# Patient Record
Sex: Female | Born: 1970 | Race: Black or African American | Hispanic: No | Marital: Single | Smoking: Never smoker
Health system: Southern US, Community
[De-identification: ages and names within clinical notes are randomized; demographics above are authoritative.]

## PROBLEM LIST (undated history)

## (undated) DIAGNOSIS — E119 Type 2 diabetes mellitus without complications: Secondary | ICD-10-CM

## (undated) DIAGNOSIS — G4733 Obstructive sleep apnea (adult) (pediatric): Secondary | ICD-10-CM

## (undated) DIAGNOSIS — K219 Gastro-esophageal reflux disease without esophagitis: Secondary | ICD-10-CM

## (undated) DIAGNOSIS — G43909 Migraine, unspecified, not intractable, without status migrainosus: Secondary | ICD-10-CM

## (undated) DIAGNOSIS — A749 Chlamydial infection, unspecified: Secondary | ICD-10-CM

## (undated) DIAGNOSIS — Z973 Presence of spectacles and contact lenses: Secondary | ICD-10-CM

## (undated) DIAGNOSIS — J302 Other seasonal allergic rhinitis: Secondary | ICD-10-CM

## (undated) DIAGNOSIS — I10 Essential (primary) hypertension: Secondary | ICD-10-CM

## (undated) DIAGNOSIS — Z9989 Dependence on other enabling machines and devices: Secondary | ICD-10-CM

## (undated) DIAGNOSIS — E785 Hyperlipidemia, unspecified: Secondary | ICD-10-CM

## (undated) HISTORY — DX: Essential (primary) hypertension: I10

## (undated) HISTORY — PX: PLANTAR FASCIA SURGERY: SHX746

## (undated) HISTORY — PX: TUBAL LIGATION: SHX77

## (undated) HISTORY — PX: FOOT SURGERY: SHX648

## (undated) HISTORY — DX: Type 2 diabetes mellitus without complications: E11.9

## (undated) HISTORY — DX: Chlamydial infection, unspecified: A74.9

## (undated) HISTORY — DX: Migraine, unspecified, not intractable, without status migrainosus: G43.909

## (undated) HISTORY — PX: DILATION AND CURETTAGE OF UTERUS: SHX78

## (undated) HISTORY — DX: Hyperlipidemia, unspecified: E78.5

## (undated) HISTORY — DX: Obstructive sleep apnea (adult) (pediatric): G47.33

## (undated) HISTORY — DX: Dependence on other enabling machines and devices: Z99.89

## (undated) HISTORY — PX: COLONOSCOPY: SHX174

---

## 1999-05-27 ENCOUNTER — Other Ambulatory Visit: Admission: RE | Admit: 1999-05-27 | Discharge: 1999-05-27 | Payer: Self-pay | Admitting: Gynecology

## 2006-06-05 ENCOUNTER — Encounter: Admission: RE | Admit: 2006-06-05 | Discharge: 2006-06-05 | Payer: Self-pay | Admitting: Occupational Medicine

## 2006-09-05 ENCOUNTER — Other Ambulatory Visit: Admission: RE | Admit: 2006-09-05 | Discharge: 2006-09-05 | Payer: Self-pay | Admitting: Family Medicine

## 2006-11-16 ENCOUNTER — Encounter: Admission: RE | Admit: 2006-11-16 | Discharge: 2006-11-16 | Payer: Self-pay | Admitting: Family Medicine

## 2007-01-16 ENCOUNTER — Encounter (HOSPITAL_COMMUNITY): Admission: RE | Admit: 2007-01-16 | Discharge: 2007-02-15 | Payer: Self-pay | Admitting: Podiatry

## 2008-04-21 ENCOUNTER — Encounter
Admission: RE | Admit: 2008-04-21 | Discharge: 2008-04-21 | Payer: Self-pay | Admitting: Physical Medicine and Rehabilitation

## 2008-09-02 ENCOUNTER — Observation Stay (HOSPITAL_COMMUNITY): Admission: EM | Admit: 2008-09-02 | Discharge: 2008-09-03 | Payer: Self-pay | Admitting: Emergency Medicine

## 2009-05-04 ENCOUNTER — Ambulatory Visit: Payer: Self-pay | Admitting: Cardiology

## 2009-05-04 ENCOUNTER — Observation Stay (HOSPITAL_COMMUNITY): Admission: EM | Admit: 2009-05-04 | Discharge: 2009-05-05 | Payer: Self-pay | Admitting: Emergency Medicine

## 2009-05-05 ENCOUNTER — Encounter: Payer: Self-pay | Admitting: Cardiology

## 2009-08-13 ENCOUNTER — Encounter: Admission: RE | Admit: 2009-08-13 | Discharge: 2009-08-13 | Payer: Self-pay | Admitting: Internal Medicine

## 2010-04-14 DIAGNOSIS — R079 Chest pain, unspecified: Secondary | ICD-10-CM

## 2010-05-17 LAB — CBC
Hemoglobin: 12 g/dL (ref 12.0–15.0)
MCHC: 32.3 g/dL (ref 30.0–36.0)
MCV: 80.5 fL (ref 78.0–100.0)
Platelets: 319 10*3/uL (ref 150–400)
RBC: 4.29 MIL/uL (ref 3.87–5.11)
RBC: 4.69 MIL/uL (ref 3.87–5.11)
RDW: 14.9 % (ref 11.5–15.5)
WBC: 10.3 10*3/uL (ref 4.0–10.5)

## 2010-05-17 LAB — BASIC METABOLIC PANEL
BUN: 11 mg/dL (ref 6–23)
CO2: 27 mEq/L (ref 19–32)
Chloride: 105 mEq/L (ref 96–112)
Creatinine, Ser: 0.88 mg/dL (ref 0.4–1.2)
GFR calc non Af Amer: >60 mL/min (ref >60)
Glucose, Bld: 117 mg/dL — ABNORMAL HIGH (ref 70–99)
Potassium: 3.7 mEq/L (ref 3.5–5.1)

## 2010-05-17 LAB — D-DIMER, QUANTITATIVE: D-Dimer, Quant: 0.25 ug/mL-FEU (ref 0.00–0.48)

## 2010-05-17 LAB — HEPARIN LEVEL (UNFRACTIONATED)
Heparin Unfractionated: 0.12 IU/mL — ABNORMAL LOW (ref 0.30–0.70)
Heparin Unfractionated: 0.25 IU/mL — ABNORMAL LOW (ref 0.30–0.70)
Heparin Unfractionated: <0.1 IU/mL — ABNORMAL LOW (ref 0.30–0.70)

## 2010-05-17 LAB — TROPONIN I: Troponin I: 0.01 ng/mL (ref 0.00–0.06)

## 2010-05-17 LAB — POCT CARDIAC MARKERS

## 2010-05-31 LAB — DIFFERENTIAL
Basophils Absolute: 0 10*3/uL (ref 0.0–0.1)
Basophils Relative: 0 % (ref 0–1)
Eosinophils Relative: 1 % (ref 0–5)
Lymphocytes Relative: 20 % (ref 12–46)
Monocytes Absolute: 0.4 10*3/uL (ref 0.1–1.0)
Neutro Abs: 8.2 10*3/uL — ABNORMAL HIGH (ref 1.7–7.7)

## 2010-05-31 LAB — POCT CARDIAC MARKERS
Myoglobin, poc: 71.8 ng/mL (ref 12–200)
Troponin i, poc: <0.05 ng/mL (ref 0.00–0.09)
Troponin i, poc: <0.05 ng/mL (ref 0.00–0.09)

## 2010-05-31 LAB — D-DIMER, QUANTITATIVE: D-Dimer, Quant: 0.33 ug/mL-FEU (ref 0.00–0.48)

## 2010-05-31 LAB — URINALYSIS, ROUTINE W REFLEX MICROSCOPIC
Bilirubin Urine: NEGATIVE
Glucose, UA: NEGATIVE mg/dL
Hgb urine dipstick: NEGATIVE
Ketones, ur: NEGATIVE mg/dL
Nitrite: NEGATIVE
Specific Gravity, Urine: 1.018 (ref 1.005–1.030)
pH: 6.5 (ref 5.0–8.0)

## 2010-05-31 LAB — PREGNANCY, URINE: Preg Test, Ur: NEGATIVE

## 2010-05-31 LAB — POCT I-STAT, CHEM 8
Calcium, Ion: 1.15 mmol/L (ref 1.12–1.32)
Chloride: 103 mEq/L (ref 96–112)
Glucose, Bld: 129 mg/dL — ABNORMAL HIGH (ref 70–99)
HCT: 43 % (ref 36.0–46.0)
Hemoglobin: 14.6 g/dL (ref 12.0–15.0)

## 2010-05-31 LAB — CBC
MCHC: 32.6 g/dL (ref 30.0–36.0)
Platelets: 314 10*3/uL (ref 150–400)
RBC: 4.97 MIL/uL (ref 3.87–5.11)

## 2010-05-31 LAB — URINE MICROSCOPIC-ADD ON

## 2011-03-08 DIAGNOSIS — R072 Precordial pain: Secondary | ICD-10-CM

## 2011-06-30 ENCOUNTER — Encounter: Payer: Self-pay | Admitting: Gastroenterology

## 2011-07-20 ENCOUNTER — Ambulatory Visit: Payer: 59 | Admitting: Gastroenterology

## 2011-07-27 ENCOUNTER — Telehealth: Payer: Self-pay | Admitting: Gastroenterology

## 2011-07-27 NOTE — Telephone Encounter (Signed)
Message copied by Arna Snipe on Wed Jul 27, 2011  8:19 AM ------      Message from: Donata Duff      Created: Wed Jul 20, 2011  3:18 PM       Do not bill

## 2011-10-21 ENCOUNTER — Encounter (HOSPITAL_COMMUNITY): Payer: Self-pay | Admitting: Pharmacist

## 2011-10-21 ENCOUNTER — Other Ambulatory Visit: Payer: Self-pay | Admitting: Obstetrics and Gynecology

## 2011-10-26 ENCOUNTER — Other Ambulatory Visit: Payer: Self-pay | Admitting: Obstetrics and Gynecology

## 2011-10-28 ENCOUNTER — Encounter (HOSPITAL_COMMUNITY): Payer: Self-pay

## 2011-10-28 ENCOUNTER — Encounter (HOSPITAL_COMMUNITY)
Admission: RE | Admit: 2011-10-28 | Discharge: 2011-10-28 | Disposition: A | Discharge status: Home / Self Care | Payer: 59 | Source: Ambulatory Visit | Attending: Obstetrics and Gynecology | Admitting: Obstetrics and Gynecology

## 2011-10-28 LAB — CBC
HCT: 37.4 % (ref 36.0–46.0)
MCV: 78.6 fL (ref 78.0–100.0)
RBC: 4.76 MIL/uL (ref 3.87–5.11)
RDW: 14.9 % (ref 11.5–15.5)
WBC: 8.3 10*3/uL (ref 4.0–10.5)

## 2011-10-28 NOTE — Patient Instructions (Addendum)
20 Bridget Anderson  10/28/2011   Your procedure is scheduled on:  10/31/11  Enter through the Main Entrance of [facility] at 1030 AM.  Pick up the phone at the desk and dial [phone].   Call this number if you have problems the morning of surgery: [phone]   Remember:   Do not eat food:After Midnight.  Do not drink clear liquids: After Midnight.  Take these medicines the morning of surgery with A SIP OF WATER: Blood pressure medication   Do not wear jewelry, make-up or nail polish.  Do not wear lotions, powders, or perfumes. You may wear deodorant.  Do not shave 48 hours prior to surgery.  Do not bring valuables to the hospital.  Contacts, dentures or bridgework may not be worn into surgery.  Leave suitcase in the car. After surgery it may be brought to your room.  For patients admitted to the hospital, checkout time is 11:00 AM the day of discharge.   Patients discharged the day of surgery will not be allowed to drive home.  Name and phone number of your driver: friend Ocie Cornfield   [phone]  Special Instructions: CHG Shower Use Special Wash: 1/2 bottle night before surgery and 1/2 bottle morning of surgery.   Please read over the following fact sheets that you were given: Surgical Site Infection Prevention

## 2011-10-31 ENCOUNTER — Ambulatory Visit (HOSPITAL_COMMUNITY)
Admission: RE | Admit: 2011-10-31 | Discharge: 2011-10-31 | Disposition: A | Discharge status: Home / Self Care | Payer: 59 | Source: Ambulatory Visit | Attending: Obstetrics and Gynecology | Admitting: Obstetrics and Gynecology

## 2011-10-31 ENCOUNTER — Encounter (HOSPITAL_COMMUNITY): Payer: Self-pay | Admitting: Anesthesiology

## 2011-10-31 ENCOUNTER — Encounter (HOSPITAL_COMMUNITY): Payer: Self-pay | Admitting: *Deleted

## 2011-10-31 ENCOUNTER — Ambulatory Visit (HOSPITAL_COMMUNITY): Payer: 59 | Admitting: Anesthesiology

## 2011-10-31 ENCOUNTER — Encounter (HOSPITAL_COMMUNITY)
Admission: RE | Disposition: A | Discharge status: Home / Self Care | Payer: Self-pay | Source: Ambulatory Visit | Attending: Obstetrics and Gynecology

## 2011-10-31 DIAGNOSIS — Z01818 Encounter for other preprocedural examination: Secondary | ICD-10-CM | POA: Insufficient documentation

## 2011-10-31 DIAGNOSIS — Z01812 Encounter for preprocedural laboratory examination: Secondary | ICD-10-CM | POA: Insufficient documentation

## 2011-10-31 DIAGNOSIS — N920 Excessive and frequent menstruation with regular cycle: Secondary | ICD-10-CM | POA: Insufficient documentation

## 2011-10-31 HISTORY — PX: HYSTEROSCOPY WITH D & C: SHX1775

## 2011-10-31 LAB — BASIC METABOLIC PANEL
CO2: 26 mEq/L (ref 19–32)
Creatinine, Ser: 0.89 mg/dL (ref 0.50–1.10)
GFR calc Af Amer: >90 mL/min (ref >90)
GFR calc non Af Amer: 79 mL/min — ABNORMAL LOW (ref >90)
Glucose, Bld: 140 mg/dL — ABNORMAL HIGH (ref 70–99)

## 2011-10-31 SURGERY — DILATATION AND CURETTAGE /HYSTEROSCOPY
Anesthesia: General

## 2011-10-31 MED ORDER — MIDAZOLAM HCL 5 MG/5ML IJ SOLN
INTRAMUSCULAR | Status: DC | PRN
  Administered 2011-10-31: 2 mg via INTRAVENOUS

## 2011-10-31 MED ORDER — FENTANYL CITRATE 0.05 MG/ML IJ SOLN
INTRAMUSCULAR | Status: DC | PRN
  Administered 2011-10-31: 100 ug via INTRAVENOUS

## 2011-10-31 MED ORDER — DEXAMETHASONE SODIUM PHOSPHATE 10 MG/ML IJ SOLN
INTRAMUSCULAR | Status: DC | PRN
  Administered 2011-10-31: 10 mg via INTRAVENOUS

## 2011-10-31 MED ORDER — LIDOCAINE HCL 1 % IJ SOLN
INTRAMUSCULAR | Status: DC | PRN
  Administered 2011-10-31: 10 mL

## 2011-10-31 MED ORDER — ONDANSETRON HCL 4 MG/2ML IJ SOLN
INTRAMUSCULAR | Status: DC | PRN
  Administered 2011-10-31: 4 mg via INTRAVENOUS

## 2011-10-31 MED ORDER — FENTANYL CITRATE 0.05 MG/ML IJ SOLN
INTRAMUSCULAR | Status: AC
  Filled 2011-10-31: qty 2

## 2011-10-31 MED ORDER — OXYCODONE-ACETAMINOPHEN 5-325 MG PO TABS
1.0000 | ORAL_TABLET | ORAL | Status: DC | PRN
  Administered 2011-10-31: 1 via ORAL

## 2011-10-31 MED ORDER — LACTATED RINGERS IV SOLN
INTRAVENOUS | Status: DC
  Administered 2011-10-31 (×2): via INTRAVENOUS

## 2011-10-31 MED ORDER — PROPOFOL 10 MG/ML IV EMUL
INTRAVENOUS | Status: AC
  Filled 2011-10-31: qty 20

## 2011-10-31 MED ORDER — KETOROLAC TROMETHAMINE 30 MG/ML IJ SOLN
INTRAMUSCULAR | Status: AC
  Filled 2011-10-31: qty 1

## 2011-10-31 MED ORDER — IPRATROPIUM BROMIDE 0.02 % IN SOLN
0.5000 mg | Freq: Once | RESPIRATORY_TRACT | Status: DC
  Filled 2011-10-31: qty 2.5

## 2011-10-31 MED ORDER — LIDOCAINE HCL (CARDIAC) 20 MG/ML IV SOLN
INTRAVENOUS | Status: DC | PRN
  Administered 2011-10-31: 80 mg via INTRAVENOUS

## 2011-10-31 MED ORDER — OXYCODONE-ACETAMINOPHEN 5-325 MG PO TABS
ORAL_TABLET | ORAL | Status: AC
  Administered 2011-10-31: 1 via ORAL
  Filled 2011-10-31: qty 1

## 2011-10-31 MED ORDER — LIDOCAINE HCL (CARDIAC) 20 MG/ML IV SOLN
INTRAVENOUS | Status: AC
  Filled 2011-10-31: qty 5

## 2011-10-31 MED ORDER — ALBUTEROL SULFATE (5 MG/ML) 0.5% IN NEBU
INHALATION_SOLUTION | RESPIRATORY_TRACT | Status: AC
  Administered 2011-10-31: 2.5 mg via RESPIRATORY_TRACT
  Filled 2011-10-31: qty 0.5

## 2011-10-31 MED ORDER — PROPOFOL 10 MG/ML IV BOLUS
INTRAVENOUS | Status: DC | PRN
  Administered 2011-10-31: 150 mg via INTRAVENOUS

## 2011-10-31 MED ORDER — ALBUTEROL SULFATE (5 MG/ML) 0.5% IN NEBU
2.5000 mg | INHALATION_SOLUTION | RESPIRATORY_TRACT | Status: DC
  Administered 2011-10-31: 2.5 mg via RESPIRATORY_TRACT

## 2011-10-31 MED ORDER — DEXAMETHASONE SODIUM PHOSPHATE 10 MG/ML IJ SOLN
INTRAMUSCULAR | Status: AC
  Filled 2011-10-31: qty 1

## 2011-10-31 MED ORDER — ONDANSETRON HCL 4 MG/2ML IJ SOLN
INTRAMUSCULAR | Status: AC
  Filled 2011-10-31: qty 2

## 2011-10-31 MED ORDER — KETOROLAC TROMETHAMINE 30 MG/ML IJ SOLN
INTRAMUSCULAR | Status: DC | PRN
  Administered 2011-10-31: 30 mg via INTRAVENOUS

## 2011-10-31 MED ORDER — MIDAZOLAM HCL 2 MG/2ML IJ SOLN
INTRAMUSCULAR | Status: AC
  Filled 2011-10-31: qty 2

## 2011-10-31 MED ORDER — IBUPROFEN 800 MG PO TABS
ORAL_TABLET | ORAL | Status: AC
  Filled 2011-10-31: qty 1

## 2011-10-31 SURGICAL SUPPLY — 17 items
CANISTER SUCTION 2500CC (MISCELLANEOUS) ×2 IMPLANT
CATH ROBINSON RED A/P 16FR (CATHETERS) ×2 IMPLANT
CLOTH BEACON ORANGE TIMEOUT ST (SAFETY) ×2 IMPLANT
CONTAINER PREFILL 10% NBF 60ML (FORM) ×4 IMPLANT
DRESSING TELFA 8X3 (GAUZE/BANDAGES/DRESSINGS) ×2 IMPLANT
ELECT REM PT RETURN 9FT ADLT (ELECTROSURGICAL) ×2
ELECTRODE REM PT RTRN 9FT ADLT (ELECTROSURGICAL) ×1 IMPLANT
GLOVE BIO SURGEON STRL SZ 6.5 (GLOVE) ×2 IMPLANT
GLOVE BIOGEL PI IND STRL 6.5 (GLOVE) ×2 IMPLANT
GLOVE BIOGEL PI INDICATOR 6.5 (GLOVE) ×2
GOWN PREVENTION PLUS LG XLONG (DISPOSABLE) ×2 IMPLANT
GOWN STRL REIN XL XLG (GOWN DISPOSABLE) ×2 IMPLANT
LOOP ANGLED CUTTING 22FR (CUTTING LOOP) IMPLANT
PACK HYSTEROSCOPY LF (CUSTOM PROCEDURE TRAY) ×2 IMPLANT
PAD OB MATERNITY 4.3X12.25 (PERSONAL CARE ITEMS) ×2 IMPLANT
TOWEL OR 17X24 6PK STRL BLUE (TOWEL DISPOSABLE) ×4 IMPLANT
WATER STERILE IRR 1000ML POUR (IV SOLUTION) ×2 IMPLANT

## 2011-10-31 NOTE — Anesthesia Preprocedure Evaluation (Signed)
Anesthesia Evaluation  Patient identified by MRN, date of birth, ID band Patient awake    Reviewed: Allergy & Precautions, H&P , NPO status , Patient's Chart, lab work & pertinent test results, reviewed documented beta blocker date and time   Airway Mallampati: II TM Distance: >3 FB Neck ROM: Full    Dental No notable dental hx. (+) Teeth Intact   Pulmonary neg pulmonary ROS,  breath sounds clear to auscultation  Pulmonary exam normal       Cardiovascular hypertension, Pt. on medications and Pt. on home beta blockers + dysrhythmias Rhythm:Regular Rate:Normal     Neuro/Psych negative neurological ROS  negative psych ROS   GI/Hepatic negative GI ROS, Neg liver ROS,   Endo/Other  Hyperlipidemia  Renal/GU   negative genitourinary   Musculoskeletal negative musculoskeletal ROS (+)   Abdominal Normal abdominal exam  (+) + obese,   Peds  Hematology negative hematology ROS (+)   Anesthesia Other Findings   Reproductive/Obstetrics negative OB ROS                           Anesthesia Physical Anesthesia Plan  ASA: II  Anesthesia Plan: General   Post-op Pain Management:    Induction: Intravenous  Airway Management Planned: LMA  Additional Equipment:   Intra-op Plan:   Post-operative Plan: Extubation in OR  Informed Consent: I have reviewed the patients History and Physical, chart, labs and discussed the procedure including the risks, benefits and alternatives for the proposed anesthesia with the patient or authorized representative who has indicated his/her understanding and acceptance.   Dental advisory given  Plan Discussed with: CRNA, Anesthesiologist and Surgeon  Anesthesia Plan Comments:         Anesthesia Quick Evaluation

## 2011-10-31 NOTE — H&P (Signed)
41 y.o. yo complains of irregular and sometimes heavy menses that has worsened over the past year.  Pt first seen 10/12 for f/u of LLQ pain evaluated in the ER.  At that time no CMT was noted and pelvic US showed multiple small fibroids and normal ovaries.  She stated that she usually has regular menses but the prior month she had 2 cycles and they were heavier with cramping.  TSH was normal. PAP done showed +CT that was treated and retest was negative.  Pt declined any further treatment for bleeding at that time, but was referred on for eval by GI for rectal bleeding for which she never followed up. She presented again 8/13 with c/o continued irregular and prolonged vaginal bleeding for several months.  Given change in menses over past year recommended D&C hysteroscopy for further eval prior to treatment.  Past Medical History  Diagnosis Date  . Chlamydia   . Hypertension   . Dysrhythmia      palpatations, seen at [facility] for BP regulation   Past Surgical History  Procedure Date  . Foot surgery   . Tubal ligation     History   Social History  . Marital Status: Single    Spouse Name: N/A    Number of Children: N/A  . Years of Education: N/A   Occupational History  . Not on file.   Social History Main Topics  . Smoking status: Never Smoker   . Smokeless tobacco: Not on file  . Alcohol Use: Yes     wine   rarely  . Drug Use: No  . Sexually Active:    Other Topics Concern  . Not on file   Social History Narrative  . No narrative on file    No current facility-administered medications on file prior to encounter.   Current Outpatient Prescriptions on File Prior to Encounter  Medication Sig Dispense Refill  . carvedilol (COREG) 12.5 MG tablet Take 12.5 mg by mouth 2 (two) times daily with a meal.      . simvastatin (ZOCOR) 20 MG tablet Take 20 mg by mouth daily.        Allergies  Allergen Reactions  . Shellfish Allergy Anaphylaxis    Allergic to some shellfish but all  regular fish.    @VITALS2 @  Lungs: clear to ascultation Cor:  RRR Abdomen:  soft, nontender, nondistended. Ex:  no cords, erythema Pelvic:  Def to OR  A: menometrorrhagia   P  All risks, benefits and alternatives d/w patient and she desires to proceed with above .  Philip Aspen

## 2011-10-31 NOTE — Progress Notes (Signed)
Patient ready to go home.  C/o tightness in chest like she needs her inhaler.  Inhaler is at home and she lives 1 hour away.  Dr Arby Barrette notified and order received.  Sats 98% on RA.  Patient given albuterol nebulizer treatment before d/c.

## 2011-10-31 NOTE — Anesthesia Postprocedure Evaluation (Signed)
  Anesthesia Post-op Note  Patient: Bridget Anderson  Procedure(s) Performed: Procedure(s) (LRB) with comments: DILATATION AND CURETTAGE /HYSTEROSCOPY (N/A)  Patient Location: PACU  Anesthesia Type: General  Level of Consciousness: awake, alert  and oriented  Airway and Oxygen Therapy: Patient Spontanous Breathing  Post-op Pain: none  Post-op Assessment: Post-op Vital signs reviewed, Patient's Cardiovascular Status Stable, Respiratory Function Stable, Patent Airway, No signs of Nausea or vomiting and Pain level controlled  Post-op Vital Signs: Reviewed and stable  Complications: No apparent anesthesia complications

## 2011-10-31 NOTE — Transfer of Care (Signed)
Immediate Anesthesia Transfer of Care Note  Patient: Bridget Anderson  Procedure(s) Performed: Procedure(s) (LRB) with comments: DILATATION AND CURETTAGE /HYSTEROSCOPY (N/A)  Patient Location: PACU  Anesthesia Type: General  Level of Consciousness: sedated  Airway & Oxygen Therapy: Patient Spontanous Breathing and Patient connected to nasal cannula oxygen  Post-op Assessment: Report given to PACU RN and Post -op Vital signs reviewed and stable  Post vital signs: stable  Complications: No apparent anesthesia complications

## 2011-11-01 ENCOUNTER — Encounter (HOSPITAL_COMMUNITY): Payer: Self-pay | Admitting: Obstetrics and Gynecology

## 2011-11-21 NOTE — Op Note (Signed)
NAME:  Bridget Anderson, Bridget Anderson                       ACCOUNT NO.:  MEDICAL RECORD NO.:  [id number]  LOCATION:                                 FACILITY:  PHYSICIAN:  Philip Aspen, DO    DATE OF BIRTH:  11-06-1970  DATE OF PROCEDURE:  11/18/2011 DATE OF DISCHARGE:                              OPERATIVE REPORT   PREOPERATIVE DIAGNOSIS:  Menorrhagia.  POSTOPERATIVE DIAGNOSIS:  Menorrhagia.  PROCEDURE:  D and C, hysteroscopy.  SURGEON:  Philip Aspen, DO  ANESTHESIA:  IV sedation with 1% lidocaine for paracervical block.  FLUIDS:  See anesthesia report for details.  ESTIMATED BLOOD LOSS:  Minimal.  FINDINGS:  Normal sized uterus with minimal-to-moderate tissue removed by curettage.  No other uterine abnormalities noted.  COMPLICATIONS:  None.  CONDITION:  Stable to PACU.  DESCRIPTION OF PROCEDURE:  The patient was taken to the operating room after procedure was discussed.  Risks, benefits, expectations, possible side effects and complications were reviewed.  The patient verified consent.  She was taken to the operating room and IV sedation was administered and found to be adequate.  She was then prepped and draped in a normal sterile fashion in the dorsal lithotomy position.  A speculum was placed and the patient's uterus in the anterior lip of cervix was grasped with single-tooth tenaculum.  The speculum was removed, and weighted speculum was placed in the posterior aspect of the vagina.  The cervix was then serially dilated to 25 Pratt and the hysteroscope was then introduced, and ostia and cavity of the uterus were observed.  Hysteroscope was removed and gentle curettage of all 4 quadrants was performed to removal of endometrial tissue that was sent to Pathology for review.  All instruments were then removed from the cervix and vagina.  Hemostasis was noted at tenaculum site.  The patient tolerated the procedure well.  Sponge, lap, and needle counts were correct x2.  The  patient was taken to recovery in stable condition.          ______________________________ Philip Aspen, DO     [place]/[facility]  D:  11/18/2011  T:  11/19/2011  Job:  [id number]

## 2011-11-29 ENCOUNTER — Other Ambulatory Visit: Payer: Self-pay

## 2012-01-04 ENCOUNTER — Other Ambulatory Visit: Payer: Self-pay | Admitting: Orthopedic Surgery

## 2012-01-12 ENCOUNTER — Encounter (HOSPITAL_BASED_OUTPATIENT_CLINIC_OR_DEPARTMENT_OTHER): Payer: Self-pay | Admitting: *Deleted

## 2012-01-12 NOTE — Progress Notes (Signed)
Pt compliant cpap-to bring dos-will use post op

## 2012-01-12 NOTE — Progress Notes (Signed)
Pt had D?C 9/13-hx palpitations-saw Dr Shirlee Latch 3/11-stress echo done-normal-meds for palpitations and htn-saw cardiology [facility] x1-no tests Will need istat

## 2012-01-16 ENCOUNTER — Encounter (HOSPITAL_BASED_OUTPATIENT_CLINIC_OR_DEPARTMENT_OTHER)
Admission: RE | Disposition: A | Discharge status: Home / Self Care | Payer: Self-pay | Source: Ambulatory Visit | Attending: Orthopedic Surgery

## 2012-01-16 ENCOUNTER — Encounter (HOSPITAL_BASED_OUTPATIENT_CLINIC_OR_DEPARTMENT_OTHER): Payer: Self-pay | Admitting: Anesthesiology

## 2012-01-16 ENCOUNTER — Ambulatory Visit (HOSPITAL_BASED_OUTPATIENT_CLINIC_OR_DEPARTMENT_OTHER)
Admission: RE | Admit: 2012-01-16 | Discharge: 2012-01-16 | Disposition: A | Discharge status: Home / Self Care | Payer: 59 | Source: Ambulatory Visit | Attending: Orthopedic Surgery | Admitting: Orthopedic Surgery

## 2012-01-16 ENCOUNTER — Encounter (HOSPITAL_BASED_OUTPATIENT_CLINIC_OR_DEPARTMENT_OTHER): Payer: Self-pay

## 2012-01-16 ENCOUNTER — Ambulatory Visit (HOSPITAL_BASED_OUTPATIENT_CLINIC_OR_DEPARTMENT_OTHER): Payer: 59 | Admitting: Anesthesiology

## 2012-01-16 DIAGNOSIS — K219 Gastro-esophageal reflux disease without esophagitis: Secondary | ICD-10-CM | POA: Insufficient documentation

## 2012-01-16 DIAGNOSIS — G5600 Carpal tunnel syndrome, unspecified upper limb: Secondary | ICD-10-CM | POA: Insufficient documentation

## 2012-01-16 DIAGNOSIS — G4730 Sleep apnea, unspecified: Secondary | ICD-10-CM | POA: Insufficient documentation

## 2012-01-16 DIAGNOSIS — I10 Essential (primary) hypertension: Secondary | ICD-10-CM | POA: Insufficient documentation

## 2012-01-16 HISTORY — DX: Other seasonal allergic rhinitis: J30.2

## 2012-01-16 HISTORY — DX: Presence of spectacles and contact lenses: Z97.3

## 2012-01-16 HISTORY — PX: CARPAL TUNNEL RELEASE: SHX101

## 2012-01-16 HISTORY — DX: Gastro-esophageal reflux disease without esophagitis: K21.9

## 2012-01-16 LAB — POCT I-STAT, CHEM 8
Chloride: 107 mEq/L (ref 96–112)
Creatinine, Ser: 0.9 mg/dL (ref 0.50–1.10)
Glucose, Bld: 89 mg/dL (ref 70–99)
Potassium: 3.9 mEq/L (ref 3.5–5.1)

## 2012-01-16 SURGERY — CARPAL TUNNEL RELEASE
Anesthesia: General | Site: Hand | Laterality: Left | Wound class: Clean

## 2012-01-16 MED ORDER — ONDANSETRON HCL 4 MG/2ML IJ SOLN
INTRAMUSCULAR | Status: DC | PRN
  Administered 2012-01-16: 4 mg via INTRAVENOUS

## 2012-01-16 MED ORDER — LACTATED RINGERS IV SOLN
INTRAVENOUS | Status: DC
  Administered 2012-01-16 (×2): via INTRAVENOUS

## 2012-01-16 MED ORDER — CEFAZOLIN SODIUM-DEXTROSE 2-3 GM-% IV SOLR
2.0000 g | INTRAVENOUS | Status: AC
  Administered 2012-01-16: 2 g via INTRAVENOUS

## 2012-01-16 MED ORDER — HYDROMORPHONE HCL PF 1 MG/ML IJ SOLN
0.2500 mg | INTRAMUSCULAR | Status: DC | PRN
  Administered 2012-01-16 (×2): 0.5 mg via INTRAVENOUS

## 2012-01-16 MED ORDER — BUPIVACAINE HCL (PF) 0.5 % IJ SOLN
INTRAMUSCULAR | Status: DC | PRN
  Administered 2012-01-16: 10 mL

## 2012-01-16 MED ORDER — DEXAMETHASONE SODIUM PHOSPHATE 10 MG/ML IJ SOLN
INTRAMUSCULAR | Status: DC | PRN
  Administered 2012-01-16: 10 mg via INTRAVENOUS

## 2012-01-16 MED ORDER — EPHEDRINE SULFATE 50 MG/ML IJ SOLN
INTRAMUSCULAR | Status: DC | PRN
  Administered 2012-01-16: 10 mg via INTRAVENOUS

## 2012-01-16 MED ORDER — ONDANSETRON HCL 4 MG/2ML IJ SOLN
4.0000 mg | Freq: Once | INTRAMUSCULAR | Status: AC
  Administered 2012-01-16: 4 mg via INTRAVENOUS

## 2012-01-16 MED ORDER — LIDOCAINE HCL (CARDIAC) 20 MG/ML IV SOLN
INTRAVENOUS | Status: DC | PRN
  Administered 2012-01-16: 60 mg via INTRAVENOUS

## 2012-01-16 MED ORDER — POVIDONE-IODINE 7.5 % EX SOLN: Freq: Once | CUTANEOUS | Status: DC

## 2012-01-16 MED ORDER — HYDROCODONE-ACETAMINOPHEN 5-325 MG PO TABS: ORAL_TABLET | ORAL | Status: DC

## 2012-01-16 MED ORDER — FENTANYL CITRATE 0.05 MG/ML IJ SOLN
INTRAMUSCULAR | Status: DC | PRN
  Administered 2012-01-16: 100 ug via INTRAVENOUS

## 2012-01-16 MED ORDER — PROPOFOL 10 MG/ML IV BOLUS
INTRAVENOUS | Status: DC | PRN
  Administered 2012-01-16: 150 mg via INTRAVENOUS

## 2012-01-16 MED ORDER — MIDAZOLAM HCL 5 MG/5ML IJ SOLN
INTRAMUSCULAR | Status: DC | PRN
  Administered 2012-01-16: 2 mg via INTRAVENOUS

## 2012-01-16 SURGICAL SUPPLY — 39 items
BANDAGE COBAN STERILE 2 (GAUZE/BANDAGES/DRESSINGS) ×2 IMPLANT
BANDAGE CONFORM 2  STR LF (GAUZE/BANDAGES/DRESSINGS) ×2 IMPLANT
BLADE SURG 15 STRL LF DISP TIS (BLADE) ×1 IMPLANT
BLADE SURG 15 STRL SS (BLADE) ×2
BNDG CMPR 9X4 STRL LF SNTH (GAUZE/BANDAGES/DRESSINGS) ×1
BNDG ESMARK 4X9 LF (GAUZE/BANDAGES/DRESSINGS) ×1 IMPLANT
CHLORAPREP W/TINT 26ML (MISCELLANEOUS) ×2 IMPLANT
CLOTH BEACON ORANGE TIMEOUT ST (SAFETY) ×2 IMPLANT
CORDS BIPOLAR (ELECTRODE) IMPLANT
COVER TABLE BACK 60X90 (DRAPES) ×2 IMPLANT
DECANTER SPIKE VIAL GLASS SM (MISCELLANEOUS) IMPLANT
DRAPE EXTREMITY T 121X128X90 (DRAPE) ×2 IMPLANT
DRAPE SURG 17X23 STRL (DRAPES) ×2 IMPLANT
DRAPE U 20/CS (DRAPES) ×2 IMPLANT
DRSG EMULSION OIL 3X3 NADH (GAUZE/BANDAGES/DRESSINGS) ×2 IMPLANT
GLOVE BIO SURGEON STRL SZ 6.5 (GLOVE) ×1 IMPLANT
GLOVE BIO SURGEON STRL SZ7 (GLOVE) ×2 IMPLANT
GLOVE BIO SURGEON STRL SZ7.5 (GLOVE) ×2 IMPLANT
GLOVE BIOGEL PI IND STRL 7.0 (GLOVE) ×1 IMPLANT
GLOVE BIOGEL PI IND STRL 8 (GLOVE) ×1 IMPLANT
GLOVE BIOGEL PI INDICATOR 7.0 (GLOVE) ×2
GLOVE BIOGEL PI INDICATOR 8 (GLOVE) ×1
GOWN PREVENTION PLUS XLARGE (GOWN DISPOSABLE) ×3 IMPLANT
GOWN PREVENTION PLUS XXLARGE (GOWN DISPOSABLE) ×2 IMPLANT
NDL HYPO 25X1 1.5 SAFETY (NEEDLE) IMPLANT
NEEDLE HYPO 25X1 1.5 SAFETY (NEEDLE) ×2 IMPLANT
NS IRRIG 1000ML POUR BTL (IV SOLUTION) ×2 IMPLANT
PACK BASIN DAY SURGERY FS (CUSTOM PROCEDURE TRAY) ×2 IMPLANT
PADDING CAST ABS 4INX4YD NS (CAST SUPPLIES) ×1
PADDING CAST ABS COTTON 4X4 ST (CAST SUPPLIES) ×1 IMPLANT
SPONGE GAUZE 4X4 12PLY (GAUZE/BANDAGES/DRESSINGS) ×2 IMPLANT
STOCKINETTE 4X48 STRL (DRAPES) ×2 IMPLANT
SUT ETHILON 4 0 PS 2 18 (SUTURE) ×2 IMPLANT
SYR BULB 3OZ (MISCELLANEOUS) ×2 IMPLANT
SYR CONTROL 10ML LL (SYRINGE) ×1 IMPLANT
TOWEL OR 17X24 6PK STRL BLUE (TOWEL DISPOSABLE) ×2 IMPLANT
TOWEL OR NON WOVEN STRL DISP B (DISPOSABLE) ×2 IMPLANT
UNDERPAD 30X30 INCONTINENT (UNDERPADS AND DIAPERS) ×2 IMPLANT
WATER STERILE IRR 1000ML POUR (IV SOLUTION) IMPLANT

## 2012-01-16 NOTE — Anesthesia Preprocedure Evaluation (Signed)
Anesthesia Evaluation  Patient identified by MRN, date of birth, ID band Patient awake    Reviewed: Allergy & Precautions, H&P , NPO status , Patient's Chart, lab work & pertinent test results, reviewed documented beta blocker date and time   Airway Mallampati: II TM Distance: >3 FB Neck ROM: Full    Dental No notable dental hx. (+) Teeth Intact and Dental Advisory Given   Pulmonary sleep apnea ,  breath sounds clear to auscultation  Pulmonary exam normal       Cardiovascular hypertension, On Home Beta Blockers + dysrhythmias Rhythm:Regular Rate:Normal     Neuro/Psych negative neurological ROS  negative psych ROS   GI/Hepatic Neg liver ROS, GERD-  Medicated and Controlled,  Endo/Other  negative endocrine ROS  Renal/GU negative Renal ROS  negative genitourinary   Musculoskeletal   Abdominal   Peds  Hematology negative hematology ROS (+)   Anesthesia Other Findings   Reproductive/Obstetrics negative OB ROS                           Anesthesia Physical Anesthesia Plan  ASA: III  Anesthesia Plan: General   Post-op Pain Management:    Induction: Intravenous  Airway Management Planned: LMA  Additional Equipment:   Intra-op Plan:   Post-operative Plan: Extubation in OR  Informed Consent: I have reviewed the patients History and Physical, chart, labs and discussed the procedure including the risks, benefits and alternatives for the proposed anesthesia with the patient or authorized representative who has indicated his/her understanding and acceptance.   Dental advisory given  Plan Discussed with: CRNA  Anesthesia Plan Comments:         Anesthesia Quick Evaluation

## 2012-01-16 NOTE — Transfer of Care (Signed)
Immediate Anesthesia Transfer of Care Note  Patient: Bridget Anderson  Procedure(s) Performed: Procedure(s) (LRB) with comments: CARPAL TUNNEL RELEASE (Left)  Patient Location: PACU  Anesthesia Type:General  Level of Consciousness: sedated  Airway & Oxygen Therapy: Patient Spontanous Breathing and Patient connected to face mask oxygen  Post-op Assessment: Report given to PACU RN and Post -op Vital signs reviewed and stable  Post vital signs: Reviewed and stable  Complications: No apparent anesthesia complications

## 2012-01-16 NOTE — Addendum Note (Signed)
Addendum  created 01/16/12 1607 by Raiford Simmonds, MD   Modules edited:Orders

## 2012-01-16 NOTE — H&P (Addendum)
Bridget Anderson is an 41 y.o. female.   Chief Complaint: L hand N/T/Pain HPI: L moderate carpal tunnel syndrome, failed conservative treatment.  Past Medical History  Diagnosis Date  . Chlamydia   . Hypertension   . Sleep apnea     uses a cpap  . Seasonal allergies   . Dysrhythmia      palpatations, seen at [facility] for BP regulation  . GERD (gastroesophageal reflux disease)   . Wears glasses     Past Surgical History  Procedure Date  . Foot surgery   . Tubal ligation   . Vaginal delivery 1995,  2003  . Hysteroscopy w/d&c 10/31/2011    Procedure: DILATATION AND CURETTAGE /HYSTEROSCOPY;  Surgeon: Philip Aspen, DO;  Location: [facility]S;  Service: Gynecology;  Laterality: N/A;  . Colonoscopy   . Dilation and curettage of uterus     History reviewed. No pertinent family history. Social History:  reports that she has never smoked. She does not have any smokeless tobacco history on file. She reports that she drinks alcohol. She reports that she does not use illicit drugs.  Allergies:  Allergies  Allergen Reactions  . Shellfish Allergy Anaphylaxis    Allergic to some shellfish but all regular fish.    Medications Prior to Admission  Medication Sig Dispense Refill  . albuterol (PROVENTIL HFA;VENTOLIN HFA) 108 (90 BASE) MCG/ACT inhaler Inhale 2 puffs into the lungs every 6 (six) hours as needed.      . budesonide-formoterol (SYMBICORT) 160-4.5 MCG/ACT inhaler Inhale 2 puffs into the lungs 2 (two) times daily.      . carvedilol (COREG) 12.5 MG tablet Take 12.5 mg by mouth 2 (two) times daily with a meal.      . montelukast (SINGULAIR) 10 MG tablet Take 10 mg by mouth at bedtime.      . simvastatin (ZOCOR) 20 MG tablet Take 20 mg by mouth daily.      . Vitamin D, Ergocalciferol, (DRISDOL) 50000 UNITS CAPS Take 50,000 Units by mouth every 7 (seven) days. Mondays        Results for orders placed during the hospital encounter of 01/16/12 (from the past 48 hour(s))  POCT I-STAT, CHEM 8      Status: Normal   Collection Time   01/16/12  1:18 PM      Component Value Range Comment   Sodium 141  135 - 145 mEq/L    Potassium 3.9  3.5 - 5.1 mEq/L    Chloride 107  96 - 112 mEq/L    BUN 13  6 - 23 mg/dL    Creatinine, Ser 0.98  0.50 - 1.10 mg/dL    Glucose, Bld 89  70 - 99 mg/dL    Calcium, Ion 1.19  1.47 - 1.23 mmol/L    TCO2 22  0 - 100 mmol/L    Hemoglobin 14.3  12.0 - 15.0 g/dL    HCT 82.9  56.2 - 13.0 %    No results found.  Review of Systems  All other systems reviewed and are negative.    Blood pressure 165/99, pulse 75, temperature 98.2 F (36.8 C), temperature source Oral, resp. rate 18, height 5\' 6"  (1.676 m), weight 97.07 kg (214 lb), last menstrual period 12/01/2011, SpO2 100.00%. Physical Exam  Constitutional: She is oriented to person, place, and time. She appears well-developed and well-nourished.  HENT:  Head: Atraumatic.  Eyes: EOM are normal.  Cardiovascular: Intact distal pulses.   Respiratory: Effort normal.  Musculoskeletal:  Pos durkins compression test L hand/wrist  Neurological: She is alert and oriented to person, place, and time.  Skin: Skin is warm and dry.  Psychiatric: She has a normal mood and affect.     Assessment/Plan L moderate carpal tunnel syndrome, failed conservative treatment. Plan L carpal tunnel release Risks / benefits of surgery discussed Consent on chart  NPO for OR Preop antibiotics   Othell Diluzio WILLIAM 01/16/2012, 1:27 PM

## 2012-01-16 NOTE — Anesthesia Procedure Notes (Signed)
Procedure Name: LMA Insertion Date/Time: 01/16/2012 2:01 PM Performed by: Burna Cash Pre-anesthesia Checklist: Patient identified, Emergency Drugs available, Suction available and Patient being monitored Patient Re-evaluated:Patient Re-evaluated prior to inductionOxygen Delivery Method: Circle System Utilized Preoxygenation: Pre-oxygenation with 100% oxygen Intubation Type: IV induction Ventilation: Mask ventilation without difficulty LMA: LMA inserted LMA Size: 4.0 Number of attempts: 1 Airway Equipment and Method: bite block Placement Confirmation: positive ETCO2 Tube secured with: Tape Dental Injury: Teeth and Oropharynx as per pre-operative assessment

## 2012-01-16 NOTE — Anesthesia Postprocedure Evaluation (Signed)
Anesthesia Post Note  Patient: Bridget Anderson  Procedure(s) Performed: Procedure(s) (LRB): CARPAL TUNNEL RELEASE (Left)  Anesthesia type: General  Patient location: PACU  Post pain: Pain level controlled and Adequate analgesia  Post assessment: Post-op Vital signs reviewed, Patient's Cardiovascular Status Stable, Respiratory Function Stable, Patent Airway and Pain level controlled  Last Vitals:  Filed Vitals:   01/16/12 1515  BP: 156/82  Pulse: 58  Temp:   Resp: 11    Post vital signs: Reviewed and stable  Level of consciousness: awake, alert  and oriented  Complications: No apparent anesthesia complications

## 2012-01-16 NOTE — Op Note (Signed)
Procedure(s): CARPAL TUNNEL RELEASE Procedure Note  Bridget Anderson female 41 y.o. 01/16/2012  Procedure(s) and Anesthesia Type:    * CARPAL TUNNEL RELEASE - General  Surgeon(s) and Role:    * Mable Paris, MD - Primary   Indications:  41 y.o. female with left carpal tunnel syndrome. The patient has findings of moderate carpal tunnel syndrome and has failed nonoperative treatment of bracing.  She did not want injections.     Surgeon: Mable Paris   Assistants: Leana Roe  Anesthesia: General endotracheal anesthesia    Procedure Detail    Findings: The transcarpal ligament was extremely tight and centrally thickened. It was completely released and at the conclusion of the procedure the edges were widely patent. The underlying nerve was healthy-appearing.  Estimated Blood Loss:  Minimal         Drains: none  Blood Given: none         Specimens: none        Complications:  * No complications entered in OR log *  Tourniquet time: Less than 15 min at 250 mmHg         Disposition: PACU - hemodynamically stable.         Condition: stable    Procedure: The patient was brought to the operating room and was placed supine on the operative table.  General anesthesia was used.  A nonsterile tourniquet was applied and the operative extremity was prepped and draped in the standard sterile fashion.  The limb was exsanguinated using an Esmarch dressing and the tourniquet was elevated to 250 mm mercury.  A 3 cm incision was made in line with the web space between the third and fourth ray extending distally from the flexion crease of the wrist. Dissection was carried down through subcutaneous fat to the palmar fascia which was opened longitudinally in line with the incision. Underlying muscle was swept off the transcarpal ligament and a small rent was made in the ligament with a 15 blade. A Freer elevator was then inserted proximally and distally to protect  the contents of the carpal canal while a 15 blade was used under direct visualization to open proximally and distally. The proximal and distal extents of the transcarpal ligament were then carefully exposed and under direct visualization completely divided using tenotomy scissors. Great care was taken to protect the underlying nerve and distally the palmar arch. The transcarpal ligament was noted to be very tight and thickened centrally.  At the conclusion of the procedure the free edges of the transcarpal ligament or widely separated.  The wound was copiously irrigated with normal saline and subsequently closed in one layer with 4-0 nylon in an interrupted fashion.  Sterile dressings were applied including Adaptic 4 x 4's Kling dressing and a lightly wrapped Coban dressing. The tourniquet was let down. The fingers were pink and warm.  The patient was then awakened transferred to a stretcher and taken to the recovery room in stable condition.  Postoperative plan: Patient will be discharged home today and will followup in 10 days for suture removal and wound check.

## 2012-01-17 ENCOUNTER — Encounter (HOSPITAL_BASED_OUTPATIENT_CLINIC_OR_DEPARTMENT_OTHER): Payer: Self-pay | Admitting: Orthopedic Surgery

## 2012-02-01 ENCOUNTER — Other Ambulatory Visit: Payer: Self-pay | Admitting: Orthopedic Surgery

## 2012-02-13 ENCOUNTER — Encounter (HOSPITAL_BASED_OUTPATIENT_CLINIC_OR_DEPARTMENT_OTHER): Payer: Self-pay | Admitting: *Deleted

## 2012-02-13 NOTE — Progress Notes (Signed)
Pt here 11/13 for lt ctr-did well-will bring cpap-knows to use it post op

## 2012-02-20 ENCOUNTER — Ambulatory Visit (HOSPITAL_BASED_OUTPATIENT_CLINIC_OR_DEPARTMENT_OTHER): Payer: 59 | Admitting: Anesthesiology

## 2012-02-20 ENCOUNTER — Encounter (HOSPITAL_BASED_OUTPATIENT_CLINIC_OR_DEPARTMENT_OTHER): Payer: Self-pay | Admitting: *Deleted

## 2012-02-20 ENCOUNTER — Encounter (HOSPITAL_BASED_OUTPATIENT_CLINIC_OR_DEPARTMENT_OTHER): Payer: Self-pay | Admitting: Anesthesiology

## 2012-02-20 ENCOUNTER — Ambulatory Visit (HOSPITAL_BASED_OUTPATIENT_CLINIC_OR_DEPARTMENT_OTHER)
Admission: RE | Admit: 2012-02-20 | Discharge: 2012-02-20 | Disposition: A | Discharge status: Home / Self Care | Payer: 59 | Source: Ambulatory Visit | Attending: Orthopedic Surgery | Admitting: Orthopedic Surgery

## 2012-02-20 ENCOUNTER — Encounter (HOSPITAL_BASED_OUTPATIENT_CLINIC_OR_DEPARTMENT_OTHER)
Admission: RE | Disposition: A | Discharge status: Home / Self Care | Payer: Self-pay | Source: Ambulatory Visit | Attending: Orthopedic Surgery

## 2012-02-20 DIAGNOSIS — I499 Cardiac arrhythmia, unspecified: Secondary | ICD-10-CM | POA: Insufficient documentation

## 2012-02-20 DIAGNOSIS — G4730 Sleep apnea, unspecified: Secondary | ICD-10-CM | POA: Insufficient documentation

## 2012-02-20 DIAGNOSIS — Z91013 Allergy to seafood: Secondary | ICD-10-CM | POA: Insufficient documentation

## 2012-02-20 DIAGNOSIS — G5600 Carpal tunnel syndrome, unspecified upper limb: Secondary | ICD-10-CM | POA: Insufficient documentation

## 2012-02-20 DIAGNOSIS — I10 Essential (primary) hypertension: Secondary | ICD-10-CM | POA: Insufficient documentation

## 2012-02-20 DIAGNOSIS — K219 Gastro-esophageal reflux disease without esophagitis: Secondary | ICD-10-CM | POA: Insufficient documentation

## 2012-02-20 HISTORY — PX: CARPAL TUNNEL RELEASE: SHX101

## 2012-02-20 LAB — POCT I-STAT, CHEM 8
Chloride: 107 mEq/L (ref 96–112)
Glucose, Bld: 113 mg/dL — ABNORMAL HIGH (ref 70–99)
HCT: 42 % (ref 36.0–46.0)
Hemoglobin: 14.3 g/dL (ref 12.0–15.0)
Potassium: 4 mEq/L (ref 3.5–5.1)
Sodium: 141 mEq/L (ref 135–145)

## 2012-02-20 SURGERY — CARPAL TUNNEL RELEASE
Anesthesia: General | Site: Wrist | Laterality: Right | Wound class: Clean

## 2012-02-20 MED ORDER — MEPERIDINE HCL 25 MG/ML IJ SOLN: 6.2500 mg | INTRAMUSCULAR | Status: DC | PRN

## 2012-02-20 MED ORDER — OXYCODONE HCL 5 MG PO TABS
5.0000 mg | ORAL_TABLET | Freq: Once | ORAL | Status: AC | PRN
  Administered 2012-02-20: 5 mg via ORAL

## 2012-02-20 MED ORDER — HYDROCODONE-ACETAMINOPHEN 5-325 MG PO TABS: ORAL_TABLET | ORAL | Status: DC

## 2012-02-20 MED ORDER — MIDAZOLAM HCL 5 MG/5ML IJ SOLN
INTRAMUSCULAR | Status: DC | PRN
  Administered 2012-02-20: 2 mg via INTRAVENOUS

## 2012-02-20 MED ORDER — ONDANSETRON HCL 4 MG/2ML IJ SOLN
INTRAMUSCULAR | Status: DC | PRN
  Administered 2012-02-20: 4 mg via INTRAVENOUS

## 2012-02-20 MED ORDER — DEXAMETHASONE SODIUM PHOSPHATE 4 MG/ML IJ SOLN
INTRAMUSCULAR | Status: DC | PRN
  Administered 2012-02-20: 10 mg via INTRAVENOUS

## 2012-02-20 MED ORDER — BUPIVACAINE HCL (PF) 0.25 % IJ SOLN
INTRAMUSCULAR | Status: DC | PRN
  Administered 2012-02-20: 10 mL

## 2012-02-20 MED ORDER — HYDROMORPHONE HCL PF 1 MG/ML IJ SOLN
0.2500 mg | INTRAMUSCULAR | Status: DC | PRN
  Administered 2012-02-20 (×2): 0.5 mg via INTRAVENOUS

## 2012-02-20 MED ORDER — OXYCODONE HCL 5 MG/5ML PO SOLN: 5.0000 mg | Freq: Once | ORAL | Status: AC | PRN

## 2012-02-20 MED ORDER — LIDOCAINE HCL (CARDIAC) 20 MG/ML IV SOLN
INTRAVENOUS | Status: DC | PRN
  Administered 2012-02-20: 50 mg via INTRAVENOUS

## 2012-02-20 MED ORDER — LACTATED RINGERS IV SOLN
INTRAVENOUS | Status: DC
  Administered 2012-02-20 (×2): via INTRAVENOUS

## 2012-02-20 MED ORDER — PROPOFOL 10 MG/ML IV BOLUS
INTRAVENOUS | Status: DC | PRN
  Administered 2012-02-20: 100 mg via INTRAVENOUS
  Administered 2012-02-20: 300 mg via INTRAVENOUS
  Administered 2012-02-20: 50 mg via INTRAVENOUS

## 2012-02-20 MED ORDER — CEFAZOLIN SODIUM-DEXTROSE 2-3 GM-% IV SOLR: 2.0000 g | INTRAVENOUS | Status: DC

## 2012-02-20 MED ORDER — POVIDONE-IODINE 7.5 % EX SOLN: Freq: Once | CUTANEOUS | Status: DC

## 2012-02-20 MED ORDER — PROMETHAZINE HCL 25 MG/ML IJ SOLN: 6.2500 mg | INTRAMUSCULAR | Status: DC | PRN

## 2012-02-20 MED ORDER — FENTANYL CITRATE 0.05 MG/ML IJ SOLN
INTRAMUSCULAR | Status: DC | PRN
  Administered 2012-02-20: 100 ug via INTRAVENOUS

## 2012-02-20 SURGICAL SUPPLY — 40 items
BANDAGE COBAN STERILE 2 (GAUZE/BANDAGES/DRESSINGS) ×2 IMPLANT
BANDAGE CONFORM 2  STR LF (GAUZE/BANDAGES/DRESSINGS) ×2 IMPLANT
BLADE SURG 15 STRL LF DISP TIS (BLADE) ×1 IMPLANT
BLADE SURG 15 STRL SS (BLADE) ×2
BNDG CMPR 9X4 STRL LF SNTH (GAUZE/BANDAGES/DRESSINGS) ×1
BNDG ESMARK 4X9 LF (GAUZE/BANDAGES/DRESSINGS) ×1 IMPLANT
CHLORAPREP W/TINT 26ML (MISCELLANEOUS) ×2 IMPLANT
CLOTH BEACON ORANGE TIMEOUT ST (SAFETY) ×2 IMPLANT
CORDS BIPOLAR (ELECTRODE) IMPLANT
COVER TABLE BACK 60X90 (DRAPES) ×2 IMPLANT
CUFF TOURNIQUET SINGLE 18IN (TOURNIQUET CUFF) ×1 IMPLANT
DECANTER SPIKE VIAL GLASS SM (MISCELLANEOUS) IMPLANT
DRAPE EXTREMITY T 121X128X90 (DRAPE) ×2 IMPLANT
DRAPE SURG 17X23 STRL (DRAPES) ×2 IMPLANT
DRAPE U 20/CS (DRAPES) ×2 IMPLANT
DRSG EMULSION OIL 3X3 NADH (GAUZE/BANDAGES/DRESSINGS) ×2 IMPLANT
GLOVE BIO SURGEON STRL SZ7 (GLOVE) ×2 IMPLANT
GLOVE BIO SURGEON STRL SZ7.5 (GLOVE) ×2 IMPLANT
GLOVE BIOGEL M STRL SZ7.5 (GLOVE) ×1 IMPLANT
GLOVE BIOGEL PI IND STRL 7.0 (GLOVE) ×1 IMPLANT
GLOVE BIOGEL PI IND STRL 8 (GLOVE) ×1 IMPLANT
GLOVE BIOGEL PI INDICATOR 7.0 (GLOVE) ×1
GLOVE BIOGEL PI INDICATOR 8 (GLOVE) ×2
GOWN PREVENTION PLUS XLARGE (GOWN DISPOSABLE) ×2 IMPLANT
GOWN PREVENTION PLUS XXLARGE (GOWN DISPOSABLE) ×3 IMPLANT
NDL HYPO 25X1 1.5 SAFETY (NEEDLE) IMPLANT
NEEDLE HYPO 25X1 1.5 SAFETY (NEEDLE) ×2 IMPLANT
NS IRRIG 1000ML POUR BTL (IV SOLUTION) ×2 IMPLANT
PACK BASIN DAY SURGERY FS (CUSTOM PROCEDURE TRAY) ×2 IMPLANT
PADDING CAST ABS 4INX4YD NS (CAST SUPPLIES) ×1
PADDING CAST ABS COTTON 4X4 ST (CAST SUPPLIES) ×1 IMPLANT
SPONGE GAUZE 4X4 12PLY (GAUZE/BANDAGES/DRESSINGS) ×2 IMPLANT
STOCKINETTE 4X48 STRL (DRAPES) ×2 IMPLANT
SUT ETHILON 4 0 PS 2 18 (SUTURE) ×2 IMPLANT
SYR BULB 3OZ (MISCELLANEOUS) ×2 IMPLANT
SYR CONTROL 10ML LL (SYRINGE) ×1 IMPLANT
TOWEL OR 17X24 6PK STRL BLUE (TOWEL DISPOSABLE) ×2 IMPLANT
TOWEL OR NON WOVEN STRL DISP B (DISPOSABLE) ×2 IMPLANT
UNDERPAD 30X30 INCONTINENT (UNDERPADS AND DIAPERS) ×2 IMPLANT
WATER STERILE IRR 1000ML POUR (IV SOLUTION) IMPLANT

## 2012-02-20 NOTE — Transfer of Care (Signed)
Immediate Anesthesia Transfer of Care Note  Patient: Bridget Anderson  Procedure(s) Performed: Procedure(s) (LRB) with comments: CARPAL TUNNEL RELEASE (Right)  Patient Location: PACU  Anesthesia Type:General  Level of Consciousness: sedated  Airway & Oxygen Therapy: Patient Spontanous Breathing and Patient connected to face mask oxygen  Post-op Assessment: Report given to PACU RN and Post -op Vital signs reviewed and stable  Post vital signs: Reviewed and stable  Complications: No apparent anesthesia complications

## 2012-02-20 NOTE — Anesthesia Preprocedure Evaluation (Signed)
Anesthesia Evaluation  Patient identified by MRN, date of birth, ID band Patient awake    Reviewed: Allergy & Precautions, H&P , NPO status , Patient's Chart, lab work & pertinent test results, reviewed documented beta blocker date and time   Airway Mallampati: II TM Distance: >3 FB Neck ROM: Full    Dental No notable dental hx. (+) Teeth Intact and Dental Advisory Given   Pulmonary sleep apnea ,  breath sounds clear to auscultation  Pulmonary exam normal       Cardiovascular hypertension, On Home Beta Blockers + dysrhythmias Rhythm:Regular Rate:Normal     Neuro/Psych negative neurological ROS  negative psych ROS   GI/Hepatic Neg liver ROS, GERD-  Medicated and Controlled,  Endo/Other  negative endocrine ROS  Renal/GU      Musculoskeletal   Abdominal   Peds  Hematology negative hematology ROS (+)   Anesthesia Other Findings   Reproductive/Obstetrics negative OB ROS                           Anesthesia Physical Anesthesia Plan  ASA: II  Anesthesia Plan: General LMA   Post-op Pain Management:    Induction:   Airway Management Planned:   Additional Equipment:   Intra-op Plan:   Post-operative Plan:   Informed Consent:   Plan Discussed with:   Anesthesia Plan Comments:         Anesthesia Quick Evaluation

## 2012-02-20 NOTE — H&P (Signed)
Bridget Anderson is an 41 y.o. female.   Chief Complaint:  N/T and pain R hand and wrist HPI: R carpal tunnel syndrome, failed conservative treatment.  Past Medical History  Diagnosis Date  . Chlamydia   . Hypertension   . Sleep apnea     uses a cpap  . Seasonal allergies   . Dysrhythmia      palpatations, seen at [facility] for BP regulation  . GERD (gastroesophageal reflux disease)   . Wears glasses     Past Surgical History  Procedure Date  . Foot surgery   . Tubal ligation   . Vaginal delivery 1995,  2003  . Hysteroscopy w/d&c 10/31/2011    Procedure: DILATATION AND CURETTAGE /HYSTEROSCOPY;  Surgeon: Philip Aspen, DO;  Location: [facility];  Service: Gynecology;  Laterality: N/A;  . Colonoscopy   . Dilation and curettage of uterus   . Carpal tunnel release 01/16/2012    Procedure: CARPAL TUNNEL RELEASE;  Surgeon: Mable Paris, MD;  Location: [facility];  Service: Orthopedics;  Laterality: Left;    History reviewed. No pertinent family history. Social History:  reports that she has never smoked. She does not have any smokeless tobacco history on file. She reports that she drinks alcohol. She reports that she does not use illicit drugs.  Allergies:  Allergies  Allergen Reactions  . Shellfish Allergy Anaphylaxis    Allergic to some shellfish but all regular fish.    Medications Prior to Admission  Medication Sig Dispense Refill  . albuterol (PROVENTIL HFA;VENTOLIN HFA) 108 (90 BASE) MCG/ACT inhaler Inhale 2 puffs into the lungs every 6 (six) hours as needed.      . budesonide-formoterol (SYMBICORT) 160-4.5 MCG/ACT inhaler Inhale 2 puffs into the lungs 2 (two) times daily.      . carvedilol (COREG) 12.5 MG tablet Take 12.5 mg by mouth 2 (two) times daily with a meal.      . montelukast (SINGULAIR) 10 MG tablet Take 10 mg by mouth at bedtime.      . simvastatin (ZOCOR) 20 MG tablet Take 20 mg by mouth daily.      Marland Kitchen HYDROcodone-acetaminophen (NORCO) 5-325 MG  per tablet 1-2 tablets every 4-6 hours as needed for pain.  30 tablet  0  . Vitamin D, Ergocalciferol, (DRISDOL) 50000 UNITS CAPS Take 50,000 Units by mouth every 7 (seven) days. Mondays        Results for orders placed during the hospital encounter of 02/20/12 (from the past 48 hour(s))  POCT I-STAT, CHEM 8     Status: Abnormal   Collection Time   02/20/12 12:45 PM      Component Value Range Comment   Sodium 141  135 - 145 mEq/L    Potassium 4.0  3.5 - 5.1 mEq/L    Chloride 107  96 - 112 mEq/L    BUN 17  6 - 23 mg/dL    Creatinine, Ser 1.91  0.50 - 1.10 mg/dL    Glucose, Bld 478 (*) 70 - 99 mg/dL    Calcium, Ion 2.95  6.21 - 1.23 mmol/L    TCO2 23  0 - 100 mmol/L    Hemoglobin 14.3  12.0 - 15.0 g/dL    HCT 30.8  65.7 - 84.6 %    No results found.  Review of Systems  All other systems reviewed and are negative.    Blood pressure 171/88, pulse 77, temperature 98.2 F (36.8 C), temperature source Oral, resp. rate 20, height  5\' 6"  (1.676 m), weight 100.699 kg (222 lb), last menstrual period 01/11/2012, SpO2 100.00%. Physical Exam  Constitutional: She is oriented to person, place, and time. She appears well-developed and well-nourished.  HENT:  Head: Atraumatic.  Eyes: EOM are normal.  Cardiovascular: Normal rate.   Respiratory: Effort normal.  Musculoskeletal:       + durkin compression test R  Neurological: She is alert and oriented to person, place, and time.  Skin: Skin is warm and dry.  Psychiatric: She has a normal mood and affect.     Assessment/Plan R carpal tunnel syndrome Plan R carpal tunnel release. Risks / benefits of surgery discussed Consent on chart  NPO for OR Preop antibiotics   Alixandrea Milleson WILLIAM 02/20/2012, 2:05 PM

## 2012-02-20 NOTE — Anesthesia Procedure Notes (Signed)
Procedure Name: LMA Insertion Date/Time: 02/20/2012 2:29 PM Performed by: Caren Macadam Pre-anesthesia Checklist: Patient identified, Emergency Drugs available, Suction available and Patient being monitored Patient Re-evaluated:Patient Re-evaluated prior to inductionOxygen Delivery Method: Circle System Utilized Preoxygenation: Pre-oxygenation with 100% oxygen Intubation Type: IV induction Ventilation: Mask ventilation without difficulty LMA: LMA inserted LMA Size: 4.0 Number of attempts: 1 Airway Equipment and Method: bite block Placement Confirmation: positive ETCO2 Tube secured with: Tape Dental Injury: Teeth and Oropharynx as per pre-operative assessment

## 2012-02-20 NOTE — Anesthesia Postprocedure Evaluation (Signed)
  Anesthesia Post-op Note  Patient: Bridget Anderson  Procedure(s) Performed: Procedure(s) (LRB) with comments: CARPAL TUNNEL RELEASE (Right)  Patient Location: PACU  Anesthesia Type:General  Level of Consciousness: awake  Airway and Oxygen Therapy: Patient Spontanous Breathing  Post-op Pain: mild  Post-op Assessment: Post-op Vital signs reviewed  Post-op Vital Signs: stable  Complications: No apparent anesthesia complications

## 2012-02-20 NOTE — Op Note (Signed)
Procedure(s): CARPAL TUNNEL RELEASE Procedure Note  TAMSEN REIST female 41 y.o. 02/20/2012  Procedure(s) and Anesthesia Type:    * CARPAL TUNNEL RELEASE - General  Surgeon(s) and Role:    * Mable Paris, MD - Primary   Indications:  41 y.o. female with right carpal tunnel syndrome. The patient has findings of moderate carpal tunnel syndrome and has failed nonoperative treatment.    Surgeon: Mable Paris   Assistants: Leana Roe  Anesthesia: General endotracheal anesthesia    Procedure Detail  CARPAL TUNNEL RELEASE  Findings: The transcarpal ligament was noted to be significantly thickened centrally. At the conclusion of the procedure the edges of the ligament or widely patent. The underlying nerve was healthy-appearing.  Estimated Blood Loss:  Minimal         Drains: none  Blood Given: none         Specimens: none        Complications:  * No complications entered in OR log *  Tourniquet time: Approximately 15 min at 250 mmHg         Disposition: PACU - hemodynamically stable.         Condition: stable    Procedure: The patient was brought to the operating room and was placed supine on the operative table.  General anesthesia was used.  A nonsterile tourniquet was applied and the operative extremity was prepped and draped in the standard sterile fashion.  The limb was exsanguinated using an Esmarch dressing and the tourniquet was elevated to 250 mm mercury.  A 3 cm incision was made in line with the web space between the third and fourth ray extending distally from the flexion crease of the wrist. Dissection was carried down through subcutaneous fat to the palmar fascia which was opened longitudinally in line with the incision. Underlying muscle was swept off the transcarpal ligament and a small rent was made in the ligament with a 15 blade. A Freer elevator was then inserted proximally and distally to protect the contents of the carpal  canal while a 15 blade was used under direct visualization to open proximally and distally. The proximal and distal extents of the transcarpal ligament were then carefully exposed and under direct visualization completely divided using tenotomy scissors. Great care was taken to protect the underlying nerve and distally the palmar arch. The transcarpal ligament was noted to be thickened centrally.  At the conclusion of the procedure the free edges of the transcarpal ligament or widely separated.  The wound was copiously irrigated with normal saline and subsequently closed in one layer with 4-0 nylon in an interrupted fashion.  Sterile dressings were applied including Adaptic 4 x 4's Kling dressing and a lightly wrapped Coban dressing. The tourniquet was let down. The fingers were pink and warm.  The patient was then awakened transferred to a stretcher and taken to the recovery room in stable condition.  Postoperative plan: Patient will be discharged home today and will followup in 10 days for suture removal and wound check.

## 2012-02-21 ENCOUNTER — Encounter (HOSPITAL_BASED_OUTPATIENT_CLINIC_OR_DEPARTMENT_OTHER): Payer: Self-pay | Admitting: Orthopedic Surgery

## 2012-10-10 ENCOUNTER — Other Ambulatory Visit: Payer: Self-pay

## 2012-11-30 ENCOUNTER — Other Ambulatory Visit (HOSPITAL_COMMUNITY): Payer: Self-pay | Admitting: Podiatry

## 2012-11-30 DIAGNOSIS — G5751 Tarsal tunnel syndrome, right lower limb: Secondary | ICD-10-CM

## 2012-12-04 ENCOUNTER — Ambulatory Visit (HOSPITAL_COMMUNITY): Payer: 59

## 2012-12-06 ENCOUNTER — Other Ambulatory Visit (HOSPITAL_COMMUNITY): Payer: Self-pay | Admitting: Podiatry

## 2012-12-06 ENCOUNTER — Ambulatory Visit (HOSPITAL_COMMUNITY)
Admission: RE | Admit: 2012-12-06 | Discharge: 2012-12-06 | Disposition: A | Discharge status: Home / Self Care | Payer: 59 | Source: Ambulatory Visit | Attending: Podiatry | Admitting: Podiatry

## 2012-12-06 DIAGNOSIS — M7730 Calcaneal spur, unspecified foot: Secondary | ICD-10-CM | POA: Insufficient documentation

## 2012-12-06 DIAGNOSIS — IMO0002 Reserved for concepts with insufficient information to code with codable children: Secondary | ICD-10-CM

## 2012-12-06 DIAGNOSIS — M25579 Pain in unspecified ankle and joints of unspecified foot: Secondary | ICD-10-CM | POA: Insufficient documentation

## 2012-12-06 DIAGNOSIS — G5751 Tarsal tunnel syndrome, right lower limb: Secondary | ICD-10-CM

## 2012-12-06 DIAGNOSIS — M7750 Other enthesopathy of unspecified foot: Secondary | ICD-10-CM | POA: Insufficient documentation

## 2013-01-24 ENCOUNTER — Ambulatory Visit: Payer: 59

## 2013-01-29 ENCOUNTER — Ambulatory Visit: Payer: 59

## 2013-02-05 ENCOUNTER — Ambulatory Visit: Payer: 59

## 2013-02-12 ENCOUNTER — Ambulatory Visit: Payer: 59

## 2013-03-01 ENCOUNTER — Encounter (HOSPITAL_COMMUNITY): Payer: Self-pay

## 2013-03-11 ENCOUNTER — Inpatient Hospital Stay (HOSPITAL_COMMUNITY): Admission: RE | Admit: 2013-03-11 | Payer: 59 | Source: Ambulatory Visit

## 2013-03-15 ENCOUNTER — Encounter (HOSPITAL_COMMUNITY): Admission: RE | Payer: Self-pay | Source: Ambulatory Visit

## 2013-03-15 ENCOUNTER — Ambulatory Visit (HOSPITAL_COMMUNITY): Admission: RE | Admit: 2013-03-15 | Payer: 59 | Source: Ambulatory Visit | Admitting: Obstetrics and Gynecology

## 2013-03-15 SURGERY — ROBOTIC ASSISTED TOTAL HYSTERECTOMY
Anesthesia: Choice

## 2013-04-29 ENCOUNTER — Other Ambulatory Visit: Payer: Self-pay | Admitting: Urology

## 2013-05-03 ENCOUNTER — Other Ambulatory Visit: Payer: Self-pay | Admitting: Urology

## 2013-06-12 ENCOUNTER — Other Ambulatory Visit: Payer: Self-pay | Admitting: Urology

## 2013-07-02 ENCOUNTER — Encounter (HOSPITAL_COMMUNITY): Payer: Self-pay | Admitting: Pharmacist

## 2013-07-04 ENCOUNTER — Encounter (HOSPITAL_COMMUNITY): Payer: Self-pay | Admitting: *Deleted

## 2013-07-08 ENCOUNTER — Encounter (HOSPITAL_COMMUNITY): Payer: Self-pay

## 2013-07-08 ENCOUNTER — Encounter (HOSPITAL_COMMUNITY)
Admission: RE | Admit: 2013-07-08 | Discharge: 2013-07-08 | Disposition: A | Discharge status: Home / Self Care | Payer: 59 | Source: Ambulatory Visit | Attending: Obstetrics and Gynecology | Admitting: Obstetrics and Gynecology

## 2013-07-08 DIAGNOSIS — Z01810 Encounter for preprocedural cardiovascular examination: Secondary | ICD-10-CM | POA: Insufficient documentation

## 2013-07-08 DIAGNOSIS — Z01812 Encounter for preprocedural laboratory examination: Secondary | ICD-10-CM | POA: Insufficient documentation

## 2013-07-08 LAB — BASIC METABOLIC PANEL
BUN: 15 mg/dL (ref 6–23)
CO2: 25 meq/L (ref 19–32)
Calcium: 8.7 mg/dL (ref 8.4–10.5)
Chloride: 103 mEq/L (ref 96–112)
Creatinine, Ser: 0.78 mg/dL (ref 0.50–1.10)
GFR calc Af Amer: >90 mL/min (ref >90)
GFR calc non Af Amer: >90 mL/min (ref >90)
GLUCOSE: 96 mg/dL (ref 70–99)
POTASSIUM: 3.9 meq/L (ref 3.7–5.3)
SODIUM: 139 meq/L (ref 137–147)

## 2013-07-08 LAB — CBC
HCT: 34.4 % — ABNORMAL LOW (ref 36.0–46.0)
HEMOGLOBIN: 10.9 g/dL — AB (ref 12.0–15.0)
MCH: 24.4 pg — AB (ref 26.0–34.0)
MCHC: 31.7 g/dL (ref 30.0–36.0)
MCV: 77 fL — AB (ref 78.0–100.0)
Platelets: 383 10*3/uL (ref 150–400)
RBC: 4.47 MIL/uL (ref 3.87–5.11)
RDW: 15.3 % (ref 11.5–15.5)
WBC: 9.5 10*3/uL (ref 4.0–10.5)

## 2013-07-08 NOTE — Anesthesia Preprocedure Evaluation (Addendum)
Anesthesia Evaluation    Airway Mallampati: III TM Distance: >3 FB Neck ROM: full   Comment: Goiter - reports difficulty breathing in certain positions (chin on chest), waking up "choking" (when not using CPAP), difficulty breathing when having palpitations, sometimes difficulty swallowing/choking which she relates to her goiter.   Dental no notable dental hx. (+) Teeth Intact   Pulmonary asthma , sleep apnea (has CPAP - inconsistent use) and Continuous Positive Airway Pressure Ventilation ,  breath sounds clear to auscultation  Pulmonary exam normal       Cardiovascular hypertension, On Home Beta Blockers and On Medications + dysrhythmias (palpitations) Rhythm:regular Rate:Normal     Neuro/Psych negative neurological ROS  negative psych ROS   GI/Hepatic Neg liver ROS, GERD-  Medicated,  Endo/Other  diabetes, Type 2, Oral Hypoglycemic AgentsObese - BMI 35.3 Goiter - followed by endocrinology, no meds  Renal/GU negative Renal ROS  Female GU complaint     Musculoskeletal   Abdominal Normal abdominal exam  (+)   Peds  Hematology negative hematology ROS (+)   Anesthesia Other Findings   Reproductive/Obstetrics negative OB ROS                         Anesthesia Physical Anesthesia Plan  ASA: III  Anesthesia Plan: General   Post-op Pain Management:    Induction:   Airway Management Planned:   Additional Equipment:   Intra-op Plan:   Post-operative Plan:   Informed Consent:   Plan Discussed with:   Anesthesia Plan Comments: (Need to contact endocrinologist for U/S results, recommendations regarding goiter/airway involvement and general anesthesia/intubation.  Patient reports being symptomatic from goiter.  Charlton Haws, MD (07/08/13)  Update: Most recent U/S of thyroid showed enlargement from previous U/S, and recommended ENT f/u.  Pt saw ENT on 07/12/13 (Dr Redmond Baseman - pager [phone]) and was  cleared for surgery.  Charlton Haws, MD (07/12/13) )       Anesthesia Quick Evaluation

## 2013-07-08 NOTE — Patient Instructions (Signed)
[place]  07/08/2013   Your procedure is scheduled on:  07/16/13  Enter through the Main Entrance of [facility] at [place] up the phone at the desk and dial [phone].   Call this number if you have problems the morning of surgery: [phone]   Remember:   Do not eat food:After Midnight.  Do not drink clear liquids: After Midnight.  Take these medicines the morning of surgery with A SIP OF WATER: take blood pressure medication, take omeprazole, hold Metformin for 24hrs prior to surgery   Do not wear jewelry, make-up or nail polish.  Do not wear lotions, powders, or perfumes. You may wear deodorant.  Do not shave 48 hours prior to surgery.  Do not bring valuables to the hospital.  [facility] is not   responsible for any belongings or valuables brought to the hospital.  Contacts, dentures or bridgework may not be worn into surgery.  Leave suitcase in the car. After surgery it may be brought to your room.  For patients admitted to the hospital, checkout time is 11:00 AM the day of              discharge.   Patients discharged the day of surgery will not be allowed to drive             home.  Name and phone number of your driver: NA  Special Instructions:      Please read over the following fact sheets that you were given:   Surgical Site Infection Prevention

## 2013-07-09 NOTE — Pre-Procedure Instructions (Signed)
Request x2 to [facility] for thyroid information and testing results per request Dr Primitivo Gauze.

## 2013-07-09 NOTE — Pre-Procedure Instructions (Signed)
Request made [facility] for Thyroid ultrasound of May 6,2015.

## 2013-07-10 NOTE — Pre-Procedure Instructions (Signed)
Pt and MD office Erline Levine) made aware of need for appt with general surgeon.

## 2013-07-10 NOTE — Pre-Procedure Instructions (Signed)
Call into MD office. VM left to advise Bridget Anderson that pt needs to be cleared via a general surgeon prior to surgery due to her enlarging thyroid nodules.

## 2013-07-10 NOTE — Pre-Procedure Instructions (Signed)
Results of 2013 and 06/2013 thyroid ultrasound reviewed by Dr Primitivo Gauze. No orders given at this time.

## 2013-07-12 NOTE — H&P (Signed)
History of Present Illness   Bridget Anderson has mixed stress urge incontinence. She has hourly frequency. She is planning a hysterectomy. She has had some abdominal pressure and heaviness and is known to have some small fibroids. She had a negative cough test and no prolapse. She has moderate nocturia but I did not think she had interstitial cystitis. She did not avoid and was catheterized for 75 mL. Maximum capacity was 200 mL. The bladder was unstable at low volumes. Maximum pressure was 12 cmH2O. She had an after contraction of 53 cmH2O after her second void. She leaked with one that was triggered with a cough, but otherwise could hold them. Her Valsalva leak-point pressure at 200 mL was 9 cmH2O and mild. There was some Valsalva induced instability. During voluntary voiding she voided 132 mL with a maximum flow of 24 mL/sec. Maximum voiding pressure was 53 cmH2O. This was the after contraction. Otherwise, voiding pressure was 21 cmH2O. EMG activity increased during the voiding phase. Bladder neck descended 2 cm. She has multiple low-pressure contractions during the filling phase. The details of the urodynamics are signed and dictated on the urodynamic sheet.   Review of Systems: No change in bowel or neurologic systems.   There was no pain on bladder filling.    Past Medical History Problems  1. History of arthritis (V13.4) 2. History of asthma (V12.69) 3. History of cardiac murmur (V12.59) 4. History of diabetes mellitus (V12.29) 5. History of gastroesophageal reflux (GERD) (V12.79) 6. History of hypercholesterolemia (V12.29) 7. History of hypertension (V12.59) 8. History of Obstructive sleep apnea, adult (327.23)  Surgical History Problems  1. History of Foot Surgery 2. History of Hand Surgery  Current Meds 1. Benazepril HCl - 20 MG Oral Tablet;  Therapy: (Recorded:16Jan2015) to Recorded 2. Carvedilol 12.5 MG Oral Tablet;  Therapy: (Recorded:16Jan2015) to Recorded 3. Glumetza 500 MG  Oral Tablet Extended Release 24 Hour;  Therapy: (Recorded:16Jan2015) to Recorded  Allergies Medication  1. No Known Drug Allergies  Family History Problems  1. Family history of hypertension (V17.49) : Mother, Father, Brother  Social History Problems  1. Alcohol use   wine once a week 2. Caffeine use (V49.89)   3-4 drinks daily 3. Non-smoker (V49.89) 4. Number of children   2 daughters 5. Occupation   Advertising account planner 6. Single  Review of Systems Constitutional, skin, eye, otolaryngeal, hematologic/lymphatic, cardiovascular, pulmonary, endocrine, musculoskeletal, gastrointestinal, neurological and psychiatric system(s) were reviewed and pertinent findings if present are noted.  Genitourinary: no incontinence.    Assessment Assessed  1. Urge and stress incontinence (788.33) 2. Increased urinary frequency (788.41)  Discussion/Summary   I drew Ms. Faller a picture. She understands she has a mild outlet abnormality. She has a moderately severe overactive bladder with frequency, nocturia, and urge incontinence. If she was not having a hysterectomy, I would certainly would recommend medical and behavioral therapy and/or physical therapy. We discussed a sling because she is having a hysterectomy.   We talked about a sling in detail. Pros, cons, general surgical and anesthetic risks, and other options including behavioral therapy and watchful waiting were discussed. She understands that slings are generally successful in 90% of cases for stress incontinence, 50% for urge incontinence, and that in a small percentage of cases the incontinence can worsen. The risk of persistent, de novo, or worsening incontinence/dysfunction was discussed. Risks were described but not limited to the discussion of injury to neighboring structures including the bowel (with possible life-threatening sepsis and colostomy), bladder, urethra, vagina (all  resulting in further surgery), and ureter (resulting in  re-implantation). We also talked about the risk of retention requiring urethrolysis, extrusion requiring revision, and erosion resulting in further surgery. Bleeding risks and transfusion rates and the risk of infection were discussed. The risk of pelvic and abdominal pain syndromes, dyspareunia, and neuropathies were discussed. The need for CIC was described as well as the usual postoperative course. The patient understands that she might not reach her treatment goal and that she might be worse following surgery. Mesh TV issues were discussed.  Persistent or worsening overactive bladder is something to discuss.   Ms. Poplin would like to proceed with a sling.   She asked me more about her left lower-quadrant pain. She understands I do not think she has interstitial cystitis, but we are going to do a hydrodistension simultaneously.  We talked about cystoscopy/hydrodistension and instillation in detail. Pros, cons, general surgical and anesthetic risks, and other options including watchful waiting were discussed. Risks were described but not limited to pain, infection, and bleeding. The risk of bladder perforation and management were discussed. The patient understands that it is primarily a diagnostic procedure.   After a thorough review of the management options for the patient's condition the patient  elected to proceed with surgical therapy as noted above. We have discussed the potential benefits and risks of the procedure, side effects of the proposed treatment, the likelihood of the patient achieving the goals of the procedure, and any potential problems that might occur during the procedure or recuperation. Informed consent has been obtained.

## 2013-07-15 MED ORDER — PHENAZOPYRIDINE HCL 200 MG PO TABS
200.0000 mg | ORAL_TABLET | Freq: Once | ORAL | Status: AC
  Administered 2013-07-16: 200 mg via ORAL
  Filled 2013-07-15: qty 1

## 2013-07-16 ENCOUNTER — Encounter (HOSPITAL_COMMUNITY): Payer: 59 | Admitting: Anesthesiology

## 2013-07-16 ENCOUNTER — Inpatient Hospital Stay (HOSPITAL_COMMUNITY)
Admission: RE | Admit: 2013-07-16 | Discharge: 2013-07-19 | DRG: 743 | Disposition: A | Discharge status: Home / Self Care | Payer: 59 | Source: Ambulatory Visit | Attending: Obstetrics and Gynecology | Admitting: Obstetrics and Gynecology

## 2013-07-16 ENCOUNTER — Encounter (HOSPITAL_COMMUNITY): Payer: Self-pay | Admitting: *Deleted

## 2013-07-16 ENCOUNTER — Ambulatory Visit (HOSPITAL_COMMUNITY): Payer: 59 | Admitting: Anesthesiology

## 2013-07-16 ENCOUNTER — Encounter (HOSPITAL_COMMUNITY)
Admission: RE | Disposition: A | Discharge status: Home / Self Care | Payer: Self-pay | Source: Ambulatory Visit | Attending: Obstetrics and Gynecology

## 2013-07-16 DIAGNOSIS — D251 Intramural leiomyoma of uterus: Secondary | ICD-10-CM | POA: Diagnosis present

## 2013-07-16 DIAGNOSIS — N8000 Endometriosis of the uterus, unspecified: Secondary | ICD-10-CM | POA: Diagnosis present

## 2013-07-16 DIAGNOSIS — I10 Essential (primary) hypertension: Secondary | ICD-10-CM | POA: Diagnosis present

## 2013-07-16 DIAGNOSIS — J45909 Unspecified asthma, uncomplicated: Secondary | ICD-10-CM | POA: Diagnosis present

## 2013-07-16 DIAGNOSIS — N949 Unspecified condition associated with female genital organs and menstrual cycle: Secondary | ICD-10-CM | POA: Diagnosis present

## 2013-07-16 DIAGNOSIS — N920 Excessive and frequent menstruation with regular cycle: Principal | ICD-10-CM | POA: Diagnosis present

## 2013-07-16 DIAGNOSIS — K219 Gastro-esophageal reflux disease without esophagitis: Secondary | ICD-10-CM | POA: Diagnosis present

## 2013-07-16 DIAGNOSIS — D252 Subserosal leiomyoma of uterus: Secondary | ICD-10-CM | POA: Diagnosis present

## 2013-07-16 DIAGNOSIS — R002 Palpitations: Secondary | ICD-10-CM | POA: Diagnosis present

## 2013-07-16 DIAGNOSIS — E669 Obesity, unspecified: Secondary | ICD-10-CM | POA: Diagnosis present

## 2013-07-16 DIAGNOSIS — N3090 Cystitis, unspecified without hematuria: Secondary | ICD-10-CM | POA: Diagnosis present

## 2013-07-16 DIAGNOSIS — Z6835 Body mass index (BMI) 35.0-35.9, adult: Secondary | ICD-10-CM

## 2013-07-16 DIAGNOSIS — N393 Stress incontinence (female) (male): Secondary | ICD-10-CM | POA: Diagnosis present

## 2013-07-16 DIAGNOSIS — E119 Type 2 diabetes mellitus without complications: Secondary | ICD-10-CM | POA: Diagnosis present

## 2013-07-16 DIAGNOSIS — Z90710 Acquired absence of both cervix and uterus: Secondary | ICD-10-CM | POA: Diagnosis present

## 2013-07-16 DIAGNOSIS — G4733 Obstructive sleep apnea (adult) (pediatric): Secondary | ICD-10-CM | POA: Diagnosis present

## 2013-07-16 HISTORY — PX: CYSTO WITH HYDRODISTENSION: SHX5453

## 2013-07-16 HISTORY — PX: BILATERAL SALPINGECTOMY: SHX5743

## 2013-07-16 HISTORY — PX: ABDOMINAL HYSTERECTOMY: SHX81

## 2013-07-16 HISTORY — PX: LAPAROSCOPY: SHX197

## 2013-07-16 HISTORY — PX: PUBOVAGINAL SLING: SHX1035

## 2013-07-16 LAB — TYPE AND SCREEN
ABO/RH(D): A POS
Antibody Screen: NEGATIVE

## 2013-07-16 LAB — PROTIME-INR
INR: 1.05 (ref 0.00–1.49)
Prothrombin Time: 13.5 seconds (ref 11.6–15.2)

## 2013-07-16 LAB — GLUCOSE, CAPILLARY
Glucose-Capillary: 125 mg/dL — ABNORMAL HIGH (ref 70–99)
Glucose-Capillary: 172 mg/dL — ABNORMAL HIGH (ref 70–99)

## 2013-07-16 LAB — APTT: APTT: 31 s (ref 24–37)

## 2013-07-16 LAB — ABO/RH: ABO/RH(D): A POS

## 2013-07-16 SURGERY — HYSTERECTOMY, ABDOMINAL
Anesthesia: General | Site: Vagina

## 2013-07-16 MED ORDER — GLYCOPYRROLATE 0.2 MG/ML IJ SOLN
INTRAMUSCULAR | Status: DC | PRN
  Administered 2013-07-16: .4 mg via INTRAVENOUS
  Administered 2013-07-16: 0.1 mg via INTRAVENOUS
  Administered 2013-07-16: .1 mg via INTRAVENOUS

## 2013-07-16 MED ORDER — CARVEDILOL 12.5 MG PO TABS
12.5000 mg | ORAL_TABLET | Freq: Two times a day (BID) | ORAL | Status: DC
  Administered 2013-07-17 – 2013-07-19 (×4): 12.5 mg via ORAL
  Filled 2013-07-16 (×6): qty 1

## 2013-07-16 MED ORDER — BUPIVACAINE HCL (PF) 0.5 % IJ SOLN
INTRAMUSCULAR | Status: AC
  Filled 2013-07-16: qty 30

## 2013-07-16 MED ORDER — LIDOCAINE-EPINEPHRINE (PF) 1 %-1:200000 IJ SOLN
INTRAMUSCULAR | Status: DC | PRN
  Administered 2013-07-16: 4 mL

## 2013-07-16 MED ORDER — GLYCOPYRROLATE 0.2 MG/ML IJ SOLN
INTRAMUSCULAR | Status: AC
  Filled 2013-07-16: qty 3

## 2013-07-16 MED ORDER — METFORMIN HCL ER 500 MG PO TB24: 500.0000 mg | ORAL_TABLET | Freq: Every day | ORAL | Status: DC

## 2013-07-16 MED ORDER — PANTOPRAZOLE SODIUM 40 MG PO TBEC
40.0000 mg | DELAYED_RELEASE_TABLET | Freq: Every day | ORAL | Status: DC
  Administered 2013-07-16: 40 mg via ORAL

## 2013-07-16 MED ORDER — IBUPROFEN 600 MG PO TABS
600.0000 mg | ORAL_TABLET | Freq: Four times a day (QID) | ORAL | Status: DC | PRN
  Administered 2013-07-17 – 2013-07-19 (×5): 600 mg via ORAL
  Filled 2013-07-16 (×6): qty 1

## 2013-07-16 MED ORDER — DEXAMETHASONE SODIUM PHOSPHATE 10 MG/ML IJ SOLN
INTRAMUSCULAR | Status: DC | PRN
  Administered 2013-07-16: 5 mg via INTRAVENOUS

## 2013-07-16 MED ORDER — MEPERIDINE HCL 25 MG/ML IJ SOLN: 6.2500 mg | INTRAMUSCULAR | Status: DC | PRN

## 2013-07-16 MED ORDER — HYDROMORPHONE HCL PF 1 MG/ML IJ SOLN
INTRAMUSCULAR | Status: AC
  Filled 2013-07-16: qty 1

## 2013-07-16 MED ORDER — FENTANYL CITRATE 0.05 MG/ML IJ SOLN
INTRAMUSCULAR | Status: DC | PRN
  Administered 2013-07-16: 100 ug via INTRAVENOUS
  Administered 2013-07-16: 50 ug via INTRAVENOUS
  Administered 2013-07-16 (×2): 100 ug via INTRAVENOUS

## 2013-07-16 MED ORDER — ONDANSETRON HCL 4 MG PO TABS: 4.0000 mg | ORAL_TABLET | Freq: Four times a day (QID) | ORAL | Status: DC | PRN

## 2013-07-16 MED ORDER — HYDROMORPHONE HCL PF 1 MG/ML IJ SOLN
1.0000 mg | INTRAMUSCULAR | Status: DC | PRN
  Administered 2013-07-16 – 2013-07-17 (×3): 1 mg via INTRAVENOUS
  Filled 2013-07-16 (×4): qty 1

## 2013-07-16 MED ORDER — ROCURONIUM BROMIDE 100 MG/10ML IV SOLN
INTRAVENOUS | Status: DC | PRN
  Administered 2013-07-16: 10 mg via INTRAVENOUS
  Administered 2013-07-16: 20 mg via INTRAVENOUS
  Administered 2013-07-16: 50 mg via INTRAVENOUS

## 2013-07-16 MED ORDER — CEFAZOLIN SODIUM-DEXTROSE 2-3 GM-% IV SOLR
INTRAVENOUS | Status: AC
  Filled 2013-07-16: qty 50

## 2013-07-16 MED ORDER — LACTATED RINGERS IV SOLN
INTRAVENOUS | Status: DC
  Administered 2013-07-16: 10:00:00 via INTRAVENOUS
  Administered 2013-07-16: 1000 mL via INTRAVENOUS
  Administered 2013-07-16: 08:00:00 via INTRAVENOUS

## 2013-07-16 MED ORDER — METFORMIN HCL ER 500 MG PO TB24
500.0000 mg | ORAL_TABLET | Freq: Every day | ORAL | Status: DC
  Administered 2013-07-17 – 2013-07-19 (×2): 500 mg via ORAL
  Filled 2013-07-16 (×4): qty 1

## 2013-07-16 MED ORDER — ONDANSETRON HCL 4 MG/2ML IJ SOLN
INTRAMUSCULAR | Status: AC
  Filled 2013-07-16: qty 2

## 2013-07-16 MED ORDER — GENTAMICIN SULFATE 40 MG/ML IJ SOLN
350.0000 mg | INTRAMUSCULAR | Status: AC
  Administered 2013-07-16: 350 mg via INTRAVENOUS
  Filled 2013-07-16: qty 8.75

## 2013-07-16 MED ORDER — SODIUM CHLORIDE 0.9 % IR SOLN
Freq: Once | Status: DC
  Filled 2013-07-16: qty 1

## 2013-07-16 MED ORDER — HYDROMORPHONE HCL PF 1 MG/ML IJ SOLN
0.2500 mg | INTRAMUSCULAR | Status: DC | PRN
  Administered 2013-07-16: 0.25 mg via INTRAVENOUS

## 2013-07-16 MED ORDER — ONDANSETRON HCL 4 MG/2ML IJ SOLN
INTRAMUSCULAR | Status: DC | PRN
Start: 2013-07-16 — End: 2013-07-16
  Administered 2013-07-16 (×2): 2 mg via INTRAVENOUS

## 2013-07-16 MED ORDER — MIDAZOLAM HCL 2 MG/2ML IJ SOLN
INTRAMUSCULAR | Status: DC | PRN
  Administered 2013-07-16: 1.5 mg via INTRAVENOUS
  Administered 2013-07-16: 0.5 mg via INTRAVENOUS

## 2013-07-16 MED ORDER — PANTOPRAZOLE SODIUM 40 MG PO TBEC
DELAYED_RELEASE_TABLET | ORAL | Status: AC
  Filled 2013-07-16: qty 1

## 2013-07-16 MED ORDER — OXYCODONE-ACETAMINOPHEN 5-325 MG PO TABS
1.0000 | ORAL_TABLET | ORAL | Status: DC | PRN
  Administered 2013-07-17 – 2013-07-18 (×3): 2 via ORAL
  Administered 2013-07-18 – 2013-07-19 (×3): 1 via ORAL
  Administered 2013-07-19: 2 via ORAL
  Filled 2013-07-16: qty 2
  Filled 2013-07-16 (×2): qty 1
  Filled 2013-07-16 (×2): qty 2
  Filled 2013-07-16: qty 1
  Filled 2013-07-16: qty 2

## 2013-07-16 MED ORDER — ROCURONIUM BROMIDE 100 MG/10ML IV SOLN
INTRAVENOUS | Status: AC
  Filled 2013-07-16: qty 1

## 2013-07-16 MED ORDER — DOCUSATE SODIUM 100 MG PO CAPS
100.0000 mg | ORAL_CAPSULE | Freq: Two times a day (BID) | ORAL | Status: DC
Start: 2013-07-16 — End: 2013-07-19
  Administered 2013-07-17 – 2013-07-19 (×4): 100 mg via ORAL
  Filled 2013-07-16 (×6): qty 1

## 2013-07-16 MED ORDER — BUPIVACAINE HCL (PF) 0.5 % IJ SOLN
INTRAMUSCULAR | Status: DC | PRN
  Administered 2013-07-16: 6 mL

## 2013-07-16 MED ORDER — FENTANYL CITRATE 0.05 MG/ML IJ SOLN
INTRAMUSCULAR | Status: AC
  Filled 2013-07-16: qty 5

## 2013-07-16 MED ORDER — CEFAZOLIN SODIUM-DEXTROSE 2-3 GM-% IV SOLR
2.0000 g | Freq: Once | INTRAVENOUS | Status: AC
  Administered 2013-07-16: 2 g via INTRAVENOUS
  Filled 2013-07-16: qty 50

## 2013-07-16 MED ORDER — ESTRADIOL 0.1 MG/GM VA CREA
TOPICAL_CREAM | VAGINAL | Status: DC | PRN
  Administered 2013-07-16: 1 via VAGINAL

## 2013-07-16 MED ORDER — PROPOFOL 10 MG/ML IV BOLUS
INTRAVENOUS | Status: DC | PRN
  Administered 2013-07-16: 150 mg via INTRAVENOUS

## 2013-07-16 MED ORDER — NEOSTIGMINE METHYLSULFATE 10 MG/10ML IV SOLN
INTRAVENOUS | Status: DC | PRN
  Administered 2013-07-16: 3 mg via INTRAVENOUS

## 2013-07-16 MED ORDER — MIDAZOLAM HCL 2 MG/2ML IJ SOLN
INTRAMUSCULAR | Status: AC
  Filled 2013-07-16: qty 2

## 2013-07-16 MED ORDER — CLINDAMYCIN PHOSPHATE 900 MG/50ML IV SOLN: 900.0000 mg | Freq: Once | INTRAVENOUS | Status: DC

## 2013-07-16 MED ORDER — CEFAZOLIN SODIUM-DEXTROSE 2-3 GM-% IV SOLR
2.0000 g | Freq: Three times a day (TID) | INTRAVENOUS | Status: DC
  Filled 2013-07-16 (×6): qty 50

## 2013-07-16 MED ORDER — STERILE WATER FOR IRRIGATION IR SOLN
Status: DC | PRN
  Administered 2013-07-16: 2 via INTRAVESICAL

## 2013-07-16 MED ORDER — KETOROLAC TROMETHAMINE 30 MG/ML IJ SOLN
15.0000 mg | Freq: Once | INTRAMUSCULAR | Status: AC | PRN
  Administered 2013-07-16: 30 mg via INTRAVENOUS

## 2013-07-16 MED ORDER — LIDOCAINE-EPINEPHRINE 1 %-1:100000 IJ SOLN
INTRAMUSCULAR | Status: AC
  Filled 2013-07-16: qty 1

## 2013-07-16 MED ORDER — PANTOPRAZOLE SODIUM 40 MG PO TBEC
40.0000 mg | DELAYED_RELEASE_TABLET | Freq: Every day | ORAL | Status: DC
  Administered 2013-07-17 – 2013-07-19 (×3): 40 mg via ORAL
  Filled 2013-07-16 (×5): qty 1

## 2013-07-16 MED ORDER — PROPOFOL 10 MG/ML IV EMUL
INTRAVENOUS | Status: AC
  Filled 2013-07-16: qty 20

## 2013-07-16 MED ORDER — BENAZEPRIL HCL 20 MG PO TABS
20.0000 mg | ORAL_TABLET | Freq: Two times a day (BID) | ORAL | Status: DC
  Administered 2013-07-16 – 2013-07-19 (×5): 20 mg via ORAL
  Filled 2013-07-16 (×9): qty 1

## 2013-07-16 MED ORDER — KETOROLAC TROMETHAMINE 30 MG/ML IJ SOLN
INTRAMUSCULAR | Status: AC
  Filled 2013-07-16: qty 1

## 2013-07-16 MED ORDER — DEXAMETHASONE SODIUM PHOSPHATE 10 MG/ML IJ SOLN
INTRAMUSCULAR | Status: AC
  Filled 2013-07-16: qty 1

## 2013-07-16 MED ORDER — LACTATED RINGERS IV SOLN
INTRAVENOUS | Status: DC
  Administered 2013-07-16 – 2013-07-18 (×6): via INTRAVENOUS

## 2013-07-16 MED ORDER — ACETAMINOPHEN 10 MG/ML IV SOLN
1000.0000 mg | Freq: Once | INTRAVENOUS | Status: AC
  Administered 2013-07-16: 1000 mg via INTRAVENOUS
  Filled 2013-07-16: qty 100

## 2013-07-16 MED ORDER — ESTRADIOL 0.1 MG/GM VA CREA
TOPICAL_CREAM | VAGINAL | Status: AC
  Filled 2013-07-16: qty 42.5

## 2013-07-16 MED ORDER — LIDOCAINE HCL (CARDIAC) 20 MG/ML IV SOLN
INTRAVENOUS | Status: AC
  Filled 2013-07-16: qty 5

## 2013-07-16 MED ORDER — METOCLOPRAMIDE HCL 5 MG/ML IJ SOLN
INTRAMUSCULAR | Status: AC
  Filled 2013-07-16: qty 2

## 2013-07-16 MED ORDER — ONDANSETRON HCL 4 MG/2ML IJ SOLN
4.0000 mg | Freq: Four times a day (QID) | INTRAMUSCULAR | Status: DC | PRN
  Administered 2013-07-17: 4 mg via INTRAVENOUS
  Filled 2013-07-16: qty 2

## 2013-07-16 MED ORDER — NEOSTIGMINE METHYLSULFATE 10 MG/10ML IV SOLN
INTRAVENOUS | Status: AC
  Filled 2013-07-16: qty 1

## 2013-07-16 MED ORDER — LIDOCAINE HCL (CARDIAC) 20 MG/ML IV SOLN
INTRAVENOUS | Status: DC | PRN
  Administered 2013-07-16: 80 mg via INTRAVENOUS

## 2013-07-16 MED ORDER — MAGNESIUM HYDROXIDE 400 MG/5ML PO SUSP: 30.0000 mL | Freq: Every day | ORAL | Status: DC | PRN

## 2013-07-16 MED ORDER — SIMETHICONE 80 MG PO CHEW
80.0000 mg | CHEWABLE_TABLET | Freq: Four times a day (QID) | ORAL | Status: DC | PRN
  Administered 2013-07-17: 80 mg via ORAL
  Filled 2013-07-16: qty 1

## 2013-07-16 MED ORDER — METOCLOPRAMIDE HCL 5 MG/ML IJ SOLN
10.0000 mg | Freq: Once | INTRAMUSCULAR | Status: AC | PRN
  Administered 2013-07-16: 10 mg via INTRAVENOUS

## 2013-07-16 MED ORDER — HYDROMORPHONE HCL PF 1 MG/ML IJ SOLN
INTRAMUSCULAR | Status: DC | PRN
  Administered 2013-07-16 (×2): 1 mg via INTRAVENOUS

## 2013-07-16 SURGICAL SUPPLY — 69 items
ADH SKN CLS APL DERMABOND .7 (GAUZE/BANDAGES/DRESSINGS) ×5
BAG URINE DRAINAGE (UROLOGICAL SUPPLIES) ×2 IMPLANT
BLADE 15 SAFETY STRL DISP (BLADE) ×6 IMPLANT
BLADE SURG 10 STRL SS (BLADE) ×4 IMPLANT
CANISTER SUCT 3000ML (MISCELLANEOUS) ×6 IMPLANT
CATH FOLEY 2WAY SLVR  5CC 16FR (CATHETERS) ×1
CATH FOLEY 2WAY SLVR 5CC 16FR (CATHETERS) ×5 IMPLANT
CHLORAPREP W/TINT 26ML (MISCELLANEOUS) ×6 IMPLANT
CLOTH BEACON ORANGE TIMEOUT ST (SAFETY) ×6 IMPLANT
COUNTER NEEDLE 1200 MAGNETIC (NEEDLE) ×2 IMPLANT
COVER MAYO STAND STRL (DRAPES) ×6 IMPLANT
COVER TABLE BACK 60X90 (DRAPES) ×8 IMPLANT
DECANTER SPIKE VIAL GLASS SM (MISCELLANEOUS) ×2 IMPLANT
DERMABOND ADVANCED (GAUZE/BANDAGES/DRESSINGS) ×1
DERMABOND ADVANCED .7 DNX12 (GAUZE/BANDAGES/DRESSINGS) ×5 IMPLANT
DRAPE WARM FLUID 44X44 (DRAPE) IMPLANT
DRSG OPSITE POSTOP 4X10 (GAUZE/BANDAGES/DRESSINGS) ×2 IMPLANT
ELECT BLADE 6.5 EXT (BLADE) ×2 IMPLANT
ELECT REM PT RETURN 9FT ADLT (ELECTROSURGICAL) ×6
ELECTRODE REM PT RTRN 9FT ADLT (ELECTROSURGICAL) ×5 IMPLANT
EVACUATOR SMOKE 8.L (FILTER) ×6 IMPLANT
GAUZE PACKING 1 X5 YD ST (GAUZE/BANDAGES/DRESSINGS) IMPLANT
GAUZE PACKING 2X5 YD STRL (GAUZE/BANDAGES/DRESSINGS) ×2 IMPLANT
GLOVE BIO SURGEON STRL SZ 6.5 (GLOVE) ×12 IMPLANT
GLOVE BIO SURGEON STRL SZ7.5 (GLOVE) ×6 IMPLANT
GLOVE BIOGEL PI IND STRL 6.5 (GLOVE) ×5 IMPLANT
GLOVE BIOGEL PI IND STRL 8 (GLOVE) ×10 IMPLANT
GLOVE BIOGEL PI INDICATOR 6.5 (GLOVE) ×1
GLOVE BIOGEL PI INDICATOR 8 (GLOVE) ×2
GOWN STRL REUS W/TWL LRG LVL3 (GOWN DISPOSABLE) ×24 IMPLANT
NDL SAFETY ECLIPSE 18X1.5 (NEEDLE) ×5 IMPLANT
NEEDLE HYPO 18GX1.5 SHARP (NEEDLE) ×6
NEEDLE HYPO 22GX1.5 SAFETY (NEEDLE) ×6 IMPLANT
NEEDLE INSUFFLATION 120MM (ENDOMECHANICALS) ×6 IMPLANT
NS IRRIG 1000ML POUR BTL (IV SOLUTION) ×12 IMPLANT
PACK ABDOMINAL GYN (CUSTOM PROCEDURE TRAY) ×6 IMPLANT
PACK LAVH (CUSTOM PROCEDURE TRAY) ×8 IMPLANT
PLUG CATH AND CAP STER (CATHETERS) ×6 IMPLANT
PROTECTOR NERVE ULNAR (MISCELLANEOUS) ×12 IMPLANT
SET CYSTO W/LG BORE CLAMP LF (SET/KITS/TRAYS/PACK) ×6 IMPLANT
SLING SYSTEM SPARC (Sling) ×2 IMPLANT
SPONGE GAUZE 4X4 12PLY STER LF (GAUZE/BANDAGES/DRESSINGS) ×12 IMPLANT
SPONGE SURGIFOAM ABS GEL 12-7 (HEMOSTASIS) ×2 IMPLANT
STAPLER VISISTAT 35W (STAPLE) ×2 IMPLANT
SUT PDS AB 0 CTX 60 (SUTURE) ×4 IMPLANT
SUT VIC AB 0 CT1 18XCR BRD8 (SUTURE) ×4 IMPLANT
SUT VIC AB 0 CT1 8-18 (SUTURE) ×24
SUT VIC AB 2-0 CT1 (SUTURE) ×2 IMPLANT
SUT VIC AB 2-0 CT1 27 (SUTURE) ×12
SUT VIC AB 2-0 CT1 TAPERPNT 27 (SUTURE) ×2 IMPLANT
SUT VIC AB 2-0 CT2 27 (SUTURE) ×2 IMPLANT
SUT VIC AB 2-0 SH 27 (SUTURE) ×12
SUT VIC AB 2-0 SH 27XBRD (SUTURE) ×2 IMPLANT
SUT VIC AB 4-0 PS2 27 (SUTURE) ×6 IMPLANT
SUT VICRYL 0 TIES 12 18 (SUTURE) ×6 IMPLANT
SUT VICRYL 0 UR6 27IN ABS (SUTURE) ×8 IMPLANT
SUT VICRYL RAPIDE 3 0 (SUTURE) ×8 IMPLANT
SYR 20CC LL (SYRINGE) ×6 IMPLANT
SYR BULB IRRIGATION 50ML (SYRINGE) ×2 IMPLANT
TOWEL OR 17X24 6PK STRL BLUE (TOWEL DISPOSABLE) ×24 IMPLANT
TRAY FOLEY CATH 14FR (SET/KITS/TRAYS/PACK) ×12 IMPLANT
TROCAR OPTI TIP 5M 100M (ENDOMECHANICALS) ×6 IMPLANT
TROCAR XCEL DIL TIP R 11M (ENDOMECHANICALS) ×8 IMPLANT
TROCAR XCEL NON-BLD 11X100MML (ENDOMECHANICALS) ×6 IMPLANT
TROCAR XCEL NON-BLD 5MMX100MML (ENDOMECHANICALS) ×8 IMPLANT
TUBING CONNECTING 10 (TUBING) ×6 IMPLANT
TUBING NON-CON 1/4 X 20 CONN (TUBING) ×6 IMPLANT
WARMER LAPAROSCOPE (MISCELLANEOUS) ×6 IMPLANT
WATER STERILE IRR 1000ML POUR (IV SOLUTION) ×12 IMPLANT

## 2013-07-16 NOTE — Brief Op Note (Signed)
07/16/2013  12:59 PM  PATIENT:  Bridget Anderson  43 y.o. female  PRE-OPERATIVE DIAGNOSIS:  MENORRHAGIA  POST-OPERATIVE DIAGNOSIS:  MENORRHAGIA  PROCEDURE:  Procedure(s): CYSTOSCOPY/HYDRODISTENSION (N/A) PUBO-VAGINAL SLING (N/A) HYSTERECTOMY ABDOMINAL (N/A) BILATERAL SALPINGECTOMY (Bilateral) LAPAROSCOPY DIAGNOSTIC (N/A)  SURGEON:  Surgeon(s) and Role: Panel 1:    * Allyn Kenner, DO - Primary    * Daria Pastures, MD - Assisting  Panel 2:    * Reece Packer, MD - Primary  ANESTHESIA:   local and general  EBL:  Total I/O In: 2650 [I.V.:2650] Out: 900 [Urine:350; Blood:550]  BLOOD ADMINISTERED:none  LOCAL MEDICATIONS USED:  MARCAINE     SPECIMEN:  Source of Specimen:  uterus, bilateral tubes, cervix  DISPOSITION OF SPECIMEN:  PATHOLOGY  COUNTS:  YES  PLAN OF CARE: Admit to inpatient   PATIENT DISPOSITION:  PACU - hemodynamically stable.   Delay start of Pharmacological VTE agent (>24hrs) due to surgical blood loss or risk of bleeding: not applicable

## 2013-07-16 NOTE — H&P (Signed)
43 y.o. complains of menorrhagia, pelvic pain and urinary incontinence. [place] 11/2010 showed 4 small fibriods, repeat 10/2011 stable, repeat 10/2012 stable with 5th smll fibroid, all measuring 1-3.5cm, uterus measuring 9.8 x 6.9 x 6.4cm. D&C performed 11/2012- benign path, repeat EMBx 02/2013: benign sectretory endometrium.  Incontinence complaints evaluated by Dr. Wendy Poet.  Past Medical History  Diagnosis Date  . Chlamydia   . Hypertension   . Sleep apnea     uses a cpap  . Seasonal allergies   . GERD (gastroesophageal reflux disease)   . Wears glasses   . Dysrhythmia      palpatations, seen at [facility] for BP regulation  thyroid goiter: 02/2013: TSH normal, FT4 slightly low, referred to Dr. Chalmers Cater, US showed small nodules, repeat showed slight enlargement.  Dr. Chalmers Cater cleared for surgery.  Gen surgery referral made pre-op to assess airway.  Dr. Redmond Baseman evaluated and detemined pt would have not problems with planned surgery/intubation. DM: dx 10/14: followed by Dr. Chalmers Cater, started on metformin, switched to [place]. Per Dr. Chalmers Cater, cleared for surgery Past Surgical History  Procedure Laterality Date  . Foot surgery    . Tubal ligation    . Vaginal delivery  1995,  2003  . Hysteroscopy w/d&c  10/31/2011    Procedure: DILATATION AND CURETTAGE /HYSTEROSCOPY;  Surgeon: Allyn Kenner, DO;  Location: [facility];  Service: Gynecology;  Laterality: N/A;  . Colonoscopy    . Dilation and curettage of uterus    . Carpal tunnel release  01/16/2012    Procedure: CARPAL TUNNEL RELEASE;  Surgeon: Nita Sells, MD;  Location: [place];  Service: Orthopedics;  Laterality: Left;  . Carpal tunnel release  02/20/2012    Procedure: CARPAL TUNNEL RELEASE;  Surgeon: Nita Sells, MD;  Location: [place];  Service: Orthopedics;  Laterality: Right;    History   Social History  . Marital Status: Single    Spouse Name: N/A    Number of Children: N/A  . Years of  Education: N/A   Occupational History  . Not on file.   Social History Main Topics  . Smoking status: Never Smoker   . Smokeless tobacco: Not on file  . Alcohol Use: Yes     Comment: wine   rarely  . Drug Use: No  . Sexual Activity: Not on file   Other Topics Concern  . Not on file   Social History Narrative  . No narrative on file    No current facility-administered medications on file prior to encounter.   Current Outpatient Prescriptions on File Prior to Encounter  Medication Sig Dispense Refill  . albuterol (PROVENTIL HFA;VENTOLIN HFA) 108 (90 BASE) MCG/ACT inhaler Inhale 2 puffs into the lungs every 6 (six) hours as needed.      . carvedilol (COREG) 12.5 MG tablet Take 12.5 mg by mouth 2 (two) times daily with a meal.      . budesonide-formoterol (SYMBICORT) 160-4.5 MCG/ACT inhaler Inhale 2 puffs into the lungs 2 (two) times daily.        Allergies  Allergen Reactions  . Shellfish Allergy Anaphylaxis    Allergic to some shellfish but all regular fish.    @VITALS2 @  Lungs: clear to ascultation Cor:  RRR Abdomen:  soft, nontender, nondistended. Ex:  no cords, erythema Pelvic:  Def to OR  A:  Admit  Surgical tx of menorrhagia, pelvic pain and incontinence (inconyinence portion by Dr. McDiarmid)   P:  LAVH, bilateral salpingetomy, possible exploratory laparotomy,  cystoscopy.   All risks, benefits and alternatives d/w patient and she desires to proceed.  Patient has undergone a modified bowel prep and will receive preop antibiotics and SCDs during the operation.   Please see Dr. McDiarmid's note.   Gentamicin ordered by Dr. Wendy Poet, will add Clindamycin 900mg  IV once pre-op.  Allyn Kenner

## 2013-07-16 NOTE — Op Note (Signed)
Preoperative diagnosis: Stress urinary incontinence and pelvic pain Postoperative diagnosis: Stress urinary incontinence and mildly inflammed bladder Procedure: Sling cystourethropexy [facility]) and cystoscopy and hydrodistension Surgeon: Dr. Bjorn Loser Estimated blood loss: < 50 milliliters   The patient was prepped and draped in the usual fashion by Gynecology who performed a hysterectomy. Extra care was taken with leg positioning to minimize the risk of compartment syndrome, neuropathy, and deep vein thrombosis. Preoperative laboratory tests were normal and preoperative antibiotics were given.  I cystoscoped the patient and there was no bladder injury and excellent effux x 2.  Two less than 1 cm incisions were made 1 fingerbreadth above the symphysis pubis 1.5 cm lateral to the midline. A 2 cm appropriate depth suburethral incision was made underneath the mid urethra after instilling approximately 5 cc of 1% lidocaine mixture. I sharply and bluntly dissected to the urethral vesical angle bilaterally.  With the bladder empty I passed a SPARC needle on top of and along the back of the symphysis pubis staying  on the periosteum and staying lateral using my box technique and delivering the needle onto the pulp of my  index finger bilaterally.  I cystoscoped the patient thoroughly and there was no injury to the bladder or urethra. There was no movement  or indentation of the bladder with movement of the trocar. There was excellent efflux of blue urine bilaterally.  With the bladder emptied I attached the [facility] sling and brought it up through the retropubic space bilaterally. I tensioned it over the fat part of a moderate size Kelly clamp. I cut below the blue dots, irrigated the sheaths, and removed the sheaths. I was very happy with the position and tension of the sling With appropriate hypermobility and no springback effect.  All incisions were irrigated. The sling was cut below the  skin level. I closed the anterior vaginal wall with  running 2-0 Vicryl followed by my interrupted sutures. Interrupted 4-0 Vicryl was used for the abdominal incisions.  Patient' bladder was hydrodistended to 450 mL. The bladder was emptied. On reinspection she had a few glomerulations at 5 and 7:00 next to the trigone and a few along the left and right lateral wall. There were no ulcers. The findings were minimal but may be clinically relevant  A catheter used as well as a vaginal pack.   The patient was taken to the recovery room and hopefully this procedure will reach her treatment goal. I will entertain a diagnosis of IC if she continues to have pelvic pain after hysterectomy for fibroids

## 2013-07-16 NOTE — Progress Notes (Signed)
UR chart review completed.  

## 2013-07-16 NOTE — Addendum Note (Signed)
Addendum created 07/16/13 1905 by Flossie Dibble, CRNA   Modules edited: Notes Section   Notes Section:  File: [id number]

## 2013-07-16 NOTE — Progress Notes (Signed)
Placed pt on nasal CPAP +5 per MD verbal order. Pt is post op and tolerating well at these settings. Pt not able to tell RT her home settings at this time. RT will monitor.

## 2013-07-16 NOTE — Anesthesia Postprocedure Evaluation (Signed)
Anesthesia Post Note  Patient: Bridget Anderson  Procedure(s) Performed: Procedure(s): CYSTOSCOPY/HYDRODISTENSION (N/A) PUBO-VAGINAL SLING (N/A) HYSTERECTOMY ABDOMINAL (N/A) BILATERAL SALPINGECTOMY (Bilateral) LAPAROSCOPY DIAGNOSTIC (N/A)  Anesthesia type: General  Patient location: [facility]  Post pain: Pain level controlled  Post assessment: Post-op Vital signs reviewed  Last Vitals: BP 136/72  Pulse 61  Temp(Src) 36.8 C (Oral)  Resp 12  Ht 5\' 6"  (1.676 m)  Wt 218 lb (98.884 kg)  BMI 35.20 kg/m2  SpO2 99%  Post vital signs: Reviewed  Level of consciousness: awake  Complications: No apparent anesthesia complications

## 2013-07-16 NOTE — Anesthesia Postprocedure Evaluation (Signed)
  Anesthesia Post-op Note  Anesthesia Post Note  Patient: Bridget Anderson  Procedure(s) Performed: Procedure(s) (LRB): CYSTOSCOPY/HYDRODISTENSION (N/A) PUBO-VAGINAL SLING (N/A) HYSTERECTOMY ABDOMINAL (N/A) BILATERAL SALPINGECTOMY (Bilateral) LAPAROSCOPY DIAGNOSTIC (N/A)  Anesthesia type: General  Patient location: PACU  Post pain: Pain level controlled  Post assessment: Post-op Vital signs reviewed  Last Vitals:  Filed Vitals:   07/16/13 1200  BP: 129/71  Pulse: 62  Temp:   Resp:     Post vital signs: Reviewed  Level of consciousness: sedated  Complications: No apparent anesthesia complications

## 2013-07-16 NOTE — Transfer of Care (Signed)
Immediate Anesthesia Transfer of Care Note  Patient: Bridget Anderson  Procedure(s) Performed: Procedure(s): CYSTOSCOPY/HYDRODISTENSION (N/A) PUBO-VAGINAL SLING (N/A) HYSTERECTOMY ABDOMINAL (N/A) BILATERAL SALPINGECTOMY (Bilateral) LAPAROSCOPY DIAGNOSTIC (N/A)  Patient Location: PACU  Anesthesia Type:General  Level of Consciousness: awake, alert  and oriented  Airway & Oxygen Therapy: Patient Spontanous Breathing and Patient connected to nasal cannula oxygen  Post-op Assessment: Report given to PACU RN, Post -op Vital signs reviewed and stable and Patient moving all extremities X 4  Post vital signs: Reviewed and stable  Complications: No apparent anesthesia complications

## 2013-07-17 ENCOUNTER — Encounter (HOSPITAL_COMMUNITY): Payer: Self-pay | Admitting: Obstetrics and Gynecology

## 2013-07-17 LAB — CBC
HCT: 29.9 % — ABNORMAL LOW (ref 36.0–46.0)
HEMOGLOBIN: 9.3 g/dL — AB (ref 12.0–15.0)
MCH: 24 pg — ABNORMAL LOW (ref 26.0–34.0)
MCHC: 31.1 g/dL (ref 30.0–36.0)
MCV: 77.3 fL — ABNORMAL LOW (ref 78.0–100.0)
Platelets: 356 10*3/uL (ref 150–400)
RBC: 3.87 MIL/uL (ref 3.87–5.11)
RDW: 15.4 % (ref 11.5–15.5)
WBC: 18.3 10*3/uL — ABNORMAL HIGH (ref 4.0–10.5)

## 2013-07-17 LAB — GLUCOSE, CAPILLARY
GLUCOSE-CAPILLARY: 132 mg/dL — AB (ref 70–99)
Glucose-Capillary: 117 mg/dL — ABNORMAL HIGH (ref 70–99)

## 2013-07-17 NOTE — Progress Notes (Signed)
Spoke to pt re sling Abdominal incisional pain but ambulating Vitals normal WBC elevated We decided to leave foley in one extra day so pt can rest, etc Will do trial of voiding tomorrow am May delete vaginal pack

## 2013-07-17 NOTE — Progress Notes (Signed)
Patient is eating, ambulating, foley in place, to be removed tomorrow.  No CP/SOB.  Abd tender, no flatus.  Tolerating po pain meds  Filed Vitals:   07/16/13 1812 07/16/13 2220 07/17/13 0145 07/17/13 0544  BP: 136/72 145/75 130/68 135/67  Pulse: 61 65 69 76  Temp: 98.2 F (36.8 C) 98.1 F (36.7 C) 98 F (36.7 C) 98 F (36.7 C)  TempSrc: Oral Oral Oral Oral  Resp: 12 16 18 18   Height:      Weight:      SpO2: 99% 94% 99% 98%   Abd: soft, tender Inc: c/d/i Ext: SCDs in place  Lab Results  Component Value Date   WBC 18.3* 07/17/2013   HGB 9.3* 07/17/2013   HCT 29.9* 07/17/2013   MCV 77.3* 07/17/2013   PLT 356 07/17/2013    --/--/A POS, A POS (05/26 6073)  A/P Post op day #1 s/p TAH, BS, TVT, cysto Foley removal per Dr. McDiarimid Advance diet as tolerated, po pain meds Encourage OOB and incentive spiro  Routine care.     Allyn Kenner

## 2013-07-17 NOTE — Progress Notes (Signed)
Late Entry from 07/16/13 approx 5pm Pt groggy but communicative.  Pain controlled with IV pain medication.  Post-op nausea.  Denies any CP/SOB. Abd: soft, incision: c/d/i Ext: SCDs in place A/P: POD#0 s/p TAH BS and TVT, cysto IV pain med until tolerating PO Instructed in incentive spiro OOB walking  Other current routine care

## 2013-07-18 NOTE — Progress Notes (Addendum)
Patient is eating, ambulating, on a limited basis, foley catheter came out this am, voided in shower so not collected.  No flatus, no bleeding. Pain controlled with po pain meds  Filed Vitals:   07/17/13 1400 07/17/13 1700 07/17/13 2130 07/18/13 0545  BP: 138/62 138/73 127/60 149/70  Pulse: 68 67 62 98  Temp: 97.7 F (36.5 C) 98.4 F (36.9 C) 97.8 F (36.6 C) 98 F (36.7 C)  TempSrc: Oral Oral Oral Oral  Resp: 18 16 16 18   Height:      Weight:      SpO2: 98% 97% 99% 100%   Abd: soft, appropriately tender Inc: c/d/i Ext: no CT  Lab Results  Component Value Date   WBC 18.3* 07/17/2013   HGB 9.3* 07/17/2013   HCT 29.9* 07/17/2013   MCV 77.3* 07/17/2013   PLT 356 07/17/2013    A/P POD#2 s/p LAVH converted to TAH-BS for menorrhagia and fibroids, with TVT, cysto performed by Dr. McDiarimid Tolerating limited solid po, still with decreased appetite, encourage ambulation in halls today, encouraged incentive spiro as well.   HTN- Continue home pain meds DM - cont glumetza Foley out this am, voided but not collected, has been instructed in self-cath for d/c home if needed Discussed d/c - will stay one more night, staple removal prior to discharge, pt has post op appt in office Ibuprofen, Percocet for pain, Rx Perc needed at d/c.  Allyn Kenner

## 2013-07-18 NOTE — Progress Notes (Signed)
Looks good Voiding trial Call tomorrow

## 2013-07-19 LAB — GLUCOSE, CAPILLARY: Glucose-Capillary: 93 mg/dL (ref 70–99)

## 2013-07-19 MED ORDER — OXYCODONE-ACETAMINOPHEN 5-325 MG PO TABS: 2.0000 | ORAL_TABLET | ORAL | Status: DC | PRN

## 2013-07-19 MED ORDER — DOCUSATE SODIUM 100 MG PO CAPS: 100.0000 mg | ORAL_CAPSULE | Freq: Two times a day (BID) | ORAL | Status: DC

## 2013-07-19 MED ORDER — BISACODYL 10 MG RE SUPP
10.0000 mg | Freq: Once | RECTAL | Status: AC
  Administered 2013-07-19: 10 mg via RECTAL
  Filled 2013-07-19: qty 1

## 2013-07-19 MED ORDER — IBUPROFEN 600 MG PO TABS: 600.0000 mg | ORAL_TABLET | Freq: Four times a day (QID) | ORAL | Status: DC | PRN

## 2013-07-19 NOTE — Progress Notes (Signed)
3 Days Post-Op Procedure(s) (LRB): CYSTOSCOPY/HYDRODISTENSION (N/A) PUBO-VAGINAL SLING (N/A) HYSTERECTOMY ABDOMINAL (N/A) BILATERAL SALPINGECTOMY (Bilateral) LAPAROSCOPY DIAGNOSTIC (N/A)  Subjective: Patient reports still having difficulty with passing gas. Had a small amount of flatus yesterday. Tolerating po, no nausea or vomiting, feels distended and gassy. Pain controlled.   Objective: Filed Vitals:   07/18/13 1752 07/18/13 2134 07/19/13 0550 07/19/13 1000  BP: 140/89 132/60 130/81 173/86  Pulse: 73 65 57 63  Temp: 98.3 F (36.8 C) 98.1 F (36.7 C) 97.7 F (36.5 C) 98.1 F (36.7 C)  TempSrc: Oral Oral Oral Oral  Resp: 16 18 18 18   Height:      Weight:      SpO2: 99% 98% 98% 100%     General: alert, cooperative and appears stated age abd distended, hypoactive BS but present Incision dressing intact  Assessment: s/p Procedure(s): CYSTOSCOPY/HYDRODISTENSION (N/A) PUBO-VAGINAL SLING (N/A) HYSTERECTOMY ABDOMINAL (N/A) BILATERAL SALPINGECTOMY (Bilateral) LAPAROSCOPY DIAGNOSTIC (N/A):   Plan: Will plan dulcolax suppository and if this is successful then patient can be d/cd home  LOS: 3 days    Kassim Guertin H. Natanya Holecek 07/19/2013, 10:15 AM

## 2013-07-19 NOTE — Progress Notes (Signed)
Staples removed per MD order... Steri strips and new honeycomb placed.

## 2013-07-19 NOTE — Progress Notes (Signed)
Pt discharged home with mother... Condition stable... No equipment... Taken to car via wheelchair by T. Corbitt, Therapist, sports.

## 2013-08-02 NOTE — Op Note (Signed)
NAME:  Bridget Anderson, Bridget Anderson                       ACCOUNT NO.:  MEDICAL RECORD NO.:  [id number]  LOCATION:                                 FACILITY:  PHYSICIAN:  Allyn Kenner, DO    DATE OF BIRTH:  05-Jun-1970  DATE OF PROCEDURE: DATE OF DISCHARGE:                              OPERATIVE REPORT   PREOPERATIVE DIAGNOSIS:  Menorrhagia.  POSTOPERATIVE DIAGNOSIS:  Menorrhagia.  PROCEDURES:  Diagnostic laparoscopy, abdominal hysterectomy, bilateral salpingectomy, and additionally pubovaginal sling, cystoscopy with hydrodistention, performed by Reece Packer, M.D.  SURGEON:  Allyn Kenner, D.O.  ASSISTANT:  Bobbye Charleston, M.D.; urologic portion, Reece Packer, M.D.  ANESTHESIA:  Local and general.  IV FLUIDS:  2650.  URINE OUTPUT:  350.  ESTIMATED BLOOD LOSS:  550.  LOCAL MEDICATIONS USE:  Marcaine.  SPECIMENS:  Uterus, bilateral tubes, and cervix to Pathology.  FINDINGS:  Uterus with anterior portion enlarged, normal-appearing tubes and ovaries bilaterally.  No other upper abdominal pathology noted.  COMPLICATIONS:  None.  CONDITION:  Stable to PACU.  DESCRIPTION OF PROCEDURE:  The patient was taken to the operating room where general anesthesia was administered and found to be adequate.  She was prepped and draped in the normal sterile fashion in dorsal lithotomy position.  The speculum was placed in the vagina and the anterior lip of the cervix was grasped with a single-tooth tenaculum.  Cervix was serially dilated to 25 Pratt and uterine manipulator with appropriately sized curve entered and placed without difficulty.  Attention was turned to the abdominal portion where a skin incision was made infraumbilically approximately 10 mm.  Veress needle was entered.  Saline drop test was positive.  Gas was started on low flow.  Low pressure was noted and switched to high flow.  Pneumoperitoneum was created and Visiport trocar was used to enter the umbilical  incision with good visualization.  Two additional ports were placed in the left and right lower quadrants under direct visualization and grasper and probe were used to examine tubes and ovaries bilaterally.  Uterine manipulator was utilized to adjust location of the uterus and it was found that the anterior uterus protruded in such a way that minimal uterine manipulation was possible and no visualization of the bladder could be created.  With minimal visualization of the operative field, it was decided that converting to a total abdominal hysterectomy within the patient's best interest.  All instruments and trocars were removed and the scalpel then used to recreate a Pfannenstiel skin incision.  The incision was carried down to the underlying layer of fascia with Bovie cautery and the fascia was incised with the scalpel and extended laterally.  The inferior aspect of the fascia was grasped with Kocher clamps and the rectus muscles were dissected off bluntly and sharply.  In a similar fashion, the superior aspect of the fascia was elevated with Kocher clamps and rectus muscles were dissected off bluntly and sharply here.  Rectus muscles were separated in the midline down towards the pubic symphysis. Preperitoneal fatty tissue was bluntly dissected to expose the peritoneum.  The peritoneum was entered sharply and extended by lateral traction.  The intraabdominal survey revealed the findings as above. Balfour retractor was placed and then the bowel packed using wet sponges.  Slight Trendelenburg was utilized.  Long Kelly clamps were placed bilaterally along the cornu of the uterus.  Upward traction was applied to facilitate exposure and dissection.  Round ligament on the left side was identified, suture ligated, and cut with Bovie cautery. The anterior leaf of the broad ligament was dissected to the midline of the vesicouterine peritoneum.  The bladder was dissected off the lower uterine  segment and cervix.  Window was created in the avascular area of the posterior leaf.  The ovarian ligament and the utero-ovarian ligament and fallopian tubes were clamped, cut, and doubly ligated.  The uterine vessels were skeletonized, doubly clamped and cut.  The pedicles were suture ligated and good hemostasis was noted.  The cardinal ligament and uterosacral ligaments were sequentially clamped and cut and suture ligated.  Similar procedure was performed on the right.  Curved Heaney clamps were placed at the angles of the vagina.  Cervix was removed from the vaginal cuff with scissors.  With curved Heaney's in placed, 0 Vicryl was placed from the midline to the vaginal angle and back again into the midline on both sides.  The Heaney clamps were gently removed and the sutures tied and cut.  Good hemostasis was noted.  Pelvis was irrigated with warm saline and excellent hemostasis was noted.  The patient was taken out of Trendelenburg.  All instruments were removed from the abdomen and the counts were correct x2.  Peritoneum was closed with 4-0 Monocryl and the fascial layer was reapproximated and closed with 0 Vicryl in a running fashion.  Subcutaneous tissue was irrigated, dried, and minimal use of Bovie cautery was needed.  The skin was then reapproximated and closed with staples.  The umbilical fascia was closed with 0 Vicryl and a figure-of-eight.  Abdominal incisions from the laparoscopic portion were closed with 4-0 Monocryl subcuticularly and Dermabond was placed over each.  The patient tolerated the procedure well.  Sponge, lap, and needle counts were correct x2.  The patient was taken to the recovery in stable condition.          ______________________________ Allyn Kenner, DO     [place]/MEDQ  D:  08/01/2013  T:  08/02/2013  Job:  [id number]

## 2013-08-08 NOTE — Discharge Summary (Signed)
Pt admitted 07/16/12 for LAVH, TVT, cysto, converted to TAH, TVT cysto.  Please see Op note for further details of the procedure.  She did well postoperative day 1, foley remained and was removed for voiding trial postop day #2.  She was able to ambulate, tolerated food and voided.  She was slow to have flatus so stayed an additional day and was treated with Dulcolax.  She did well and was discharged home POD#3 in stable condition with instructions to f/u in the office for post-op visit in 2 weeks.

## 2014-02-07 ENCOUNTER — Other Ambulatory Visit (HOSPITAL_COMMUNITY): Payer: Self-pay | Admitting: Podiatry

## 2014-02-07 DIAGNOSIS — S86011A Strain of right Achilles tendon, initial encounter: Secondary | ICD-10-CM

## 2014-02-18 ENCOUNTER — Other Ambulatory Visit (HOSPITAL_COMMUNITY): Payer: 59

## 2016-02-03 ENCOUNTER — Ambulatory Visit: Payer: Self-pay | Admitting: Neurology

## 2016-04-11 ENCOUNTER — Ambulatory Visit: Payer: Self-pay | Admitting: Neurology

## 2016-04-12 ENCOUNTER — Ambulatory Visit: Payer: 59 | Admitting: Cardiology

## 2016-05-02 ENCOUNTER — Encounter: Payer: Self-pay | Admitting: *Deleted

## 2016-05-03 ENCOUNTER — Encounter: Payer: Self-pay | Admitting: Cardiology

## 2016-05-03 NOTE — Progress Notes (Deleted)
Cardiology Office Note  Date: 05/03/2016   ID: Bridget Anderson, DOB [birth year], MRN [id number]  PCP: Glenda Chroman, MD  Consulting Cardiologist: Rozann Lesches, MD   No chief complaint on file.   History of Present Illness: Bridget Anderson is a 46 y.o. female referred for cardiology consultation by Dr. Woody Seller for the assessment of PVCs. Limited information was provided.  Echocardiogram done at [facility] on 02/29/2016 reported mild LVH with LVEF 70-75%, normal right ventricular contraction, mildly thickened mitral leaflets, mild tricuspid regurgitation, no pericardial effusion.  He was seen in the past at [facility] for cardiac evaluation. She had a negative ischemic workup as well as normal LVEF with mild LVH. She has been experiencing palpitations at that time and was found to have PVCs treated with beta blocker.  Past Medical History:  Diagnosis Date  . Chlamydia   . GERD (gastroesophageal reflux disease)   . Hyperlipidemia   . Hypertension   . Obstructive sleep apnea on CPAP   . Seasonal allergies   . Wears glasses     Past Surgical History:  Procedure Laterality Date  . ABDOMINAL HYSTERECTOMY N/A 07/16/2013   Procedure: HYSTERECTOMY ABDOMINAL;  Surgeon: Allyn Kenner, DO;  Location: [facility];  Service: Gynecology;  Laterality: N/A;  . BILATERAL SALPINGECTOMY Bilateral 07/16/2013   Procedure: BILATERAL SALPINGECTOMY;  Surgeon: Allyn Kenner, DO;  Location: [place] [facility];  Service: Gynecology;  Laterality: Bilateral;  . CARPAL TUNNEL RELEASE  01/16/2012   Procedure: CARPAL TUNNEL RELEASE;  Surgeon: Nita Sells, MD;  Location: [place];  Service: Orthopedics;  Laterality: Left;  . CARPAL TUNNEL RELEASE  02/20/2012   Procedure: CARPAL TUNNEL RELEASE;  Surgeon: Nita Sells, MD;  Location: [place];  Service: Orthopedics;  Laterality: Right;  . COLONOSCOPY    . CYSTO WITH HYDRODISTENSION N/A 07/16/2013   Procedure:  CYSTOSCOPY/HYDRODISTENSION;  Surgeon: Reece Packer, MD;  Location: [place] [facility];  Service: Urology;  Laterality: N/A;  . DILATION AND CURETTAGE OF UTERUS    . FOOT SURGERY    . HYSTEROSCOPY W/D&C  10/31/2011   Procedure: DILATATION AND CURETTAGE /HYSTEROSCOPY;  Surgeon: Allyn Kenner, DO;  Location: [facility] [facility];  Service: Gynecology;  Laterality: N/A;  . LAPAROSCOPY N/A 07/16/2013   Procedure: LAPAROSCOPY DIAGNOSTIC;  Surgeon: Allyn Kenner, DO;  Location: [place] [facility];  Service: Gynecology;  Laterality: N/A;  . PUBOVAGINAL SLING N/A 07/16/2013   Procedure: Gaynelle Arabian;  Surgeon: Reece Packer, MD;  Location: [facility];  Service: Urology;  Laterality: N/A;  . TUBAL LIGATION    . [place],  2003    Current Outpatient Prescriptions  Medication Sig Dispense Refill  . albuterol (PROAIR HFA) 108 (90 Base) MCG/ACT inhaler Inhale 2 puffs into the lungs every 6 (six) hours as needed for wheezing or shortness of breath.    Marland Kitchen atorvastatin (LIPITOR) 10 MG tablet Take 10 mg by mouth daily.    . benazepril (LOTENSIN) 20 MG tablet Take 20 mg by mouth 2 (two) times daily.    . carvedilol (COREG) 12.5 MG tablet Take 18.75 mg by mouth 2 (two) times daily with a meal.     . chlorthalidone (HYGROTON) 25 MG tablet Take 25 mg by mouth daily.    Marland Kitchen ibuprofen (ADVIL,MOTRIN) 600 MG tablet Take 1 tablet (600 mg total) by mouth every 6 (six) hours as needed. 90 tablet 0  . SUMAtriptan (IMITREX) 100 MG tablet Take 100 mg by mouth once. May repeat  in 2 hours if headache persists or recurs.     No current facility-administered medications for this visit.    Allergies:  Shellfish allergy and Prednisone   Social History: The patient  reports that she has never smoked. She has never used smokeless tobacco. She reports that she drinks alcohol. She reports that she does not use drugs.   Family History: The patient's family history includes Hyperlipidemia in her father and mother; Hypertension in her brother,  father, and mother.   ROS:  Please see the history of present illness. Otherwise, complete review of systems is positive for {NONE DEFAULTED:18576::"none"}.  All other systems are reviewed and negative.   Physical Exam: VS:  There were no vitals taken for this visit., BMI There is no height or weight on file to calculate BMI.  Wt Readings from Last 3 Encounters:  07/16/13 218 lb (98.9 kg)  07/08/13 218 lb (98.9 kg)  02/20/12 222 lb (100.7 kg)    General: Patient appears comfortable at rest. HEENT: Conjunctiva and lids normal, oropharynx clear with moist mucosa. Neck: Supple, no elevated JVP or carotid bruits, no thyromegaly. Lungs: Clear to auscultation, nonlabored breathing at rest. Cardiac: Regular rate and rhythm, no S3 or significant systolic murmur, no pericardial rub. Abdomen: Soft, nontender, no hepatomegaly, bowel sounds present, no guarding or rebound. Extremities: No pitting edema, distal pulses 2+. Skin: Warm and dry. Musculoskeletal: No kyphosis. Neuropsychiatric: Alert and oriented x3, affect grossly appropriate.  ECG: I personally reviewed the tracing from 07/08/2013 which showed normal sinus rhythm.   Recent Labwork: No results found for requested labs within last 8760 hours.  No results found for: CHOL, TRIG, HDL, CHOLHDL, VLDL, LDLCALC, LDLDIRECT  Other Studies Reviewed Today:  Exercise echocardiogram 05/05/2009: Study Conclusions  - Stress ECG conclusions: The stress ECG was normal. - Staged echo: Normal echo stress Impressions:  - Normal study after maximal exercise.  Assessment and Plan:    Current medicines were reviewed with the patient today.  No orders of the defined types were placed in this encounter.   Disposition:  Signed, Satira Sark, MD, [facility] 05/03/2016 1:03 PM    [facility] [place], [place], [place] [address] Phone: [phone]; Fax: [phone]

## 2016-05-04 ENCOUNTER — Encounter: Payer: Self-pay | Admitting: Cardiology

## 2016-05-04 ENCOUNTER — Ambulatory Visit: Payer: 59 | Admitting: Cardiology

## 2016-05-12 ENCOUNTER — Ambulatory Visit: Payer: 59 | Admitting: Cardiology

## 2016-06-16 ENCOUNTER — Encounter (HOSPITAL_COMMUNITY): Payer: Self-pay

## 2016-06-16 ENCOUNTER — Emergency Department (HOSPITAL_COMMUNITY)
Admission: EM | Admit: 2016-06-16 | Discharge: 2016-06-17 | Disposition: A | Discharge status: Home / Self Care | Payer: 59 | Attending: Emergency Medicine | Admitting: Emergency Medicine

## 2016-06-16 ENCOUNTER — Other Ambulatory Visit: Payer: Self-pay

## 2016-06-16 ENCOUNTER — Emergency Department (HOSPITAL_COMMUNITY): Payer: 59

## 2016-06-16 DIAGNOSIS — G43009 Migraine without aura, not intractable, without status migrainosus: Secondary | ICD-10-CM | POA: Insufficient documentation

## 2016-06-16 DIAGNOSIS — I10 Essential (primary) hypertension: Secondary | ICD-10-CM | POA: Insufficient documentation

## 2016-06-16 DIAGNOSIS — R0789 Other chest pain: Secondary | ICD-10-CM | POA: Diagnosis not present

## 2016-06-16 DIAGNOSIS — Z79899 Other long term (current) drug therapy: Secondary | ICD-10-CM | POA: Diagnosis not present

## 2016-06-16 LAB — BASIC METABOLIC PANEL
ANION GAP: 8 (ref 5–15)
BUN: 13 mg/dL (ref 6–20)
CO2: 22 mmol/L (ref 22–32)
Calcium: 8.6 mg/dL — ABNORMAL LOW (ref 8.9–10.3)
Chloride: 105 mmol/L (ref 101–111)
Creatinine, Ser: 0.73 mg/dL (ref 0.44–1.00)
GFR calc non Af Amer: >60 mL/min (ref >60)
GLUCOSE: 138 mg/dL — AB (ref 65–99)
POTASSIUM: 3.4 mmol/L — AB (ref 3.5–5.1)
Sodium: 135 mmol/L (ref 135–145)

## 2016-06-16 LAB — CBC
HEMATOCRIT: 40.3 % (ref 36.0–46.0)
HEMOGLOBIN: 12.9 g/dL (ref 12.0–15.0)
MCH: 25.7 pg — ABNORMAL LOW (ref 26.0–34.0)
MCHC: 32 g/dL (ref 30.0–36.0)
MCV: 80.4 fL (ref 78.0–100.0)
Platelets: 384 10*3/uL (ref 150–400)
RBC: 5.01 MIL/uL (ref 3.87–5.11)
RDW: 13.9 % (ref 11.5–15.5)
WBC: 12 10*3/uL — ABNORMAL HIGH (ref 4.0–10.5)

## 2016-06-16 LAB — I-STAT TROPONIN, ED: TROPONIN I, POC: 0 ng/mL (ref 0.00–0.08)

## 2016-06-16 MED ORDER — SODIUM CHLORIDE 0.9 % IV BOLUS (SEPSIS)
500.0000 mL | Freq: Once | INTRAVENOUS | Status: AC
  Administered 2016-06-16: 500 mL via INTRAVENOUS

## 2016-06-16 MED ORDER — DIPHENHYDRAMINE HCL 50 MG/ML IJ SOLN
25.0000 mg | Freq: Once | INTRAMUSCULAR | Status: AC
  Administered 2016-06-16: 25 mg via INTRAVENOUS
  Filled 2016-06-16: qty 1

## 2016-06-16 MED ORDER — KETOROLAC TROMETHAMINE 30 MG/ML IJ SOLN
30.0000 mg | Freq: Once | INTRAMUSCULAR | Status: AC
  Administered 2016-06-16: 30 mg via INTRAVENOUS
  Filled 2016-06-16: qty 1

## 2016-06-16 MED ORDER — PROCHLORPERAZINE EDISYLATE 5 MG/ML IJ SOLN
10.0000 mg | Freq: Once | INTRAMUSCULAR | Status: AC
  Administered 2016-06-16: 10 mg via INTRAVENOUS
  Filled 2016-06-16: qty 2

## 2016-06-16 NOTE — ED Provider Notes (Signed)
[facility] DEPT Provider Note   CSN: [id number] Arrival date & time: 06/16/16  2006  By signing my name below, I, Oleh Genin, attest that this documentation has been prepared under the direction and in the presence of Sherwood Gambler, MD. Electronically Signed: Oleh Genin, Scribe. 06/16/16. 11:32 PM.   History   Chief Complaint Chief Complaint  Patient presents with  . Migraine  . Chest Pain    HPI Bridget Anderson is a 46 y.o. female with history of hypertension who presents to the ED for evaluation of a headache. This patient states that in the last 12 hours she has experienced a "throbbing" frontal headache which extends across the top of her scalp. Her symptoms have gradually worsened in the last several hours prompting presentation. Reporting associated photophobia and nausea. No vomiting. She has history of migraines; "about once a month".Feels similar to prior migraines. Also reporting intermittent posterior neck pain; none currently. No fever. No neck stiffness. Occasional L finger paresthesias today; no focal weaknesses or loss of sensation. Also experiencing occasional palpitations in the last few hours with "aching" in her chest. No chest pain currently. She gets these chest pains/palpitations often. She is not taking her anti-hypertensive medications as prescribed.  The history is provided by the patient. No language interpreter was used.    Past Medical History:  Diagnosis Date  . Chlamydia   . GERD (gastroesophageal reflux disease)   . Hyperlipidemia   . Hypertension   . Obstructive sleep apnea on CPAP   . Seasonal allergies   . Wears glasses     Patient Active Problem List   Diagnosis Date Noted  . S/P hysterectomy 07/16/2013    Past Surgical History:  Procedure Laterality Date  . ABDOMINAL HYSTERECTOMY N/A 07/16/2013   Procedure: HYSTERECTOMY ABDOMINAL;  Surgeon: Allyn Kenner, DO;  Location: [facility];  Service: Gynecology;  Laterality: N/A;  .  BILATERAL SALPINGECTOMY Bilateral 07/16/2013   Procedure: BILATERAL SALPINGECTOMY;  Surgeon: Allyn Kenner, DO;  Location: [facility];  Service: Gynecology;  Laterality: Bilateral;  . CARPAL TUNNEL RELEASE  01/16/2012   Procedure: CARPAL TUNNEL RELEASE;  Surgeon: Nita Sells, MD;  Location: [facility];  Service: Orthopedics;  Laterality: Left;  . CARPAL TUNNEL RELEASE  02/20/2012   Procedure: CARPAL TUNNEL RELEASE;  Surgeon: Nita Sells, MD;  Location: [facility];  Service: Orthopedics;  Laterality: Right;  . COLONOSCOPY    . CYSTO WITH HYDRODISTENSION N/A 07/16/2013   Procedure: CYSTOSCOPY/HYDRODISTENSION;  Surgeon: Reece Packer, MD;  Location: [facility];  Service: Urology;  Laterality: N/A;  . DILATION AND CURETTAGE OF UTERUS    . FOOT SURGERY    . HYSTEROSCOPY W/D&C  10/31/2011   Procedure: DILATATION AND CURETTAGE /HYSTEROSCOPY;  Surgeon: Allyn Kenner, DO;  Location: [facility];  Service: Gynecology;  Laterality: N/A;  . LAPAROSCOPY N/A 07/16/2013   Procedure: LAPAROSCOPY DIAGNOSTIC;  Surgeon: Allyn Kenner, DO;  Location: [facility];  Service: Gynecology;  Laterality: N/A;  . PUBOVAGINAL SLING N/A 07/16/2013   Procedure: Gaynelle Arabian;  Surgeon: Reece Packer, MD;  Location: [facility];  Service: Urology;  Laterality: N/A;  . TUBAL LIGATION    . [place],  2003    OB History    No data available       Home Medications    Prior to Admission medications   Medication Sig Start Date End Date Taking? Authorizing Provider  albuterol (PROAIR HFA) 108 (90 Base) MCG/ACT inhaler Inhale 2  puffs into the lungs every 6 (six) hours as needed for wheezing or shortness of breath.    Historical Provider, MD  atorvastatin (LIPITOR) 10 MG tablet Take 10 mg by mouth daily.    Historical Provider, MD  benazepril (LOTENSIN) 20 MG tablet Take 20 mg by mouth 2 (two) times daily.    Historical Provider, MD  carvedilol (COREG) 12.5 MG  tablet Take 18.75 mg by mouth 2 (two) times daily with a meal.     Historical Provider, MD  chlorthalidone (HYGROTON) 25 MG tablet Take 25 mg by mouth daily.    Historical Provider, MD  ibuprofen (ADVIL,MOTRIN) 600 MG tablet Take 1 tablet (600 mg total) by mouth every 6 (six) hours as needed. 07/19/13   Vanessa Kick, MD  SUMAtriptan (IMITREX) 100 MG tablet Take 100 mg by mouth once. May repeat in 2 hours if headache persists or recurs.    Historical Provider, MD    Family History Family History  Problem Relation Age of Onset  . Hypertension Mother   . Hyperlipidemia Mother   . Hypertension Father   . Hyperlipidemia Father   . Hypertension Brother     Social History Social History  Substance Use Topics  . Smoking status: Never Smoker  . Smokeless tobacco: Never Used  . Alcohol use Yes     Comment: Rarely     Allergies   Shellfish allergy and Prednisone   Review of Systems Review of Systems  Constitutional: Negative for fever.  Cardiovascular: Positive for chest pain (per HPI) and palpitations.  Gastrointestinal: Positive for nausea. Negative for vomiting.  Musculoskeletal: Negative for neck stiffness.  Neurological: Positive for headaches. Negative for weakness and numbness.  All other systems reviewed and are negative.    Physical Exam Updated Vital Signs BP (!) 165/100   Pulse 80   Temp 98.1 F (36.7 C) (Oral)   Resp 15   Ht 5\' 6"  (1.676 m)   Wt 225 lb (102.1 kg)   LMP 06/27/2013   SpO2 100%   BMI 36.32 kg/m   Physical Exam  Constitutional: She is oriented to person, place, and time. She appears well-developed and well-nourished.  HENT:  Head: Normocephalic and atraumatic.  Right Ear: External ear normal.  Left Ear: External ear normal.  Nose: Nose normal.  Eyes: EOM are normal. Pupils are equal, round, and reactive to light. Right eye exhibits no discharge. Left eye exhibits no discharge.  Photophobic.   Cardiovascular: Normal rate, regular rhythm and  normal heart sounds.   Pulmonary/Chest: Effort normal and breath sounds normal.  Abdominal: Soft. There is no tenderness.  Neurological: She is alert and oriented to person, place, and time.  CN 3-12 grossly intact. 5/5 strength in all 4 extremities. Grossly normal sensation.  Skin: Skin is warm and dry.  Nursing note and vitals reviewed.    ED Treatments / Results  Labs (all labs ordered are listed, but only abnormal results are displayed) Labs Reviewed  BASIC METABOLIC PANEL - Abnormal; Notable for the following:       Result Value   Potassium 3.4 (*)    Glucose, Bld 138 (*)    Calcium 8.6 (*)    All other components within normal limits  CBC - Abnormal; Notable for the following:    WBC 12.0 (*)    MCH 25.7 (*)    All other components within normal limits  I-STAT TROPOININ, ED  Randolm Idol, ED    EKG  EKG Interpretation  Date/Time:  Thursday  June 16 2016 20:10:49 EDT Ventricular Rate:  79 PR Interval:  156 QRS Duration: 96 QT Interval:  376 QTC Calculation: 431 R Axis:   62 Text Interpretation:  Normal sinus rhythm Nonspecific T wave abnormality Abnormal ECG no significant change since May 2015 Confirmed by Ceairra Mccarver MD, Emera Bussie [phone]) on 06/16/2016 11:01:11 PM       EKG Interpretation  Date/Time:  Friday June 17 2016 01:05:51 EDT Ventricular Rate:  90 PR Interval:  156 QRS Duration: 84 QT Interval:  363 QTC Calculation: 445 R Axis:   67 Text Interpretation:  Sinus rhythm Borderline T abnormalities, diffuse leads no s ignificant change since earlier in the day Confirmed by Costantino Kohlbeck MD, Torie Priebe [phone]) on 06/17/2016 1:14:24 AM       Radiology Dg Chest 2 View  Result Date: 06/16/2016 CLINICAL DATA:  Migraine headaches since 4 p.m. EXAM: CHEST  2 VIEW COMPARISON:  07/29/2011 FINDINGS: The lungs are clear wiithout focal pneumonia, edema, pneumothorax or pleural effusion. The cardiopericardial silhouette is within normal limits for size. The visualized bony  structures of the thorax are intact. IMPRESSION: No active cardiopulmonary disease. Electronically Signed   By: Misty Stanley M.D.   On: 06/16/2016 20:48    Procedures Procedures (including critical care time)  Medications Ordered in ED Medications  prochlorperazine (COMPAZINE) injection 10 mg (10 mg Intravenous Given 06/16/16 2350)  diphenhydrAMINE (BENADRYL) injection 25 mg (25 mg Intravenous Given 06/16/16 2351)  ketorolac (TORADOL) 30 MG/ML injection 30 mg (30 mg Intravenous Given 06/16/16 2351)  sodium chloride 0.9 % bolus 500 mL (0 mLs Intravenous Stopped 06/17/16 0108)     Initial Impression / Assessment and Plan / ED Course  I have reviewed the triage vital signs and the nursing notes.  Pertinent labs & imaging results that were available during my care of the patient were reviewed by me and considered in my medical decision making (see chart for details).     Patient's headache seems consistent with a recurrent migraine. She is feeling much better after Compazine, Benadryl, and Toradol. I highly doubt meningitis, subarachnoid hemorrhage, encephalitis, or other acute CNS emergency. Her neuro exam is unremarkable. She feels better and would like to go home. Her chest pain is atypical and seems more like palpitations than true chest pain. I highly doubt ACS, PE, or dissection. Her blood pressure has also improved which might be partially related to pain. However I have stressed that she needs to take her blood pressure medicines consistently for them to be effective. Advised her to follow-up closely with her PCP. Discussed return precautions.  Final Clinical Impressions(s) / ED Diagnoses   Final diagnoses:  Migraine without aura and without status migrainosus, not intractable  Atypical chest pain    New Prescriptions Discharge Medication List as of 06/17/2016  1:15 AM     I personally performed the services described in this documentation, which was scribed in my presence. The  recorded information has been reviewed and is accurate.    Sherwood Gambler, MD 06/17/16 [phone]

## 2016-06-16 NOTE — ED Triage Notes (Signed)
Pt comes via [facility], has had a migraine headache since 4pm today, took two Excedrin without relief, hx of migraines. Pt also has a hx of HTN BP is 171/100, did not take BP pills today and had not ate today. Pt started c/o of CP in route, central, aching, non radiating accompanied with nausea.

## 2016-06-17 LAB — I-STAT TROPONIN, ED: Troponin i, poc: 0 ng/mL (ref 0.00–0.08)

## 2017-03-15 ENCOUNTER — Encounter: Payer: Self-pay | Admitting: Neurology

## 2017-03-15 ENCOUNTER — Ambulatory Visit: Payer: 59 | Admitting: Neurology

## 2017-03-15 VITALS — BP 170/104 | HR 79 | Ht 66.0 in | Wt 227.0 lb

## 2017-03-15 DIAGNOSIS — G43709 Chronic migraine without aura, not intractable, without status migrainosus: Secondary | ICD-10-CM | POA: Diagnosis not present

## 2017-03-15 DIAGNOSIS — R42 Dizziness and giddiness: Secondary | ICD-10-CM | POA: Diagnosis not present

## 2017-03-15 DIAGNOSIS — G4733 Obstructive sleep apnea (adult) (pediatric): Secondary | ICD-10-CM | POA: Diagnosis not present

## 2017-03-15 DIAGNOSIS — H539 Unspecified visual disturbance: Secondary | ICD-10-CM | POA: Diagnosis not present

## 2017-03-15 DIAGNOSIS — G43711 Chronic migraine without aura, intractable, with status migrainosus: Secondary | ICD-10-CM

## 2017-03-15 DIAGNOSIS — R51 Headache with orthostatic component, not elsewhere classified: Secondary | ICD-10-CM

## 2017-03-15 DIAGNOSIS — R519 Headache, unspecified: Secondary | ICD-10-CM

## 2017-03-15 MED ORDER — ONDANSETRON HCL 4 MG PO TABS: 4.0000 mg | ORAL_TABLET | Freq: Three times a day (TID) | ORAL | 6 refills | Status: DC | PRN

## 2017-03-15 MED ORDER — RIZATRIPTAN BENZOATE 10 MG PO TABS: 10.0000 mg | ORAL_TABLET | ORAL | 6 refills | Status: DC | PRN

## 2017-03-15 MED ORDER — ZONISAMIDE 50 MG PO CAPS: 100.0000 mg | ORAL_CAPSULE | Freq: Every day | ORAL | 8 refills | Status: DC

## 2017-03-15 NOTE — Patient Instructions (Addendum)
Maxlat (Rizatriptan): Please take one tablet at the onset of your headache. If it does not improve the symptoms please take one additional tablet in 2 hours. Do not take more then 2 tablets in 24hrs. Do not take use more then 2 to 3 times in a week. May take with Ondansetron and/or tylenol/ibuprofen/excedrin. Do not take with Imitrex.   Preventative: Start with one Zonegran capsule at bedtime, increase to 2 capsules at bedtime in 1-2 weeks  MRI brain   One lab today  [facility]  Referral to sleep team  F/u 3 months, email ne with updates   Idiopathic Intracranial Hypertension Idiopathic intracranial hypertension (IIH) is a condition that increases pressure around the brain. The fluid that surrounds the brain and spinal cord (cerebrospinal fluid, CSF) increases and causes the pressure. Idiopathic means that the cause of this condition is not known. IIH affects the brain and spinal cord (is a neurological disorder). If this condition is not treated, it can cause vision loss or blindness. What increases the risk? You are more likely to develop this condition if:  You are severely overweight (obese).  You are a woman who has not gone through menopause.  You take certain medicines, such as birth control or steroids.  What are the signs or symptoms? Symptoms of IIH include:  Headaches. This is the most common symptom.  Pain in the shoulders or neck.  Nausea and vomiting.  A "rushing water" or pulsing sound within the ears (pulsatile tinnitus).  Double vision.  Blurred vision.  Brief episodes of complete vision loss.  How is this diagnosed? This condition may be diagnosed based on:  Your symptoms.  Your medical history.  CT scan of the brain.  MRI of the brain.  Magnetic resonance venogram (MRV) to check veins in the brain.  Diagnostic lumbar puncture. This is a procedure to remove and examine a sample of cerebrospinal fluid. This procedure  can determine whether too much fluid may be causing IIH.  A thorough eye exam to check for swelling or nerve damage in the eyes.  How is this treated? Treatment for this condition depends on your symptoms. The goal of treatment is to decrease the pressure around your brain. Common treatments include:  Medicines to decrease the production of spinal fluid and lower the pressure within your skull.  Medicines to prevent or treat headaches.  Surgery to place drains (shunts) in your brain to remove excess fluid.  Lumbar puncture to remove excess cerebrospinal fluid.  Follow these instructions at home:  If you are overweight or obese, work with your health care provider to lose weight.  Take over-the-counter and prescription medicines only as told by your health care provider.  Do not drive or use heavy machinery while taking medicines that can make you sleepy.  Keep all follow-up visits as told by your health care provider. This is important. Contact a health care provider if:  You have changes in your vision, such as: ? Double vision. ? Not being able to see colors (color vision). Get help right away if:  You have any of the following symptoms and they get worse or do not get better. ? Headaches. ? Nausea. ? Vomiting. ? Vision changes or difficulty seeing. Summary  Idiopathic intracranial hypertension (IIH) is a condition that increases pressure around the brain. The cause is not known (is idiopathic).  The most common symptom of IIH is headaches.  Treatment may include medicines or surgery to relieve the pressure  on your brain. This information is not intended to replace advice given to you by your health care provider. Make sure you discuss any questions you have with your health care provider. Document Released: 04/18/2001 Document Revised: 12/30/2015 Document Reviewed: 12/30/2015 Elsevier Interactive Patient Education  2017 [place].    Migraine Headache A  migraine headache is an intense, throbbing pain on one side or both sides of the head. Migraines may also cause other symptoms, such as nausea, vomiting, and sensitivity to light and noise. What are the causes? Doing or taking certain things may also trigger migraines, such as:  Alcohol.  Smoking.  Medicines, such as: ? Medicine used to treat chest pain (nitroglycerine). ? Birth control pills. ? Estrogen pills. ? Certain blood pressure medicines.  Aged cheeses, chocolate, or caffeine.  Foods or drinks that contain nitrates, glutamate, aspartame, or tyramine.  Physical activity.  Other things that may trigger a migraine include:  Menstruation.  Pregnancy.  Hunger.  Stress, lack of sleep, too much sleep, or fatigue.  Weather changes.  What increases the risk? The following factors may make you more likely to experience migraine headaches:  Age. Risk increases with age.  Family history of migraine headaches.  Being Caucasian.  Depression and anxiety.  Obesity.  Being a woman.  Having a hole in the heart (patent foramen ovale) or other heart problems.  What are the signs or symptoms? The main symptom of this condition is pulsating or throbbing pain. Pain may:  Happen in any area of the head, such as on one side or both sides.  Interfere with daily activities.  Get worse with physical activity.  Get worse with exposure to bright lights or loud noises.  Other symptoms may include:  Nausea.  Vomiting.  Dizziness.  General sensitivity to bright lights, loud noises, or smells.  Before you get a migraine, you may get warning signs that a migraine is developing (aura). An aura may include:  Seeing flashing lights or having blind spots.  Seeing bright spots, halos, or zigzag lines.  Having tunnel vision or blurred vision.  Having numbness or a tingling feeling.  Having trouble talking.  Having muscle weakness.  How is this diagnosed? A migraine  headache can be diagnosed based on:  Your symptoms.  A physical exam.  Tests, such as CT scan or MRI of the head. These imaging tests can help rule out other causes of headaches.  Taking fluid from the spine (lumbar puncture) and analyzing it (cerebrospinal fluid analysis, or CSF analysis).  How is this treated? A migraine headache is usually treated with medicines that:  Relieve pain.  Relieve nausea.  Prevent migraines from coming back.  Treatment may also include:  Acupuncture.  Lifestyle changes like avoiding foods that trigger migraines.  Follow these instructions at home: Medicines  Take over-the-counter and prescription medicines only as told by your health care provider.  Do not drive or use heavy machinery while taking prescription pain medicine.  To prevent or treat constipation while you are taking prescription pain medicine, your health care provider may recommend that you: ? Drink enough fluid to keep your urine clear or pale yellow. ? Take over-the-counter or prescription medicines. ? Eat foods that are high in fiber, such as fresh fruits and vegetables, whole grains, and beans. ? Limit foods that are high in fat and processed sugars, such as fried and sweet foods. Lifestyle  Avoid alcohol use.  Do not use any products that contain nicotine or tobacco, such  as cigarettes and e-cigarettes. If you need help quitting, ask your health care provider.  Get at least 8 hours of sleep every night.  Limit your stress. General instructions   Keep a journal to find out what may trigger your migraine headaches. For example, write down: ? What you eat and drink. ? How much sleep you get. ? Any change to your diet or medicines.  If you have a migraine: ? Avoid things that make your symptoms worse, such as bright lights. ? It may help to lie down in a dark, quiet room. ? Do not drive or use heavy machinery. ? Ask your health care provider what activities are  safe for you while you are experiencing symptoms.  Keep all follow-up visits as told by your health care provider. This is important. Contact a health care provider if:  You develop symptoms that are different or more severe than your usual migraine symptoms. Get help right away if:  Your migraine becomes severe.  You have a fever.  You have a stiff neck.  You have vision loss.  Your muscles feel weak or like you cannot control them.  You start to lose your balance often.  You develop trouble walking.  You faint. This information is not intended to replace advice given to you by your health care provider. Make sure you discuss any questions you have with your health care provider. Document Released: 02/07/2005 Document Revised: 08/28/2015 Document Reviewed: 07/27/2015 Elsevier Interactive Patient Education  2017 [employer]  Zonisamide capsules What is this medicine? ZONISAMIDE (zoe NIS a mide) is used to control partial seizures in adults with epilepsy as well as migraines. This medicine may be used for other purposes; ask your health care provider or pharmacist if you have questions. COMMON BRAND NAME(S): Zonegran What should I tell my health care provider before I take this medicine? They need to know if you have any of these conditions: -dehydrated -diarrhea -history of metabolic acidosis (too much acid in your blood) -ketogenic diet -kidney disease -liver disease -lung disease -osteoporosis -suicidal thoughts, plans, or attempt; a previous suicide attempt by you or a family member -an unusual or allergic reaction to zonisamide, sulfa drugs, other medicines, foods, dyes, or preservatives -pregnant or trying to get pregnant -breast-feeding How should I use this medicine? Take this medicine by mouth with a glass of water. Follow the directions on the prescription label. Swallow whole. Do not break open the capsule. This medicine may be taken with or without food.  Take your doses at regular intervals. Do not take your medicine more often than directed. Do not stop taking this medicine unless instructed by your doctor or health care professional. A special MedGuide will be given to you by the pharmacist with each prescription and refill. Be sure to read this information carefully each time. Talk to your pediatrician regarding the use of this medicine in children. While this drug may be prescribed for children as young as 77 years of age for selected conditions, precautions do apply. Overdosage: If you think you have taken too much of this medicine contact a poison control center or emergency room at once. NOTE: This medicine is only for you. Do not share this medicine with others. What if I miss a dose? If you miss a dose, take it as soon as you can. If it is almost time for your next dose, take only that dose. Do not take double or extra doses. What may interact with this medicine? This  medicine may interact with the following medications -alcohol -antihistamines for allergy, cough and cold -antiviral medicines for HIV or AIDS -certain antibiotics like rifabutin, rifampin -certain medicines for anxiety or sleep -certain medicines for depression like amitriptyline, fluoxetine, sertraline -certain medicines for seizures like carbamazepine, phenobarbital, phenytoin, topiramate -digoxin -diuretics like acetazolamide, dichlorphenamide -general anesthetics like halothane, isoflurane, methoxyflurane, propofol -local anesthetics like lidocaine, pramoxine, tetracaine -medicines that relax muscles for surgery -narcotic medicines for pain -phenothiazines like chlorpromazine, mesoridazine, prochlorperazine, thioridazine -quinidine This list may not describe all possible interactions. Give your health care provider a list of all the medicines, herbs, non-prescription drugs, or dietary supplements you use. Also tell them if you smoke, drink alcohol, or use illegal  drugs. Some items may interact with your medicine. What should I watch for while using this medicine? Visit your doctor or health care professional for regular checks on your progress. Wear a medical identification bracelet or chain to say you have epilepsy, and carry a card that lists all your medications. It is important to take this medicine exactly as directed. When first starting treatment, your dose will need to be adjusted slowly. It may take weeks or months before your dose is stable. You should contact your doctor or health care professional if your seizures get worse or if you have any new types of seizures. Do not stop taking except on your doctor's advice. You may develop a severe reaction. Your doctor will tell you how much medicine to take. You may get drowsy, dizzy, or have blurred vision. Do not drive, use machinery, or do anything that needs mental alertness until you know how this medicine affects you. To reduce dizzy or fainting spells, do not sit or stand up quickly, especially if you are an older patient. Alcohol can increase drowsiness and dizziness. Avoid alcoholic drinks. Avoid extreme heat. This medicine can cause you to sweat less than normal. Your body temperature could increase to dangerous levels, which may lead to heat stroke. This medicine may increase the chance of developing metabolic acidosis. If left untreated, this can cause kidney stones, bone disease, or slowed growth in children. Symptoms include breathing fast, fatigue, loss of appetite, irregular heartbeat, or loss of consciousness. Call your doctor immediately if you experience any of these side effects. Also, tell your doctor about any surgery you plan on having while taking this medicine since this may increase your risk for metabolic acidosis. This medicines may increase the risk of kidney stones. Drinking 6 to 8 glasses of water a day may help prevent the formation of kidney stones. The use of this medicine may  increase the chance of suicidal thoughts or actions. Pay special attention to how you are responding while on this medicine. Any worsening of mood, or thoughts of suicide or dying should be reported to your health care professional right away. Women who become pregnant while using this medicine may enroll in the [place] Pregnancy Registry by calling [phone]. This registry collects information about the safety of antiepileptic drug use during pregnancy. What side effects may I notice from receiving this medicine? Side effects that you should report to your doctor or health care professional as soon as possible: -allergic reactions like skin rash, itching or hives, swelling of the face, lips, or tongue -decreased sweating or a rise in body temperature, especially in patients under 58 years old -difficulty breathing or tightening of the throat -feeling faint or lightheaded, falls -fever, sore throat, sores in your mouth, or bruising easily -  hallucination, loss of contact with reality -irregular heartbeat -loss of appetite -redness, blistering, peeling or loosening of the skin, including inside the mouth -severe drowsiness, difficulty concentrating, or coordination problems -speech or language problems -sudden back pain, abdominal pain, pain when urinating, bloody or dark urine -suicidal thoughts or depression -unusual changes in behavior or mood -unusually weak or tired -vomiting Side effects that usually do not require medical attention (report to your doctor or health care professional if they continue or are bothersome): -headache -nausea This list may not describe all possible side effects. Call your doctor for medical advice about side effects. You may report side effects to FDA at 1-800-FDA-1088. Where should I keep my medicine? Keep out of reach of children. Store at room temperature between 15 and 30 degrees C (59 and 86 degrees F). Keep in a dry place  protected from light. Throw away any unused medicine after the expiration date. NOTE: This sheet is a summary. It may not cover all possible information. If you have questions about this medicine, talk to your doctor, pharmacist, or health care provider.  2018 Elsevier/[employer] Standard (2015-03-12 09:50:49)

## 2017-03-15 NOTE — Progress Notes (Signed)
[facility]    Provider:  Dr Jaynee Eagles Referring Provider: Glenda Chroman, MD Primary Care Physician:  Glenda Chroman, MD  CC:  Migraines  HPI:  Bridget Anderson is a 47 y.o. female here as a referral from Dr. Woody Seller for headache. PMHx HTN, Goiter, vertigo, OSA,  elevated blood glucose, migraine, low back ache, palpitations, HTN, obesity. Migraines started in middle school, she has a family history on her mother's side. She wakes up with a headache and sometimes they wake her up. They affect her life, they make her sick, she has been to the emergency room, a few months ago she had to leave work but the shot didn;t help at the pcp and then had to go to the pcp. Unbearable. They have been daily for at least a year. She had side effects to the Topiramate. Had side effects to imitrex (neck pain). Excedrin but no more than 10 daysin a month. No medication overuse. She hates needles and she hates pills. 3-4 days they are severe migraines, 10 mild-moderate migraines the rest are just dull. No aura. Laying in a dark room helps, movement worsens it. Can be unilateral or holocephalic or in the occipital area, pounding and throbbing, she has episodes of blurry vision, she has ringing in the ears more on one side, like something is breathing in her ear. She has Nausea, not a lot of vomiting. Migraibes can last over 24 hours. Worsening in the last year.   Reviewed notes, labs and imaging from outside physicians, which showed:  Madai had a sleep study on 05/09/2011 at [facility] which revealed mild obstructive sleep apnea with an overall AHI of 10.3. The respiratory events were worse in REM sleep with REM AHI of 32.2. Supine AHI was 14.8. SpO2 nadir was 89%. Many RERAs were also noted during the study. CPAP titration study was done on 06/04/2011 at [facility]. CPAP of 9-12 cm H2O was recommended  Medications tried: Topamax, carvedilol, in the ED she has had Compazine, Benadryl  and Toradol for acute migraine management.  Labs April 2018 BMP with elevated glucose 138, BUN 13 and creatinine is 0.73, CBC with hemoglobin 12.9 and hematocrit 40.3,  Labs July 2018 showed TSH 1.26 normal, BUN 13, creatinine 0.86, LDL 164, hemoglobin A1c 7.2  Reviewed referring provider notes, complained of headache, at that time she had a headache for 4 days, constant and increasing in severity, severe, pounding, throbbing and deep pain, associated with nausea but not vomiting, in the past ibuprofen, tramadol hydrocodone without relief  Review of Systems: Patient complains of symptoms per HPI as well as the following symptoms: headache. Pertinent negatives and positives per HPI. All others negative.   Social History   Socioeconomic History  . Marital status: Single    Spouse name: Not on file  . Number of children: 2  . Years of education: Not on file  . Highest education level: Associate degree: academic program  Social Needs  . Financial resource strain: Not on file  . Food insecurity - worry: Not on file  . Food insecurity - inability: Not on file  . Transportation needs - medical: Not on file  . Transportation needs - non-medical: Not on file  Occupational History  . Not on file  Tobacco Use  . Smoking status: Never Smoker  . Smokeless tobacco: Never Used  Substance and Sexual Activity  . Alcohol use: Yes    Comment: Rarely  . Drug use: No  .  Sexual activity: Not on file  Other Topics Concern  . Not on file  Social History Narrative   Lives at home with her daughter   Right handed   Drinks 3 cups of caffeine daily    Family History  Problem Relation Age of Onset  . Hypertension Mother   . Hyperlipidemia Mother   . Hypertension Father   . Hyperlipidemia Father   . Hypertension Brother     Past Medical History:  Diagnosis Date  . Chlamydia   . Diabetes ([place])   . GERD (gastroesophageal reflux disease)   . Hyperlipidemia   . Hypertension   . Migraine     . Obstructive sleep apnea on CPAP    pt has not worn CPAP in years  . Seasonal allergies   . Wears glasses     Past Surgical History:  Procedure Laterality Date  . ABDOMINAL HYSTERECTOMY N/A 07/16/2013   Procedure: HYSTERECTOMY ABDOMINAL;  Surgeon: Allyn Kenner, DO;  Location: [facility];  Service: Gynecology;  Laterality: N/A;  . BILATERAL SALPINGECTOMY Bilateral 07/16/2013   Procedure: BILATERAL SALPINGECTOMY;  Surgeon: Allyn Kenner, DO;  Location: [facility];  Service: Gynecology;  Laterality: Bilateral;  . CARPAL TUNNEL RELEASE  01/16/2012   Procedure: CARPAL TUNNEL RELEASE;  Surgeon: Nita Sells, MD;  Location: [facility];  Service: Orthopedics;  Laterality: Left;  . CARPAL TUNNEL RELEASE  02/20/2012   Procedure: CARPAL TUNNEL RELEASE;  Surgeon: Nita Sells, MD;  Location: [facility];  Service: Orthopedics;  Laterality: Right;  . COLONOSCOPY    . CYSTO WITH HYDRODISTENSION N/A 07/16/2013   Procedure: CYSTOSCOPY/HYDRODISTENSION;  Surgeon: Reece Packer, MD;  Location: [facility];  Service: Urology;  Laterality: N/A;  . DILATION AND CURETTAGE OF UTERUS    . FOOT SURGERY    . HYSTEROSCOPY W/D&C  10/31/2011   Procedure: DILATATION AND CURETTAGE /HYSTEROSCOPY;  Surgeon: Allyn Kenner, DO;  Location: [facility];  Service: Gynecology;  Laterality: N/A;  . LAPAROSCOPY N/A 07/16/2013   Procedure: LAPAROSCOPY DIAGNOSTIC;  Surgeon: Allyn Kenner, DO;  Location: [facility];  Service: Gynecology;  Laterality: N/A;  . PUBOVAGINAL SLING N/A 07/16/2013   Procedure: Gaynelle Arabian;  Surgeon: Reece Packer, MD;  Location: [facility];  Service: Urology;  Laterality: N/A;  . TUBAL LIGATION    . [place],  2003    Current Outpatient Medications  Medication Sig Dispense Refill  . albuterol (PROAIR HFA) 108 (90 Base) MCG/ACT inhaler Inhale 2 puffs into the lungs every 6 (six) hours as needed for wheezing or shortness of breath.    Marland Kitchen  atorvastatin (LIPITOR) 10 MG tablet Take 10 mg by mouth daily.    . benazepril (LOTENSIN) 20 MG tablet Take 20 mg by mouth 2 (two) times daily.    . carvedilol (COREG) 12.5 MG tablet Take 18.75 mg by mouth 2 (two) times daily with a meal.     . chlorthalidone (HYGROTON) 25 MG tablet Take 25 mg by mouth daily.    Marland Kitchen ibuprofen (ADVIL,MOTRIN) 600 MG tablet Take 1 tablet (600 mg total) by mouth every 6 (six) hours as needed. 90 tablet 0  . ondansetron (ZOFRAN) 4 MG tablet Take 1 tablet (4 mg total) by mouth every 8 (eight) hours as needed for nausea or vomiting. 20 tablet 6  . rizatriptan (MAXALT) 10 MG tablet Take 1 tablet (10 mg total) by mouth as needed for migraine. May repeat in 2 hours if needed 10 tablet 6  .  zonisamide (ZONEGRAN) 50 MG capsule Take 2 capsules (100 mg total) by mouth daily. 60 capsule 8   No current facility-administered medications for this visit.     Allergies as of 03/15/2017 - Review Complete 03/15/2017  Allergen Reaction Noted  . Shellfish allergy Anaphylaxis 10/28/2011  . Prednisone  04/11/2016    Vitals: BP (!) 170/104 (BP Location: Right Arm, Patient Position: Sitting) Comment: recheck @ end of visit, manual, MD updated.  Pulse 79   Ht 5\' 6"  (1.676 m)   Wt 227 lb (103 kg)   LMP 06/27/2013   BMI 36.64 kg/m  Last Weight:  Wt Readings from Last 1 Encounters:  03/15/17 227 lb (103 kg)   Last Height:   Ht Readings from Last 1 Encounters:  03/15/17 5\' 6"  (1.676 m)   Physical exam: Exam: Gen: NAD, conversant, well nourised, obese, well groomed                     CV: RRR, no MRG. No Carotid Bruits. No peripheral edema, warm, nontender Eyes: Conjunctivae clear without exudates or hemorrhage  Neuro: Detailed Neurologic Exam  Speech:    Speech is normal; fluent and spontaneous with normal comprehension.  Cognition:    The patient is oriented to person, place, and time;     recent and remote memory intact;     language fluent;     normal attention,  concentration,     fund of knowledge Cranial Nerves:    The pupils are equal, round, and reactive to light. The fundi are normal and spontaneous venous pulsations are present. Visual fields are full to finger confrontation. Extraocular movements are intact. Trigeminal sensation is intact and the muscles of mastication are normal. The face is symmetric. The palate elevates in the midline. Hearing intact. Voice is normal. Shoulder shrug is normal. The tongue has normal motion without fasciculations.   Coordination:    Normal finger to nose and heel to shin. Normal rapid alternating movements.   Gait:    Heel-toe and tandem gait are normal.   Motor Observation:    No asymmetry, no atrophy, and no involuntary movements noted. Tone:    Normal muscle tone.    Posture:    Posture is normal. normal erect    Strength:    Strength is V/V in the upper and lower limbs.      Sensation: intact to LT     Reflex Exam:  DTR's:    Deep tendon reflexes in the upper and lower extremities are normal bilaterally.   Toes:    The toes are downgoing bilaterally.   Clonus:    Clonus is absent.       Assessment/Plan:  47 year old with chronic migraines, intractable headache  Untreated sleep apnea: Caprisha had a sleep study on 05/09/2011 at [facility] which revealed mild obstructive sleep apnea with an overall AHI of 10.3. The respiratory events were worse in REM sleep with REM AHI of 32.2. Supine AHI was 14.8. SpO2 nadir was 89%. Many RERAs were also noted during the study. CPAP titration study was done on 06/04/2011 at [facility]. CPAP of 9-12 cm H2O was recommended. She has not been on a cpap, referral to sleep study.  MRI brain w/wo contrast: due to positional quality, severe, intractable with vision changes - to eval for space-occupying mass or lesions, chiari malformation or pseudotumor cerebti  Maxlat (Rizatriptan): Please take one tablet at the onset of your headache.  If it does not improve the symptoms please take one additional tablet in 2 hours. Do not take more then 2 tablets in 24hrs. Do not take use more then 2 to 3 times in a week. May take with Ondansetron and/or tylenol/ibuprofen/excedrin. Do not take with Imitrex.   Preventative: Start with one Zonegran capsule at bedtime, increase to 2 capsules at bedtime in 1-2 weeks  [facility] for morbid obesity  Orders Placed This Encounter  Procedures  . MR BRAIN W WO CONTRAST  . Basic Metabolic Panel  . Ambulatory referral to Sleep Studies  . Ambulatory referral to Family Practice   Discussed: To prevent or relieve headaches, try the following: Cool Compress. Lie down and place a cool compress on your head.  Avoid headache triggers. If certain foods or odors seem to have triggered your migraines in the past, avoid them. A headache diary might help you identify triggers.  Include physical activity in your daily routine. Try a daily walk or other moderate aerobic exercise.  Manage stress. Find healthy ways to cope with the stressors, such as delegating tasks on your to-do list.  Practice relaxation techniques. Try deep breathing, yoga, massage and visualization.  Eat regularly. Eating regularly scheduled meals and maintaining a healthy diet might help prevent headaches. Also, drink plenty of fluids.  Follow a regular sleep schedule. Sleep deprivation might contribute to headaches Consider biofeedback. With this mind-body technique, you learn to control certain bodily functions - such as muscle tension, heart rate and blood pressure - to prevent headaches or reduce headache pain.    Proceed to emergency room if you experience new or worsening symptoms or symptoms do not resolve, if you have new neurologic symptoms or if headache is severe, or for any concerning symptom.   Provided education and documentation from American headache Society toolbox including articles on: chronic  migraine medication overuse headache, chronic migraines, prevention of migraines, behavioral and other nonpharmacologic treatments for headache.   Sarina Ill, MD  [facility] [address] [place], [place] [address]  Phone [phone] Fax [phone]

## 2017-03-16 ENCOUNTER — Telehealth: Payer: Self-pay | Admitting: *Deleted

## 2017-03-16 LAB — BASIC METABOLIC PANEL
BUN/Creatinine Ratio: 18 (ref 9–23)
BUN: 14 mg/dL (ref 6–24)
CHLORIDE: 101 mmol/L (ref 96–106)
CO2: 20 mmol/L (ref 20–29)
Calcium: 9.1 mg/dL (ref 8.7–10.2)
Creatinine, Ser: 0.79 mg/dL (ref 0.57–1.00)
GFR, EST AFRICAN AMERICAN: 103 mL/min/{1.73_m2} (ref >59)
GFR, EST NON AFRICAN AMERICAN: 89 mL/min/{1.73_m2} (ref >59)
Glucose: 157 mg/dL — ABNORMAL HIGH (ref 65–99)
POTASSIUM: 4.3 mmol/L (ref 3.5–5.2)
SODIUM: 138 mmol/L (ref 134–144)

## 2017-03-16 NOTE — Telephone Encounter (Signed)
Spoke with patient and informed her that her labs are unremarkable. Advised she had an elevated glucose, but she may have just eaten. She verbalized understanding, appreciation.

## 2017-03-20 ENCOUNTER — Encounter: Payer: Self-pay | Admitting: Neurology

## 2017-03-20 DIAGNOSIS — G43711 Chronic migraine without aura, intractable, with status migrainosus: Secondary | ICD-10-CM | POA: Insufficient documentation

## 2017-03-21 ENCOUNTER — Telehealth: Payer: Self-pay | Admitting: Neurology

## 2017-03-21 NOTE — Telephone Encounter (Signed)
Pt would like a call from [facility] re: her MRI.  Pt would like to know the earliest appointment she could have re: the MRI please call

## 2017-03-21 NOTE — Telephone Encounter (Signed)
Left a voicemail for patient to call me back about scheduling MRI.

## 2017-03-21 NOTE — Telephone Encounter (Signed)
Patient is schedule to have her MRI at [facility] on 04/17/17 arrival time is 1:30 pm. Patient is aware of time and day.

## 2017-03-28 ENCOUNTER — Telehealth: Payer: Self-pay | Admitting: Neurology

## 2017-03-28 NOTE — Telephone Encounter (Signed)
Pt called stating that atorvastatin (LIPITOR) 10 MG tablet or  zonisamide (ZONEGRAN) 50 MG capsule haven't helped her headaches at all. Pt said if anything they have slowly been getting worse, she is wanting to know if maybe she can get a shot to help

## 2017-03-28 NOTE — Telephone Encounter (Signed)
Spoke with patient. She stated that she was already at her other doctor getting a shot. She stated she has taken her Zonegran and Maxalt and is still having headaches. She is aware that Dr. Jaynee Eagles is currently out of the office. She will call us with any further issues.

## 2017-04-12 ENCOUNTER — Institutional Professional Consult (permissible substitution): Payer: 59 | Admitting: Neurology

## 2017-05-04 ENCOUNTER — Encounter (INDEPENDENT_AMBULATORY_CARE_PROVIDER_SITE_OTHER): Payer: 59

## 2017-05-11 ENCOUNTER — Ambulatory Visit: Payer: 59 | Admitting: Neurology

## 2017-05-11 ENCOUNTER — Encounter: Payer: Self-pay | Admitting: Neurology

## 2017-05-11 VITALS — BP 182/107 | HR 72 | Ht 66.0 in | Wt 225.0 lb

## 2017-05-11 DIAGNOSIS — G4719 Other hypersomnia: Secondary | ICD-10-CM

## 2017-05-11 DIAGNOSIS — G4730 Sleep apnea, unspecified: Secondary | ICD-10-CM

## 2017-05-11 DIAGNOSIS — G4710 Hypersomnia, unspecified: Secondary | ICD-10-CM

## 2017-05-11 DIAGNOSIS — E669 Obesity, unspecified: Secondary | ICD-10-CM | POA: Diagnosis not present

## 2017-05-11 DIAGNOSIS — R0683 Snoring: Secondary | ICD-10-CM

## 2017-05-11 DIAGNOSIS — G4726 Circadian rhythm sleep disorder, shift work type: Secondary | ICD-10-CM | POA: Diagnosis not present

## 2017-05-11 DIAGNOSIS — M2619 Other specified anomalies of jaw-cranial base relationship: Secondary | ICD-10-CM | POA: Diagnosis not present

## 2017-05-11 NOTE — Progress Notes (Signed)
SLEEP MEDICINE CLINIC   Provider:  Larey Seat, [place] D  Primary Care Physician:  Glenda Chroman, MD   Referring Provider:  Sarina Ill, MD    Chief Complaint  Patient presents with  . New Patient (Initial Visit)    pt alone, rm 10, pt states she snores and gasp for air in sleep. sleep study done over 5 years ago used the cpap for short time but was unable to continue with it.     HPI:  Bridget Anderson is a 47 y.o. female , seen here as in a referral  from Dr. Jaynee Eagles, for a sleep apnea evaluation.   Mrs. Shepler has continuously gained weight over the last 15 years after the birth of her youngest daughter.  The patient had previously undergone a sleep study about 6 or 7 years ago, which resulted in the diagnosis of obstructive sleep apnea and she was given CPAP.  She states that soon after she was placed on CPAP she developed increasing difficulties to tolerate it.  After some months she just gave up.  She has not been using CPAP since then she has not been re-evaluated for sleepiness.  She failed a FFM - was given FFM after the sleep tech advised her that she is a mouth breather.  She endorsed a very high degree of daytime sleepiness referring to her Epworth sleepiness score of 19.5 out of 24 possible points.  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 She is also a swing shift worker, which affects sleep pattern, circadian rhythm and metabolism.    Chief complaint according to patient : " I am a shift worker and always sleepy" " I choke a lot in my sleep " ,"my husband stated I snore loudly "  Sleep habits are as follows: The patient works 12-hour shifts 2 weeks of day shift alternate with 2 weeks on night shift.  She works 3 days one week , alternating with 4 days the next week.   Night shift spends the hours between 6 PM and 7 AM, she also works every other weekend.  The patient lives in [place] but works here in [place].  Sometimes she is so tired when she drives home that she has to turn off the road and take a power nap. Her commute is about 40 minutes in duration, and when she is home she cannot immediately go to bed.  She will be in bed after a night shift by 10 AM, sleeping for about  5 hours.  Daytime shift days- working 6 AM to 7 PM. Home by 8 PM and in bed by 10.30 Pm- sleeping until 4.15 AM, less than 6 hours. 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 .   Kym Groom is cool , quiet and dark. She likes to sleep on her right side, on 2-3 pillows, she snores according to her husband. She chokes often in her sleep. She dreams, crazy dreams- and had more dreams while on CPAP. Nocturia 2-3 times, witnessed apnea. Alarm rings at 4.15 AM - often interrupting her dreams.  Scheduled naps- none. Sleep attacks : yes.   Sleep medical history and family sleep history: family members snore, 2 brothers on CPAP.    Social history:  Single , 2 daughters 58 and 54,  lives with the younger one.  None smoker, life long- ETOH : socially : wine twice a month. Caffeine ; night shifts work hours 2 sodas at night, no energy drinks. Coffee and iced tea 1 or 2 cups a week.    Review of Systems: Out of a complete 14 system review, the patient complains of only the following symptoms, and all other reviewed systems are negative. CPAP did not help her sleepiness after only 4-6 weeks of user time.   Epworth score 19.5, How likely are you to doze in the following situations: 0 = not likely, 1 = slight chance, 2 = moderate chance, 3 = high chance  Sitting and Reading? 3 Watching Television?  3 Sitting inactive in a public place (theater or meeting)? 3 Lying down in the afternoon when circumstances permit? 2 Sitting and talking to someone? 3 Sitting quietly after lunch without alcohol?2 In a  car, while stopped for a few minutes in traffic?2 As a passenger in a car for an hour without a break?2  Total = 19.5-20    Snoring, choking, dreaming, always sleepy.   Social History   Socioeconomic History  . Marital status: Single    Spouse name: Not on file  . Number of children: 2  . Years of education: Not on file  . Highest education level: Associate degree: academic program  Occupational History  . Not on file  Social Needs  . Financial resource strain: Not on file  . Food insecurity:    Worry: Not on file    Inability: Not on file  . Transportation needs:    Medical: Not on file    Non-medical: Not on file  Tobacco Use  . Smoking status: Never Smoker  . Smokeless tobacco: Never Used  Substance and Sexual Activity  . Alcohol use: Yes    Comment: Rarely  . Drug use: No  . Sexual activity: Not on file  Lifestyle  . Physical activity:    Days per week: Not on file    Minutes per session: Not on file  . Stress: Not on file  Relationships  . Social connections:    Talks on phone: Not on file    Gets together: Not on file    Attends religious service: Not on file    Active member of club or organization: Not on file    Attends meetings of clubs or organizations: Not on file    Relationship status: Not on file  . Intimate partner violence:    Fear of current or ex partner: Not on file    Emotionally abused: Not on file    Physically abused: Not on file    Forced sexual activity: Not on file  Other Topics Concern  . Not on file  Social History Narrative   Lives at home with her daughter   Right handed   Drinks 3 cups of caffeine daily    Family History  Problem Relation Age of Onset  . Hypertension Mother   . Hyperlipidemia Mother   . Hypertension Father   . Hyperlipidemia Father   . Hypertension Brother     Past Medical History:  Diagnosis Date  . Chlamydia   . Diabetes ([place])   . GERD (gastroesophageal reflux disease)   . Hyperlipidemia   .  Hypertension   . Migraine   . Obstructive sleep apnea on CPAP    pt has not worn CPAP in years  . Seasonal allergies   . Wears glasses  Past Surgical History:  Procedure Laterality Date  . ABDOMINAL HYSTERECTOMY N/A 07/16/2013   Procedure: HYSTERECTOMY ABDOMINAL;  Surgeon: Allyn Kenner, DO;  Location: [facility];  Service: Gynecology;  Laterality: N/A;  . BILATERAL SALPINGECTOMY Bilateral 07/16/2013   Procedure: BILATERAL SALPINGECTOMY;  Surgeon: Allyn Kenner, DO;  Location: [facility];  Service: Gynecology;  Laterality: Bilateral;  . CARPAL TUNNEL RELEASE  01/16/2012   Procedure: CARPAL TUNNEL RELEASE;  Surgeon: Nita Sells, MD;  Location: [facility];  Service: Orthopedics;  Laterality: Left;  . CARPAL TUNNEL RELEASE  02/20/2012   Procedure: CARPAL TUNNEL RELEASE;  Surgeon: Nita Sells, MD;  Location: [facility];  Service: Orthopedics;  Laterality: Right;  . COLONOSCOPY    . CYSTO WITH HYDRODISTENSION N/A 07/16/2013   Procedure: CYSTOSCOPY/HYDRODISTENSION;  Surgeon: Reece Packer, MD;  Location: [facility];  Service: Urology;  Laterality: N/A;  . DILATION AND CURETTAGE OF UTERUS    . FOOT SURGERY    . HYSTEROSCOPY W/D&C  10/31/2011   Procedure: DILATATION AND CURETTAGE /HYSTEROSCOPY;  Surgeon: Allyn Kenner, DO;  Location: [facility];  Service: Gynecology;  Laterality: N/A;  . LAPAROSCOPY N/A 07/16/2013   Procedure: LAPAROSCOPY DIAGNOSTIC;  Surgeon: Allyn Kenner, DO;  Location: [facility];  Service: Gynecology;  Laterality: N/A;  . PUBOVAGINAL SLING N/A 07/16/2013   Procedure: Gaynelle Arabian;  Surgeon: Reece Packer, MD;  Location: [facility];  Service: Urology;  Laterality: N/A;  . TUBAL LIGATION    . [place],  2003    Current Outpatient Medications  Medication Sig Dispense Refill  . albuterol (PROAIR HFA) 108 (90 Base) MCG/ACT inhaler Inhale 2 puffs into the lungs every 6 (six) hours as needed for wheezing or  shortness of breath.    Marland Kitchen atorvastatin (LIPITOR) 10 MG tablet Take 10 mg by mouth daily.    . benazepril (LOTENSIN) 20 MG tablet Take 20 mg by mouth 2 (two) times daily.    . carvedilol (COREG) 12.5 MG tablet Take 18.75 mg by mouth 2 (two) times daily with a meal.     . chlorthalidone (HYGROTON) 25 MG tablet Take 25 mg by mouth daily.    Marland Kitchen ibuprofen (ADVIL,MOTRIN) 600 MG tablet Take 1 tablet (600 mg total) by mouth every 6 (six) hours as needed. 90 tablet 0  . ondansetron (ZOFRAN) 4 MG tablet Take 1 tablet (4 mg total) by mouth every 8 (eight) hours as needed for nausea or vomiting. 20 tablet 6  . rizatriptan (MAXALT) 10 MG tablet Take 1 tablet (10 mg total) by mouth as needed for migraine. May repeat in 2 hours if needed 10 tablet 6  . zonisamide (ZONEGRAN) 50 MG capsule Take 2 capsules (100 mg total) by mouth daily. 60 capsule 8   No current facility-administered medications for this visit.     Allergies as of 05/11/2017 - Review Complete 05/11/2017  Allergen Reaction Noted  . Shellfish allergy Anaphylaxis 10/28/2011  . Prednisone  04/11/2016    Vitals: BP (!) 182/107   Pulse 72   Ht 5\' 6"  (1.676 m)   Wt 225 lb (102.1 kg)   LMP 06/27/2013   BMI 36.32 kg/m  Last Weight:  Wt Readings from Last 1 Encounters:  05/11/17 225 lb (102.1 kg)   CZY:SAYT mass index is 36.32 kg/m.     Last Height:   Ht Readings from Last 1 Encounters:  05/11/17 5\' 6"  (1.676 m)    Physical exam:  General: The patient is awake, alert  and appears not in acute distress. The patient is well groomed. Head: Normocephalic, atraumatic. Neck is supple. Mallampati 5,  neck circumference:16. 5. Nasal airflow patent ,Retrognathia is seen.  Cardiovascular:  Regular rate and rhythm , without  murmurs or carotid bruit, and without distended neck veins. Respiratory: Lungs are clear to auscultation. Asthma allergic -  Skin:  Without evidence of edema, or rash Trunk: BMI is 36. 3  Neurologic exam : The patient is  awake and alert, oriented to place and time.   Memory subjective described as intact.  Attention span & concentration ability appears normal.  Speech is fluent, without dysarthria, dysphonia or aphasia.  Mood and affect are appropriate.  Cranial nerves: Pupils are equal and briskly reactive to light. Funduscopic exam without evidence of pallor or edema. Extraocular movements  in vertical and horizontal planes intact and without nystagmus. Visual fields by finger perimetry are intact. Hearing to finger rub intact. Facial sensation intact to fine touch.Facial motor strength is symmetric and tongue and uvula move midline. Shoulder shrug was symmetrical.  Motor exam:   Normal tone, muscle bulk and symmetric strength in all extremities. Sensory:  Fine touch, pinprick and vibration were tested in all extremities. Proprioception tested in the upper extremities was normal. Coordination: Rapid alternating movements in the fingers/hands was normal. Finger-to-nose maneuver  normal without evidence of ataxia, dysmetria or tremor. Gait and station: Patient walks without assistive device . Turns with 3-4  Steps.  Deep tendon reflexes: in the  upper and lower extremities are symmetric and intact. Babinski maneuver response is downgoing.   Assessment:  After physical and neurologic examination, review of laboratory studies, results of polysomnography and / or neurophysiology testing and pre-existing records as far as provided in visit.  Mrs. Wendell also reports that her sleep study took place in a hotel that was used for the purpose of sleep studies, overseen by [employer].  This may have well been 7 or 8 years ago.  She had her sleep test in 2 parts one for diagnosis and then she return for CPAP titration.  Again it was the sleep technologist that made the assignment for full facemask.  She could not tolerate.  I have no doubt that the patient still has obstructive sleep apnea if she tested positive several  years ago.  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  Also with age muscle tone is often decreased and in return apnea index increases.  I would like to see the patient for an attended sleep study as a shift worker.  My goal would be to see her for 2 hours and if she produces a significant amount of apnea before titrate her the same night on CPAP.   Circadian rhythm disorder.    I would split at 20 apneas per hour.  If she has hypoxemia he can also try to treat that with CPAP alone or add oxygen.  Only after apnea is treated again will it be sensible to test her for narcolepsy.    At this time it is possible that her significant daytime sleepiness is partially due to sleep deprivation as a shift worker and partially due to sleep quality reduction because of untreated obstructive sleep apnea.    1)  SPLIT  at AHI 20, invite for a daytime study - 8 AM to 3 Pm.   2) if apnea is found, treat  for 30-60 days before we meet for Epworth evaluation, if still over 14, will send for PSG and MSLT.   3) Melatonin for shift worker.    The patient was advised of the nature of the diagnosed disorder , the treatment options and the  risks for general health and wellness arising from not treating the condition.   I spent more than 45  minutes of face to face time with the patient.  Greater than 50% of time was spent in counseling and coordination of care. We have discussed the diagnosis and differential and I answered the patient's questions.   RV after SPLIT . Epworth repeat, circadian rhythm disorder - day time sleep study/    Larey Seat, MD 11/05/7827, 5:62 PM  Certified in Neurology by ABPN Certified in [place] by [facility] [address] [place], [place] [address]

## 2017-05-15 ENCOUNTER — Other Ambulatory Visit: Payer: Self-pay | Admitting: Neurology

## 2017-05-15 ENCOUNTER — Telehealth: Payer: Self-pay

## 2017-05-15 DIAGNOSIS — E6609 Other obesity due to excess calories: Secondary | ICD-10-CM

## 2017-05-15 DIAGNOSIS — G4726 Circadian rhythm sleep disorder, shift work type: Secondary | ICD-10-CM

## 2017-05-15 DIAGNOSIS — G4719 Other hypersomnia: Secondary | ICD-10-CM

## 2017-05-15 DIAGNOSIS — G4733 Obstructive sleep apnea (adult) (pediatric): Secondary | ICD-10-CM

## 2017-05-15 DIAGNOSIS — R0683 Snoring: Secondary | ICD-10-CM

## 2017-05-15 DIAGNOSIS — G43711 Chronic migraine without aura, intractable, with status migrainosus: Secondary | ICD-10-CM

## 2017-05-15 DIAGNOSIS — Z9989 Dependence on other enabling machines and devices: Secondary | ICD-10-CM

## 2017-05-15 NOTE — Telephone Encounter (Signed)
"   I have no doubt that the patient still has obstructive sleep apnea if she tested positive several years ago.  In the meantime she has gained weight, her upper airway anatomy is a risk factor because of retrognathia and a high-grade Mallampati.  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 "  Robin ,  We can do the HST first and if that seems not to target the problem, will order SPLIT with Co[employer].  C.Joann Kulpa

## 2017-05-15 NOTE — Telephone Encounter (Signed)
[employer] denied in lab sleep study, need HST order 

## 2017-05-25 ENCOUNTER — Encounter (INDEPENDENT_AMBULATORY_CARE_PROVIDER_SITE_OTHER): Payer: Self-pay | Admitting: Family Medicine

## 2017-05-25 ENCOUNTER — Ambulatory Visit (INDEPENDENT_AMBULATORY_CARE_PROVIDER_SITE_OTHER): Payer: 59 | Admitting: Family Medicine

## 2017-05-25 VITALS — BP 185/116 | HR 86 | Temp 97.9°F | Ht 66.0 in | Wt 222.0 lb

## 2017-05-25 DIAGNOSIS — Z6835 Body mass index (BMI) 35.0-35.9, adult: Secondary | ICD-10-CM

## 2017-05-25 DIAGNOSIS — Z1331 Encounter for screening for depression: Secondary | ICD-10-CM

## 2017-05-25 DIAGNOSIS — R5383 Other fatigue: Secondary | ICD-10-CM

## 2017-05-25 DIAGNOSIS — I10 Essential (primary) hypertension: Secondary | ICD-10-CM | POA: Diagnosis not present

## 2017-05-25 DIAGNOSIS — Z9189 Other specified personal risk factors, not elsewhere classified: Secondary | ICD-10-CM | POA: Diagnosis not present

## 2017-05-25 DIAGNOSIS — E119 Type 2 diabetes mellitus without complications: Secondary | ICD-10-CM | POA: Diagnosis not present

## 2017-05-25 DIAGNOSIS — R0602 Shortness of breath: Secondary | ICD-10-CM | POA: Diagnosis not present

## 2017-05-25 DIAGNOSIS — Z0289 Encounter for other administrative examinations: Secondary | ICD-10-CM

## 2017-05-25 MED ORDER — ONETOUCH VERIO FLEX SYSTEM W/DEVICE KIT: 1.0000 | PACK | Freq: Two times a day (BID) | 0 refills | Status: DC

## 2017-05-25 MED ORDER — ONETOUCH DELICA LANCETS 33G MISC: 1.0000 | Freq: Two times a day (BID) | 0 refills | Status: DC

## 2017-05-25 MED ORDER — GLUCOSE BLOOD VI STRP: ORAL_STRIP | 0 refills | Status: DC

## 2017-05-25 NOTE — Progress Notes (Signed)
Office: [phone]  /  Fax: [phone]   Dear Dr. Brett Fairy,   Thank you for referring Bridget Anderson to our clinic. The following note includes my evaluation and treatment recommendations.  HPI:   Chief Complaint: OBESITY    Bridget Anderson has been referred by Bridget Seat, MD for consultation regarding her obesity and obesity related comorbidities.    Bridget Anderson (MR# [id number]) is a 47 y.o. female who presents on 05/25/2017 for obesity evaluation and treatment. Current BMI is Body mass index is 35.83 kg/m.Bridget Anderson has been struggling with her weight for many years and has been unsuccessful in either losing weight, maintaining weight loss, or reaching her healthy weight goal.     Bridget Anderson attended our information session and states she is currently in the action stage of change and ready to dedicate time achieving and maintaining a healthier weight. Bridget Anderson is interested in becoming our patient and working on intensive lifestyle modifications including (but not limited to) diet, exercise and weight loss.    Bridget Anderson states her family eats meals together she thinks her family will eat healthier with  her her desired weight loss is 42 lbs she started gaining weight after her last daugther her heaviest weight ever was 222 lbs she snacks frequently in the evenings she skips meals frequently she is frequently drinking liquids with calories she frequently makes poor food choices she has problems with excessive hunger  she frequently eats larger portions than normal  she struggles with emotional eating    Fatigue Bridget Anderson feels her energy is lower than it should be. This has worsened with weight gain and has not worsened recently. Bridget Anderson admits to daytime somnolence and  admits to waking up still tired. Patient is at risk for obstructive sleep apnea. Patent has a history of symptoms of daytime fatigue and morning headache. Patient generally gets 4 hours of sleep per night, and states they generally  have nightime awakenings. Snoring is present. Apneic episodes are present. Epworth Sleepiness Score is 17.  Dyspnea on exertion Bridget Anderson notes increasing shortness of breath with exercising and seems to be worsening over time with weight gain. She notes getting out of breath sooner with activity than she used to. This has not gotten worse recently. Bridget Anderson denies orthopnea.  Hypertension Bridget Anderson is a 47 y.o. female with hypertension. Bridget Anderson frequently forgets her blood pressure medications, she works a fluctuating work shift. She is also going to have a sleep study done in the next 1-2 weeks. Echocardiogram in 2018 shows mild left ventricular hypertrophy. She is working weight loss to help control her blood pressure with the goal of decreasing her risk of heart attack and stroke. Bridget Anderson's blood pressure is not currently controlled.  Diabetes II Bridget Anderson has a diagnosis of diabetes type II. Bridget Anderson has been on metformin in the past but had GI upset, so stopped. She hasn't followed up with her primary care physician for diabetes mellitus in years. She has been working on intensive lifestyle modifications including diet, exercise, and weight loss to help control her blood glucose levels.  At risk for cardiovascular disease Bridget Anderson is at a higher than average risk for cardiovascular disease due to obesity, hypertension, and diabetes II. She currently denies any chest pain.  Depression Screen Bridget Anderson's Food and Mood (modified PHQ-9) score was  Depression screen PHQ 2/9 05/25/2017  Decreased Interest 3  Down, Depressed, Hopeless 1  PHQ - 2 Score 4  Altered sleeping 2  Tired, decreased energy 3  Change in appetite 1  Feeling bad or failure about yourself  0  Trouble concentrating 3  Moving slowly or fidgety/restless 0  Suicidal thoughts 0  PHQ-9 Score 13  Difficult doing work/chores Not difficult at all    ALLERGIES: Allergies  Allergen Reactions  . Shellfish Allergy Anaphylaxis    Allergic to some  shellfish but all regular fish.  . Prednisone     Headache    MEDICATIONS: Current Outpatient Medications on File Prior to Visit  Medication Sig Dispense Refill  . atorvastatin (LIPITOR) 10 MG tablet Take 10 mg by mouth daily.    . carvedilol (COREG) 12.5 MG tablet Take 18.75 mg by mouth 2 (two) times daily with a meal.     . chlorthalidone (HYGROTON) 25 MG tablet Take 25 mg by mouth daily.    . rizatriptan (MAXALT) 10 MG tablet Take 1 tablet (10 mg total) by mouth as needed for migraine. May repeat in 2 hours if needed 10 tablet 6  . zonisamide (ZONEGRAN) 50 MG capsule Take 2 capsules (100 mg total) by mouth daily. 60 capsule 8   No current facility-administered medications on file prior to visit.     PAST MEDICAL HISTORY: Past Medical History:  Diagnosis Date  . Chlamydia   . Diabetes ([place])   . GERD (gastroesophageal reflux disease)   . Hyperlipidemia   . Hypertension   . Migraine   . Obstructive sleep apnea on CPAP    pt has not worn CPAP in years  . Seasonal allergies   . Wears glasses     PAST SURGICAL HISTORY: Past Surgical History:  Procedure Laterality Date  . ABDOMINAL HYSTERECTOMY N/A 07/16/2013   Procedure: HYSTERECTOMY ABDOMINAL;  Surgeon: Allyn Kenner, DO;  Location: [facility];  Service: Gynecology;  Laterality: N/A;  . BILATERAL SALPINGECTOMY Bilateral 07/16/2013   Procedure: BILATERAL SALPINGECTOMY;  Surgeon: Allyn Kenner, DO;  Location: [facility];  Service: Gynecology;  Laterality: Bilateral;  . CARPAL TUNNEL RELEASE  01/16/2012   Procedure: CARPAL TUNNEL RELEASE;  Surgeon: Nita Sells, MD;  Location: [facility];  Service: Orthopedics;  Laterality: Left;  . CARPAL TUNNEL RELEASE  02/20/2012   Procedure: CARPAL TUNNEL RELEASE;  Surgeon: Nita Sells, MD;  Location: [facility];  Service: Orthopedics;  Laterality: Right;  . COLONOSCOPY    . CYSTO WITH HYDRODISTENSION N/A 07/16/2013   Procedure:  CYSTOSCOPY/HYDRODISTENSION;  Surgeon: Reece Packer, MD;  Location: [facility];  Service: Urology;  Laterality: N/A;  . DILATION AND CURETTAGE OF UTERUS    . FOOT SURGERY    . HYSTEROSCOPY W/D&C  10/31/2011   Procedure: DILATATION AND CURETTAGE /HYSTEROSCOPY;  Surgeon: Allyn Kenner, DO;  Location: [facility];  Service: Gynecology;  Laterality: N/A;  . LAPAROSCOPY N/A 07/16/2013   Procedure: LAPAROSCOPY DIAGNOSTIC;  Surgeon: Allyn Kenner, DO;  Location: [facility];  Service: Gynecology;  Laterality: N/A;  . PLANTAR FASCIA SURGERY    . PUBOVAGINAL SLING N/A 07/16/2013   Procedure: Gaynelle Arabian;  Surgeon: Reece Packer, MD;  Location: [facility];  Service: Urology;  Laterality: N/A;  . TUBAL LIGATION    . VAGINAL DELIVERY  1995,  2003    SOCIAL HISTORY: Social History   Tobacco Use  . Smoking status: Never Smoker  . Smokeless tobacco: Never Used  Substance Use Topics  . Alcohol use: Yes    Comment: Rarely  . Drug use: No    FAMILY HISTORY: Family History  Problem Relation Age of Onset  .  Hypertension Mother   . Hyperlipidemia Mother   . Hypertension Father   . Hyperlipidemia Father   . Thyroid disease Father   . Hypertension Brother     ROS: Review of Systems  Constitutional: Positive for malaise/fatigue. Negative for weight loss.       + Trouble sleeping + Breasts discharge  HENT:       + Nasal stuffiness + Hay fever  Eyes:       + Wear glasses or contacts  Respiratory: Positive for shortness of breath (with exertion).   Cardiovascular: Positive for palpitations. Negative for orthopnea.  Gastrointestinal: Positive for constipation, heartburn and nausea.       + Rectal bleeding  Musculoskeletal:       + Muscle or joint pain  Neurological: Positive for headaches.  Psychiatric/Behavioral: Positive for depression. Negative for suicidal ideas.    PHYSICAL EXAM: Blood pressure (!) 185/116, pulse 86, temperature 97.9 F (36.6 C), temperature source Oral, height 5'  6" (1.676 m), weight 222 lb (100.7 kg), last menstrual period 06/27/2013, SpO2 99 %. Body mass index is 35.83 kg/m. Physical Exam  Constitutional: She is oriented to person, place, and time. She appears well-developed and well-nourished.  HENT:  Head: Normocephalic and atraumatic.  Nose: Nose normal.  Eyes: EOM are normal. No scleral icterus.  Neck: Normal range of motion. Neck supple. No thyromegaly present.  Cardiovascular: Normal rate and regular rhythm.  Pulmonary/Chest: Effort normal.  Abdominal: Soft. There is no tenderness.  + Obesity  Musculoskeletal:  Range of Motion normal in all 4 extremities Trace edema noted in bilateral lower extremities  Neurological: She is alert and oriented to person, place, and time. Coordination normal.  Skin: Skin is warm and dry.  Psychiatric: She has a normal mood and affect. Her behavior is normal.  Vitals reviewed.   RECENT LABS AND TESTS: BMET    Component Value Date/Time   NA 138 03/15/2017 1135   K 4.3 03/15/2017 1135   CL 101 03/15/2017 1135   CO2 20 03/15/2017 1135   GLUCOSE 157 (H) 03/15/2017 1135   GLUCOSE 138 (H) 06/16/2016 2029   BUN 14 03/15/2017 1135   CREATININE 0.79 03/15/2017 1135   CALCIUM 9.1 03/15/2017 1135   GFRNONAA 89 03/15/2017 1135   GFRAA 103 03/15/2017 1135   No results found for: HGBA1C No results found for: INSULIN CBC    Component Value Date/Time   WBC 12.0 (H) 06/16/2016 2029   RBC 5.01 06/16/2016 2029   HGB 12.9 06/16/2016 2029   HCT 40.3 06/16/2016 2029   PLT 384 06/16/2016 2029   MCV 80.4 06/16/2016 2029   MCH 25.7 (L) 06/16/2016 2029   MCHC 32.0 06/16/2016 2029   RDW 13.9 06/16/2016 2029   LYMPHSABS 2.2 09/02/2008 2227   MONOABS 0.4 09/02/2008 2227   EOSABS 0.1 09/02/2008 2227   BASOSABS 0.0 09/02/2008 2227   Iron/TIBC/Ferritin/ %Sat No results found for: IRON, TIBC, FERRITIN, IRONPCTSAT Lipid Panel  No results found for: CHOL, TRIG, HDL, CHOLHDL, VLDL, LDLCALC, LDLDIRECT Hepatic  Function Panel  No results found for: PROT, ALBUMIN, AST, ALT, ALKPHOS, BILITOT, BILIDIR, IBILI No results found for: TSH  ECG  shows NSR with a rate of 85 BPM INDIRECT CALORIMETER done today shows a VO2 of 278 and a REE of 1932.  Her calculated basal metabolic rate is 8264 thus her basal metabolic rate is better than expected.    ASSESSMENT AND PLAN: Other fatigue - Plan: EKG 12-Lead, Vitamin B12, CBC With Differential, Folate,  Lipid Panel With LDL/HDL Ratio, T3, T4, free, TSH, VITAMIN D 25 Hydroxy (Vit-D Deficiency, Fractures)  Shortness of breath on exertion - Plan: CBC With Differential  Type 2 diabetes mellitus without complication, without long-term current use of insulin ([place]) - Plan: Comprehensive metabolic panel, Hemoglobin A1c, Insulin, random, Microalbumin / creatinine urine ratio, glucose blood test strip, ONETOUCH DELICA LANCETS 54M MISC, Blood Glucose Monitoring Suppl ([place]) w/Device KIT  Essential hypertension  Depression screening  At risk for heart disease  Class 2 severe obesity with serious comorbidity and body mass index (BMI) of 35.0 to 35.9 in adult, unspecified obesity type (HCC)  PLAN:  Fatigue Emmie was informed that her fatigue may be related to obesity, depression or many other causes. Labs will be ordered, and in the meanwhile Thanh has agreed to work on diet, exercise and weight loss to help with fatigue. Proper sleep hygiene was discussed including the need for 7-8 hours of quality sleep each night. A sleep study was not ordered based on symptoms and Epworth score.  Dyspnea on exertion Dyesha's shortness of breath appears to be obesity related and exercise induced. She has agreed to work on weight loss and gradually increase exercise to treat her exercise induced shortness of breath. If Dorethy follows our instructions and loses weight without improvement of her shortness of breath, we will plan to refer to pulmonology. We will monitor  this condition regularly. Thomasa agrees to this plan.  Hypertension We discussed sodium restriction, working on healthy weight loss, and a regular exercise program as the means to achieve improved blood pressure control. Levern agreed with this plan and agreed to follow up as directed. We will continue to monitor her blood pressure as well as her progress with the above lifestyle modifications. We will check labs and Lamae agrees to take medications as prescribed and start diet, exercise, and weight loss plan. She will follow up in 2 weeks to discuss results and possible medication changes. She was given a pill organizer to help her remember her medications. She will watch for signs of hypotension as she continues her lifestyle modifications.  Diabetes II Angelie has been given extensive diabetes education by myself today including ideal fasting and post-prandial blood glucose readings, individual ideal Hgb A1c goals and hypoglycemia prevention. We discussed the importance of good blood sugar control to decrease the likelihood of diabetic complications such as nephropathy, neuropathy, limb loss, blindness, coronary artery disease, and death. We discussed the importance of intensive lifestyle modification including diet, exercise and weight loss as the first line treatment for diabetes. We will check labs and Anyla will check BGs BID and bring in log. She will start diet prescription and follow up in 2 weeks to discuss labs and treatment options. We have sent glucometer, strips, and lancets to her pharmacy.  Cardiovascular risk counselling Anouk was given extended (30 minutes) coronary artery disease prevention counseling today. She is 47 y.o. female and has risk factors for heart disease including obesity, hypertension, and diabetes II. We discussed intensive lifestyle modifications today with an emphasis on specific weight loss instructions and strategies. Pt was also informed of the importance of increasing  exercise and decreasing saturated fats to help prevent heart disease.  Depression Screen Lenny had a moderately positive depression screening. Depression is commonly associated with obesity and often results in emotional eating behaviors. We will monitor this closely and work on CBT to help improve the non-hunger eating patterns. Referral to Psychology may be required if no  improvement is seen as she continues in our clinic.  Obesity Kyndahl is currently in the action stage of change and her goal is to continue with weight loss efforts. I recommend Desree begin the structured treatment plan as follows:  She has agreed to follow the Category 3 plan Chrystle has been instructed to eventually work up to a goal of 150 minutes of combined cardio and strengthening exercise per week for weight loss and overall health benefits. We discussed the following Behavioral Modification Strategies today: increasing lean protein intake, decreasing simple carbohydrates  and work on meal planning and easy cooking plans   She was informed of the importance of frequent follow up visits to maximize her success with intensive lifestyle modifications for her multiple health conditions. She was informed we would discuss her lab results at her next visit unless there is a critical issue that needs to be addressed sooner. Armiyah agreed to keep her next visit at the agreed upon time to discuss these results.    OBESITY BEHAVIORAL INTERVENTION VISIT  Today's visit was # 1 out of 22.  Starting weight: 222 lbs Starting date: 05/25/17 Today's weight : 222 lbs  Today's date: 05/25/2017 Total lbs lost to date: 0 (Patients must lose 7 lbs in the first 6 months to continue with counseling)   ASK: We discussed the diagnosis of obesity with Bridget Anderson today and Tiondra agreed to give [place] permission to discuss obesity behavioral modification therapy today.  ASSESS: Sanora has the diagnosis of obesity and her BMI today is 35.85 Joliana  is in the action stage of change   ADVISE: Samina was educated on the multiple health risks of obesity as well as the benefit of weight loss to improve her health. She was advised of the need for long term treatment and the importance of lifestyle modifications.  AGREE: Multiple dietary modification options and treatment options were discussed and  Lavender agreed to the above obesity treatment plan.   I, Trixie Dredge, am acting as transcriptionist for Dennard Nip, MD   I have reviewed the above documentation for accuracy and completeness, and I agree with the above. -Dennard Nip, MD

## 2017-05-26 LAB — MICROALBUMIN / CREATININE URINE RATIO
Creatinine, Urine: 156.5 mg/dL
MICROALB/CREAT RATIO: 27.4 mg/g{creat} (ref 0.0–30.0)
Microalbumin, Urine: 42.9 ug/mL

## 2017-05-26 LAB — COMPREHENSIVE METABOLIC PANEL
ALK PHOS: 90 IU/L (ref 39–117)
ALT: 13 IU/L (ref 0–32)
AST: 13 IU/L (ref 0–40)
Albumin/Globulin Ratio: 1.1 — ABNORMAL LOW (ref 1.2–2.2)
Albumin: 4 g/dL (ref 3.5–5.5)
BILIRUBIN TOTAL: 0.4 mg/dL (ref 0.0–1.2)
BUN/Creatinine Ratio: 14 (ref 9–23)
BUN: 10 mg/dL (ref 6–24)
CHLORIDE: 98 mmol/L (ref 96–106)
CO2: 25 mmol/L (ref 20–29)
CREATININE: 0.74 mg/dL (ref 0.57–1.00)
Calcium: 9 mg/dL (ref 8.7–10.2)
GFR calc Af Amer: 112 mL/min/{1.73_m2} (ref >59)
GFR calc non Af Amer: 97 mL/min/{1.73_m2} (ref >59)
GLUCOSE: 156 mg/dL — AB (ref 65–99)
Globulin, Total: 3.6 g/dL (ref 1.5–4.5)
Potassium: 4.1 mmol/L (ref 3.5–5.2)
Sodium: 135 mmol/L (ref 134–144)
Total Protein: 7.6 g/dL (ref 6.0–8.5)

## 2017-05-26 LAB — CBC WITH DIFFERENTIAL
BASOS ABS: 0 10*3/uL (ref 0.0–0.2)
Basos: 0 %
EOS (ABSOLUTE): 0.2 10*3/uL (ref 0.0–0.4)
Eos: 2 %
Hematocrit: 39.7 % (ref 34.0–46.6)
Hemoglobin: 12.6 g/dL (ref 11.1–15.9)
IMMATURE GRANULOCYTES: 0 %
Immature Grans (Abs): 0 10*3/uL (ref 0.0–0.1)
LYMPHS ABS: 3.7 10*3/uL — AB (ref 0.7–3.1)
Lymphs: 38 %
MCH: 25.7 pg — ABNORMAL LOW (ref 26.6–33.0)
MCHC: 31.7 g/dL (ref 31.5–35.7)
MCV: 81 fL (ref 79–97)
MONOS ABS: 0.4 10*3/uL (ref 0.1–0.9)
Monocytes: 4 %
Neutrophils Absolute: 5.2 10*3/uL (ref 1.4–7.0)
Neutrophils: 56 %
RBC: 4.9 x10E6/uL (ref 3.77–5.28)
RDW: 14.2 % (ref 12.3–15.4)
WBC: 9.5 10*3/uL (ref 3.4–10.8)

## 2017-05-26 LAB — HEMOGLOBIN A1C
Est. average glucose Bld gHb Est-mCnc: 189 mg/dL
HEMOGLOBIN A1C: 8.2 % — AB (ref 4.8–5.6)

## 2017-05-26 LAB — LIPID PANEL WITH LDL/HDL RATIO
CHOLESTEROL TOTAL: 242 mg/dL — AB (ref 100–199)
HDL: 44 mg/dL (ref >39)
LDL CALC: 178 mg/dL — AB (ref 0–99)
LDl/HDL Ratio: 4 ratio — ABNORMAL HIGH (ref 0.0–3.2)
Triglycerides: 101 mg/dL (ref 0–149)
VLDL Cholesterol Cal: 20 mg/dL (ref 5–40)

## 2017-05-26 LAB — T4, FREE: FREE T4: 0.9 ng/dL (ref 0.82–1.77)

## 2017-05-26 LAB — FOLATE: FOLATE: 13.1 ng/mL (ref >3.0)

## 2017-05-26 LAB — T3: T3 TOTAL: 131 ng/dL (ref 71–180)

## 2017-05-26 LAB — VITAMIN B12: VITAMIN B 12: 701 pg/mL (ref 232–1245)

## 2017-05-26 LAB — INSULIN, RANDOM: INSULIN: 10.6 u[IU]/mL (ref 2.6–24.9)

## 2017-05-26 LAB — VITAMIN D 25 HYDROXY (VIT D DEFICIENCY, FRACTURES): Vit D, 25-Hydroxy: 6.7 ng/mL — ABNORMAL LOW (ref 30.0–100.0)

## 2017-05-26 LAB — TSH: TSH: 1.3 u[IU]/mL (ref 0.450–4.500)

## 2017-06-07 ENCOUNTER — Ambulatory Visit (INDEPENDENT_AMBULATORY_CARE_PROVIDER_SITE_OTHER): Payer: 59 | Admitting: Neurology

## 2017-06-07 DIAGNOSIS — G4710 Hypersomnia, unspecified: Secondary | ICD-10-CM

## 2017-06-07 DIAGNOSIS — R0683 Snoring: Secondary | ICD-10-CM

## 2017-06-07 DIAGNOSIS — G4719 Other hypersomnia: Secondary | ICD-10-CM

## 2017-06-08 ENCOUNTER — Ambulatory Visit (INDEPENDENT_AMBULATORY_CARE_PROVIDER_SITE_OTHER): Payer: 59 | Admitting: Family Medicine

## 2017-06-08 VITALS — BP 166/95 | HR 74 | Temp 98.4°F | Ht 66.0 in | Wt 219.0 lb

## 2017-06-08 DIAGNOSIS — E559 Vitamin D deficiency, unspecified: Secondary | ICD-10-CM

## 2017-06-08 DIAGNOSIS — I10 Essential (primary) hypertension: Secondary | ICD-10-CM

## 2017-06-08 DIAGNOSIS — E119 Type 2 diabetes mellitus without complications: Secondary | ICD-10-CM | POA: Diagnosis not present

## 2017-06-08 DIAGNOSIS — Z9189 Other specified personal risk factors, not elsewhere classified: Secondary | ICD-10-CM | POA: Diagnosis not present

## 2017-06-08 DIAGNOSIS — Z6835 Body mass index (BMI) 35.0-35.9, adult: Secondary | ICD-10-CM

## 2017-06-08 DIAGNOSIS — E7849 Other hyperlipidemia: Secondary | ICD-10-CM

## 2017-06-08 MED ORDER — CHLORTHALIDONE 25 MG PO TABS: 25.0000 mg | ORAL_TABLET | Freq: Every day | ORAL | 0 refills | Status: DC

## 2017-06-08 MED ORDER — ATORVASTATIN CALCIUM 10 MG PO TABS: 10.0000 mg | ORAL_TABLET | Freq: Every day | ORAL | 0 refills | Status: DC

## 2017-06-08 MED ORDER — VITAMIN D (ERGOCALCIFEROL) 1.25 MG (50000 UNIT) PO CAPS: 50000.0000 [IU] | ORAL_CAPSULE | ORAL | 0 refills | Status: DC

## 2017-06-11 NOTE — Progress Notes (Signed)
Office: [phone]  /  Fax: [phone]   HPI:   Chief Complaint: OBESITY Bridget Anderson is here to discuss her progress with her obesity treatment plan. She is on the Category 3 plan and is following her eating plan approximately 60 % of the time. She states she is exercising 0 minutes 0 times per week. Bridget Anderson struggled to follow Category 3 plan, stated she had a hard time eating all her food but then states she had excessive hunger.  Her weight is 219 lb (99.3 kg) today and has had a weight loss of 3 pounds over a period of 2 weeks since her last visit. She has lost 3 lbs since starting treatment with [place].  Hyperlipidemia Tarisa has hyperlipidemia and has been trying to improve her cholesterol levels with intensive lifestyle modification including a low saturated fat diet, exercise and weight loss. She is on Lipitor, LDL very elevated. She is at higher risk of coronary artery disease. She denies any chest pain, claudication or myalgias.  Hypertension Bridget Anderson is a 47 y.o. female with hypertension. Bridget Anderson's blood pressure improved from last visit but she is missing many doses. She denies chest pain. She is working weight loss to help control her blood pressure with the goal of decreasing her risk of heart attack and stroke. Bridget Anderson's blood pressure is not currently controlled.  At risk for cardiovascular disease Bridget Anderson is at a higher than average risk for cardiovascular disease due to obesity, hyperlipidemia, and hypertension. She currently denies any chest pain.  Vitamin D Deficiency Bridget Anderson has a diagnosis of vitamin D deficiency. She is not on Vit D, she notes fatigue and denies nausea, vomiting or muscle weakness.  Diabetes II Bridget Anderson has a diagnosis of diabetes type II. Bridget Anderson's diabetes is uncontrolled, she has not been taking metformin but did start her diet prescription. She is struggling with polyphagia. She states fasting BGs range between 130 and 185. She denies any hypoglycemic episodes.  Last A1c was 8.2 on 05/25/17. She has been working on intensive lifestyle modifications including diet, exercise, and weight loss to help control her blood glucose levels.  ALLERGIES: Allergies  Allergen Reactions  . Shellfish Allergy Anaphylaxis    Allergic to some shellfish but all regular fish.  . Prednisone     Headache    MEDICATIONS: Current Outpatient Medications on File Prior to Visit  Medication Sig Dispense Refill  . Blood Glucose Monitoring Suppl (ONETOUCH VERIO FLEX SYSTEM) w/Device KIT 1 kit by Does not apply route 2 (two) times daily. Please substitute for what is covered by patient insurance plan 1 kit 0  . carvedilol (COREG) 12.5 MG tablet Take 18.75 mg by mouth 2 (two) times daily with a meal.     . glucose blood test strip Twice daily 100 each 0  . metFORMIN (GLUCOPHAGE) 500 MG tablet Take 500 mg by mouth daily with breakfast.    . ONETOUCH DELICA LANCETS 29F MISC 1 Package by Does not apply route 2 (two) times daily. 100 each 0  . rizatriptan (MAXALT) 10 MG tablet Take 1 tablet (10 mg total) by mouth as needed for migraine. May repeat in 2 hours if needed 10 tablet 6  . zonisamide (ZONEGRAN) 50 MG capsule Take 2 capsules (100 mg total) by mouth daily. 60 capsule 8   No current facility-administered medications on file prior to visit.     PAST MEDICAL HISTORY: Past Medical History:  Diagnosis Date  . Chlamydia   . Diabetes ([place])   . GERD (  gastroesophageal reflux disease)   . Hyperlipidemia   . Hypertension   . Migraine   . Obstructive sleep apnea on CPAP    pt has not worn CPAP in years  . Seasonal allergies   . Wears glasses     PAST SURGICAL HISTORY: Past Surgical History:  Procedure Laterality Date  . ABDOMINAL HYSTERECTOMY N/A 07/16/2013   Procedure: HYSTERECTOMY ABDOMINAL;  Surgeon: Allyn Kenner, DO;  Location: [facility];  Service: Gynecology;  Laterality: N/A;  . BILATERAL SALPINGECTOMY Bilateral 07/16/2013   Procedure: BILATERAL SALPINGECTOMY;   Surgeon: Allyn Kenner, DO;  Location: [facility];  Service: Gynecology;  Laterality: Bilateral;  . CARPAL TUNNEL RELEASE  01/16/2012   Procedure: CARPAL TUNNEL RELEASE;  Surgeon: Nita Sells, MD;  Location: [facility];  Service: Orthopedics;  Laterality: Left;  . CARPAL TUNNEL RELEASE  02/20/2012   Procedure: CARPAL TUNNEL RELEASE;  Surgeon: Nita Sells, MD;  Location: [facility];  Service: Orthopedics;  Laterality: Right;  . COLONOSCOPY    . CYSTO WITH HYDRODISTENSION N/A 07/16/2013   Procedure: CYSTOSCOPY/HYDRODISTENSION;  Surgeon: Reece Packer, MD;  Location: [facility];  Service: Urology;  Laterality: N/A;  . DILATION AND CURETTAGE OF UTERUS    . FOOT SURGERY    . HYSTEROSCOPY W/D&C  10/31/2011   Procedure: DILATATION AND CURETTAGE /HYSTEROSCOPY;  Surgeon: Allyn Kenner, DO;  Location: [facility];  Service: Gynecology;  Laterality: N/A;  . LAPAROSCOPY N/A 07/16/2013   Procedure: LAPAROSCOPY DIAGNOSTIC;  Surgeon: Allyn Kenner, DO;  Location: [facility];  Service: Gynecology;  Laterality: N/A;  . PLANTAR FASCIA SURGERY    . PUBOVAGINAL SLING N/A 07/16/2013   Procedure: Gaynelle Arabian;  Surgeon: Reece Packer, MD;  Location: [facility];  Service: Urology;  Laterality: N/A;  . TUBAL LIGATION    . VAGINAL DELIVERY  1995,  2003    SOCIAL HISTORY: Social History   Tobacco Use  . Smoking status: Never Smoker  . Smokeless tobacco: Never Used  Substance Use Topics  . Alcohol use: Yes    Comment: Rarely  . Drug use: No    FAMILY HISTORY: Family History  Problem Relation Age of Onset  . Hypertension Mother   . Hyperlipidemia Mother   . Hypertension Father   . Hyperlipidemia Father   . Thyroid disease Father   . Hypertension Brother     ROS: Review of Systems  Constitutional: Positive for malaise/fatigue and weight loss.  Cardiovascular: Negative for chest pain and claudication.  Gastrointestinal: Negative for nausea and  vomiting.  Musculoskeletal: Negative for myalgias.       Negative muscle weakness  Endo/Heme/Allergies:       Positive polyphagia Negative hypoglycemia    PHYSICAL EXAM: Blood pressure (!) 166/95, pulse 74, temperature 98.4 F (36.9 C), temperature source Oral, height 5' 6" (1.676 m), weight 219 lb (99.3 kg), last menstrual period 06/27/2013, SpO2 100 %. Body mass index is 35.35 kg/m. Physical Exam  Constitutional: She is oriented to person, place, and time. She appears well-developed and well-nourished.  Cardiovascular: Normal rate.  Pulmonary/Chest: Effort normal.  Musculoskeletal: Normal range of motion.  Neurological: She is oriented to person, place, and time.  Skin: Skin is warm and dry.  Psychiatric: She has a normal mood and affect. Her behavior is normal.  Vitals reviewed.   RECENT LABS AND TESTS: BMET    Component Value Date/Time   NA 135 05/25/2017 1022   K 4.1 05/25/2017 1022   CL 98 05/25/2017 1022  CO2 25 05/25/2017 1022   GLUCOSE 156 (H) 05/25/2017 1022   GLUCOSE 138 (H) 06/16/2016 2029   BUN 10 05/25/2017 1022   CREATININE 0.74 05/25/2017 1022   CALCIUM 9.0 05/25/2017 1022   GFRNONAA 97 05/25/2017 1022   GFRAA 112 05/25/2017 1022   Lab Results  Component Value Date   HGBA1C 8.2 (H) 05/25/2017   Lab Results  Component Value Date   INSULIN 10.6 05/25/2017   CBC    Component Value Date/Time   WBC 9.5 05/25/2017 1022   WBC 12.0 (H) 06/16/2016 2029   RBC 4.90 05/25/2017 1022   RBC 5.01 06/16/2016 2029   HGB 12.6 05/25/2017 1022   HCT 39.7 05/25/2017 1022   PLT 384 06/16/2016 2029   MCV 81 05/25/2017 1022   MCH 25.7 (L) 05/25/2017 1022   MCH 25.7 (L) 06/16/2016 2029   MCHC 31.7 05/25/2017 1022   MCHC 32.0 06/16/2016 2029   RDW 14.2 05/25/2017 1022   LYMPHSABS 3.7 (H) 05/25/2017 1022   MONOABS 0.4 09/02/2008 2227   EOSABS 0.2 05/25/2017 1022   BASOSABS 0.0 05/25/2017 1022   Iron/TIBC/Ferritin/ %Sat No results found for: IRON, TIBC,  FERRITIN, IRONPCTSAT Lipid Panel     Component Value Date/Time   CHOL 242 (H) 05/25/2017 1022   TRIG 101 05/25/2017 1022   HDL 44 05/25/2017 1022   LDLCALC 178 (H) 05/25/2017 1022   Hepatic Function Panel     Component Value Date/Time   PROT 7.6 05/25/2017 1022   ALBUMIN 4.0 05/25/2017 1022   AST 13 05/25/2017 1022   ALT 13 05/25/2017 1022   ALKPHOS 90 05/25/2017 1022   BILITOT 0.4 05/25/2017 1022      Component Value Date/Time   TSH 1.300 05/25/2017 1022  Results for YOSELINE, ANDERSSON (MRN [id number]) as of 06/11/2017 15:57  Ref. Range 05/25/2017 10:22  Vitamin D, 25-Hydroxy Latest Ref Range: 30.0 - 100.0 ng/mL 6.7 (L)    ASSESSMENT AND PLAN: Other hyperlipidemia - Plan: atorvastatin (LIPITOR) 10 MG tablet  Essential hypertension - Plan: chlorthalidone (HYGROTON) 25 MG tablet  Vitamin D deficiency - Plan: Vitamin D, Ergocalciferol, (DRISDOL) 50000 units CAPS capsule  Type 2 diabetes mellitus without complication, without long-term current use of insulin (HCC)  At risk for heart disease  Class 2 severe obesity with serious comorbidity and body mass index (BMI) of 35.0 to 35.9 in adult, unspecified obesity type ([place])  PLAN:  Hyperlipidemia Bridget Anderson was informed of the American Heart Association Guidelines emphasizing intensive lifestyle modifications as the first line treatment for hyperlipidemia. We discussed many lifestyle modifications today in depth, and Bridget Anderson will continue to work on decreasing saturated fats such as fatty red meat, butter and many fried foods. She will also increase vegetables and lean protein in her diet and continue to work on diet, exercise and weight loss efforts. Bridget Anderson was educated on diabetes mellitus and increase risk of coronary artery disease and the need for statins. Bridget Anderson agrees to restart Lipitor 10 mg qhs #30 with no refills. Bridget Anderson agrees to follow up with our clinic in 2 to 3 weeks.  Hypertension We discussed sodium restriction, working on  healthy weight loss, and a regular exercise program as the means to achieve improved blood pressure control. Bridget Anderson agreed with this plan and agreed to follow up as directed. We will continue to monitor her blood pressure as well as her progress with the above lifestyle modifications. Bridget Anderson agrees to continue taking Coreg 18.75 mg BID #60 and we will refill for  1 month and she agrees to continue chlorthalidone 25 mg qd #30 and we will refill for 1 month. Pill box was given to patient today. She will continue diet and exercise and she will watch for signs of hypotension as she continues her lifestyle modifications. Bridget Anderson agrees to follow up with our clinic in 2 to 3 weeks.  Cardiovascular risk counselling Bridget Anderson was given extended (30 minutes) coronary artery disease prevention counseling today. She is 47 y.o. female and has risk factors for heart disease including obesity, hyperlipidemia, and hypertension. We discussed intensive lifestyle modifications today with an emphasis on specific weight loss instructions and strategies. Pt was also informed of the importance of increasing exercise and decreasing saturated fats to help prevent heart disease.  Vitamin D Deficiency Bridget Anderson was informed that low vitamin D levels contributes to fatigue and are associated with obesity, breast, and colon cancer. Bridget Anderson agrees to restart prescription Vit D _0 ,000 IU every week #4 with no refills. She will follow up for routine testing of vitamin D, at least 2-3 times per year. She was informed of the risk of over-replacement of vitamin D and agrees to not increase her dose unless she discusses this with [place] first. We will recheck labs in 3 months and Bridget Anderson agrees to follow up with our clinic in 2 to 3 weeks.  Diabetes II Bridget Anderson has been given extensive diabetes education by myself today including ideal fasting and post-prandial blood glucose readings, individual ideal Hgb A1c goals and hypoglycemia prevention. We discussed the  importance of good blood sugar control to decrease the likelihood of diabetic complications such as nephropathy, neuropathy, limb loss, blindness, coronary artery disease, and death. We discussed the importance of intensive lifestyle modification including diet, exercise and weight loss as the first line treatment for diabetes. Bridget Anderson agrees to continue her diabetes medications and she was given a pill box to help her remember to take her medication; she agreed to start metformin, no refill needed and continue diet, exercise, and weight loss. Bridget Anderson agrees to follow up with our clinic in 2 to 3 weeks.  Obesity Marja is currently in the action stage of change. As such, her goal is to continue with weight loss efforts She has agreed to follow the Category 3 plan Adonai has been instructed to work up to a goal of 150 minutes of combined cardio and strengthening exercise per week for weight loss and overall health benefits. We discussed the following Behavioral Modification Strategies today: increasing lean protein intake, decreasing simple carbohydrates, and no skipping meals    Shimika has agreed to follow up with our clinic in 2 to 3 weeks. She was informed of the importance of frequent follow up visits to maximize her success with intensive lifestyle modifications for her multiple health conditions.   OBESITY BEHAVIORAL INTERVENTION VISIT  Today's visit was # 2 out of 22.  Starting weight: 222 lbs Starting date: 05/25/17 Today's weight : 219 lbs  Today's date: 06/08/2017 Total lbs lost to date: 3 (Patients must lose 7 lbs in the first 6 months to continue with counseling)   ASK: We discussed the diagnosis of obesity with Jenna Luo today and Khristie agreed to give [place] permission to discuss obesity behavioral modification therapy today.  ASSESS: Cydni has the diagnosis of obesity and her BMI today is 35.36 Lavon is in the action stage of change   ADVISE: Mikah was educated on the multiple  health risks of obesity as well as the benefit of weight loss  to improve her health. She was advised of the need for long term treatment and the importance of lifestyle modifications.  AGREE: Multiple dietary modification options and treatment options were discussed and  Jamiaya agreed to the above obesity treatment plan.  I, Trixie Dredge, am acting as transcriptionist for Dennard Nip, MD  I have reviewed the above documentation for accuracy and completeness, and I agree with the above. -Dennard Nip, MD

## 2017-06-12 MED ORDER — CARVEDILOL 12.5 MG PO TABS: 18.7500 mg | ORAL_TABLET | Freq: Two times a day (BID) | ORAL | 0 refills | Status: DC

## 2017-06-20 ENCOUNTER — Telehealth: Payer: Self-pay | Admitting: *Deleted

## 2017-06-20 ENCOUNTER — Telehealth: Payer: Self-pay | Admitting: Neurology

## 2017-06-20 ENCOUNTER — Ambulatory Visit: Payer: 59 | Admitting: Neurology

## 2017-06-20 ENCOUNTER — Encounter: Payer: Self-pay | Admitting: Neurology

## 2017-06-20 NOTE — Telephone Encounter (Signed)
-----   Message from Larey Seat, MD sent at 06/20/2017 11:39 AM EDT ----- IMPRESSION: Mild snoring and very mild apnea according to this  HST on apnea link. No hypoxemia.  RECOMMENDATION: I would recommend weight loss, avoiding supine  sleep (tennis ball method), and no intervention by CPAP. If  snoring remains a sleep disturbing factor, a dental device should  be considered. Her sleepiness is much too high to be explained by  these benign findings and warrants follow up with PSG and MSLT  for narcolepsy.   Cc Dr Woody Seller.

## 2017-06-20 NOTE — Telephone Encounter (Signed)
Called the pt and made her aware of the sleep study results. I informed her that Dr Brett Fairy did not see apnea of any concern and she had no VS issues. I have offered a few options if the pt wants to proceed forward. Dr Brett Fairy states that if snoring is of concern then we can order a dental referral for a dental device to help with treating snoring. I also informed her that Dr Brett Fairy states that if sleepiness in daytime is still present and high we can do testing to rule out for narcolepsy. Pt verbalized understanding. Pt had no questions at this time but was encouraged to call back if questions arise.

## 2017-06-20 NOTE — Addendum Note (Signed)
Addended by: Larey Seat on: 06/20/2017 11:39 AM   Modules accepted: Orders

## 2017-06-20 NOTE — Telephone Encounter (Signed)
Pt no showed f/u appt on 06/20/2017 @ 3:00 pm.

## 2017-06-20 NOTE — Procedures (Signed)
NAME:  Bridget Anderson                                                                   DOB: 03-Jan-1971 MEDICAL RECORD No:  [id number]                                        DOS: 06/07/2017 REFERRING PHYSICIAN: Sarina Ill, MD STUDY PERFORMED: Home Sleep Study by apnea link HISTORY: [name] is a 47 y.o. female patient of Dr. Cathren Laine, who has continuously gained weight over the last 15 years after the birth of her youngest daughter. 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 The patient had previously undergone a sleep study about 7 years ago, which resulted in the diagnosis of obstructive sleep apnea and she was given CPAP.  She states that she developed increasing difficulties to tolerate it and just gave up- she was on a FFM.   She endorsed a very high degree of daytime sleepiness referring to her Epworth sleepiness score of 19.5 out of 24 possible points. Given that weight gain of about 30 pounds since last tested, her African-American race, and retrognathia, she is likely to have OSA. She is also a swing shift worker, which affects sleep pattern, circadian rhythm and metabolism. BMI: 36.3.  STUDY RESULTS:  Total Recording Time: 8 hours, 35 minutes. Valid test time was 6h and 41 minutes. Total Apnea/Hypopnea Index (AHI): 2.1 /h.  RDI was 4.1 /h. Average Oxygen Saturation: 95 %, Lowest Oxygen Desaturation: 85 %.  Total Time Oxygen Saturation below 89 % was 1.0 minute.  Average Heart Rate:  66 bpm (between 50 and 106 bpm) IMPRESSION: Mild snoring and very mild apnea according to this HST on apnea link. No hypoxemia.  RECOMMENDATION: I would recommend weight loss, avoiding supine sleep (tennis ball method), and no intervention by CPAP. If snoring remains a sleep disturbing factor, a dental device should be considered. Her sleepiness is much too high to be explained by these benign findings and  warrants follow up with PSG and MSLT for narcolepsy.  I certify that I have reviewed the raw data recording prior to the issuance of this report in accordance with the standards of the American Academy of Sleep Medicine (AASM). Larey Seat, M.D.   06-20-2017    Medical Director of [facility] at [facility], accredited by the AASM. Diplomat of the ABPN and ABSM.

## 2017-06-28 ENCOUNTER — Ambulatory Visit (INDEPENDENT_AMBULATORY_CARE_PROVIDER_SITE_OTHER): Payer: 59 | Admitting: Physician Assistant

## 2017-06-28 VITALS — BP 176/111 | HR 78 | Temp 98.5°F | Ht 66.0 in | Wt 220.0 lb

## 2017-06-28 DIAGNOSIS — E1165 Type 2 diabetes mellitus with hyperglycemia: Secondary | ICD-10-CM | POA: Diagnosis not present

## 2017-06-28 DIAGNOSIS — Z6835 Body mass index (BMI) 35.0-35.9, adult: Secondary | ICD-10-CM

## 2017-06-28 DIAGNOSIS — Z9189 Other specified personal risk factors, not elsewhere classified: Secondary | ICD-10-CM

## 2017-06-28 DIAGNOSIS — I160 Hypertensive urgency: Secondary | ICD-10-CM | POA: Diagnosis not present

## 2017-06-29 NOTE — Progress Notes (Signed)
Office: [phone]  /  Fax: [phone]   HPI:   Chief Complaint: OBESITY Bridget Anderson is here to discuss her progress with her obesity treatment plan. She is on the Category 3 plan and is following her eating plan approximately 50 % of the time. She states she is exercising 0 minutes 0 times per week. Bridget Anderson continues to have challenges following the meal plan and get the recommended protein.  Her weight is 220 lb (99.8 kg) today and has gained 1 pound since her last visit. She has lost 2 lbs since starting treatment with [place].  Diabetes II Bridget Anderson has a diagnosis of diabetes type II. Bridget Anderson states fasting BGs range in 190's and post prandials range between 170 and 180. She denies any hypoglycemic episodes. She follows up with Endocrinologist in [place], [place]. She is on metformin 500 mg, declines any increase or adjustment of her medications. Last A1c was 8.2 on 05/25/17. She has been working on intensive lifestyle modifications including diet, exercise, and weight loss to help control her blood glucose levels.  Hypertensive urgency Bridget Anderson's blood pressure is elevated. She states she hasn't taken her blood pressure medications this morning. Repeat blood pressure after she took her medications in the clinic is 188/126. She complains of headache. Bridget Anderson is advised to go to the emergency department for management of her elevated blood pressure but refused (signed declinations letter, scanned). She is working weight loss to help control her blood pressure with the goal of decreasing her risk of heart attack and stroke. Bridget Anderson's blood pressure is not currently controlled.  At risk for cardiovascular disease Bridget Anderson is at a higher than average risk for cardiovascular disease due to obesity. She currently denies any chest pain.  ALLERGIES: Allergies  Allergen Reactions  . Shellfish Allergy Anaphylaxis    Allergic to some shellfish but all regular fish.  . Prednisone     Headache     MEDICATIONS: Current Outpatient Medications on File Prior to Visit  Medication Sig Dispense Refill  . atorvastatin (LIPITOR) 10 MG tablet Take 1 tablet (10 mg total) by mouth daily. 30 tablet 0  . Blood Glucose Monitoring Suppl (ONETOUCH VERIO FLEX SYSTEM) w/Device KIT 1 kit by Does not apply route 2 (two) times daily. Please substitute for what is covered by patient insurance plan 1 kit 0  . carvedilol (COREG) 12.5 MG tablet Take 1.5 tablets (18.75 mg total) by mouth 2 (two) times daily with a meal. 60 tablet 0  . chlorthalidone (HYGROTON) 25 MG tablet Take 1 tablet (25 mg total) by mouth daily. 30 tablet 0  . glucose blood test strip Twice daily 100 each 0  . metFORMIN (GLUCOPHAGE) 500 MG tablet Take 500 mg by mouth daily with breakfast.    . ONETOUCH DELICA LANCETS 18E MISC 1 Package by Does not apply route 2 (two) times daily. 100 each 0  . rizatriptan (MAXALT) 10 MG tablet Take 1 tablet (10 mg total) by mouth as needed for migraine. May repeat in 2 hours if needed 10 tablet 6  . Vitamin D, Ergocalciferol, (DRISDOL) 50000 units CAPS capsule Take 1 capsule (50,000 Units total) by mouth every 7 (seven) days. 4 capsule 0  . zonisamide (ZONEGRAN) 50 MG capsule Take 2 capsules (100 mg total) by mouth daily. 60 capsule 8   No current facility-administered medications on file prior to visit.     PAST MEDICAL HISTORY: Past Medical History:  Diagnosis Date  . Chlamydia   . Diabetes ([place])   . GERD (  gastroesophageal reflux disease)   . Hyperlipidemia   . Hypertension   . Migraine   . Obstructive sleep apnea on CPAP    pt has not worn CPAP in years  . Seasonal allergies   . Wears glasses     PAST SURGICAL HISTORY: Past Surgical History:  Procedure Laterality Date  . ABDOMINAL HYSTERECTOMY N/A 07/16/2013   Procedure: HYSTERECTOMY ABDOMINAL;  Surgeon: Allyn Kenner, DO;  Location: [facility];  Service: Gynecology;  Laterality: N/A;  . BILATERAL SALPINGECTOMY Bilateral 07/16/2013    Procedure: BILATERAL SALPINGECTOMY;  Surgeon: Allyn Kenner, DO;  Location: [facility];  Service: Gynecology;  Laterality: Bilateral;  . CARPAL TUNNEL RELEASE  01/16/2012   Procedure: CARPAL TUNNEL RELEASE;  Surgeon: Nita Sells, MD;  Location: [facility];  Service: Orthopedics;  Laterality: Left;  . CARPAL TUNNEL RELEASE  02/20/2012   Procedure: CARPAL TUNNEL RELEASE;  Surgeon: Nita Sells, MD;  Location: [facility];  Service: Orthopedics;  Laterality: Right;  . COLONOSCOPY    . CYSTO WITH HYDRODISTENSION N/A 07/16/2013   Procedure: CYSTOSCOPY/HYDRODISTENSION;  Surgeon: Reece Packer, MD;  Location: [facility];  Service: Urology;  Laterality: N/A;  . DILATION AND CURETTAGE OF UTERUS    . FOOT SURGERY    . HYSTEROSCOPY W/D&C  10/31/2011   Procedure: DILATATION AND CURETTAGE /HYSTEROSCOPY;  Surgeon: Allyn Kenner, DO;  Location: [facility];  Service: Gynecology;  Laterality: N/A;  . LAPAROSCOPY N/A 07/16/2013   Procedure: LAPAROSCOPY DIAGNOSTIC;  Surgeon: Allyn Kenner, DO;  Location: [facility];  Service: Gynecology;  Laterality: N/A;  . PLANTAR FASCIA SURGERY    . PUBOVAGINAL SLING N/A 07/16/2013   Procedure: Gaynelle Arabian;  Surgeon: Reece Packer, MD;  Location: [facility];  Service: Urology;  Laterality: N/A;  . TUBAL LIGATION    . VAGINAL DELIVERY  1995,  2003    SOCIAL HISTORY: Social History   Tobacco Use  . Smoking status: Never Smoker  . Smokeless tobacco: Never Used  Substance Use Topics  . Alcohol use: Yes    Comment: Rarely  . Drug use: No    FAMILY HISTORY: Family History  Problem Relation Age of Onset  . Hypertension Mother   . Hyperlipidemia Mother   . Hypertension Father   . Hyperlipidemia Father   . Thyroid disease Father   . Hypertension Brother     ROS: Review of Systems  Constitutional: Negative for weight loss.  Cardiovascular: Negative for chest pain.  Neurological: Positive for headaches.   Endo/Heme/Allergies:       Negative hypoglycemia    PHYSICAL EXAM: Blood pressure (!) 176/111, pulse 78, temperature 98.5 F (36.9 C), temperature source Oral, height '5\' 6"'$  (1.676 m), weight 220 lb (99.8 kg), last menstrual period 06/27/2013, SpO2 99 %. Body mass index is 35.51 kg/m. Physical Exam  Constitutional: She is oriented to person, place, and time. She appears well-developed and well-nourished.  Cardiovascular: Normal rate.  Pulmonary/Chest: Effort normal.  Musculoskeletal: Normal range of motion.  Neurological: She is oriented to person, place, and time.  Skin: Skin is warm and dry.  Psychiatric: She has a normal mood and affect. Her behavior is normal.  Vitals reviewed.   RECENT LABS AND TESTS: BMET    Component Value Date/Time   NA 135 05/25/2017 1022   K 4.1 05/25/2017 1022   CL 98 05/25/2017 1022   CO2 25 05/25/2017 1022   GLUCOSE 156 (H) 05/25/2017 1022   GLUCOSE 138 (H) 06/16/2016 2029   BUN  10 05/25/2017 1022   CREATININE 0.74 05/25/2017 1022   CALCIUM 9.0 05/25/2017 1022   GFRNONAA 97 05/25/2017 1022   GFRAA 112 05/25/2017 1022   Lab Results  Component Value Date   HGBA1C 8.2 (H) 05/25/2017   Lab Results  Component Value Date   INSULIN 10.6 05/25/2017   CBC    Component Value Date/Time   WBC 9.5 05/25/2017 1022   WBC 12.0 (H) 06/16/2016 2029   RBC 4.90 05/25/2017 1022   RBC 5.01 06/16/2016 2029   HGB 12.6 05/25/2017 1022   HCT 39.7 05/25/2017 1022   PLT 384 06/16/2016 2029   MCV 81 05/25/2017 1022   MCH 25.7 (L) 05/25/2017 1022   MCH 25.7 (L) 06/16/2016 2029   MCHC 31.7 05/25/2017 1022   MCHC 32.0 06/16/2016 2029   RDW 14.2 05/25/2017 1022   LYMPHSABS 3.7 (H) 05/25/2017 1022   MONOABS 0.4 09/02/2008 2227   EOSABS 0.2 05/25/2017 1022   BASOSABS 0.0 05/25/2017 1022   Iron/TIBC/Ferritin/ %Sat No results found for: IRON, TIBC, FERRITIN, IRONPCTSAT Lipid Panel     Component Value Date/Time   CHOL 242 (H) 05/25/2017 1022   TRIG 101  05/25/2017 1022   HDL 44 05/25/2017 1022   LDLCALC 178 (H) 05/25/2017 1022   Hepatic Function Panel     Component Value Date/Time   PROT 7.6 05/25/2017 1022   ALBUMIN 4.0 05/25/2017 1022   AST 13 05/25/2017 1022   ALT 13 05/25/2017 1022   ALKPHOS 90 05/25/2017 1022   BILITOT 0.4 05/25/2017 1022      Component Value Date/Time   TSH 1.300 05/25/2017 1022    ASSESSMENT AND PLAN: Type 2 diabetes mellitus with hyperglycemia, without long-term current use of insulin (HCC)  Elevated blood pressure reading  At risk for heart disease  Class 2 severe obesity with serious comorbidity and body mass index (BMI) of 35.0 to 35.9 in adult, unspecified obesity type ([place])  PLAN:  Diabetes II Bridget Anderson has been given extensive diabetes education by myself today including ideal fasting and post-prandial blood glucose readings, individual ideal Hgb A1c goals and hypoglycemia prevention. We discussed the importance of good blood sugar control to decrease the likelihood of diabetic complications such as nephropathy, neuropathy, limb loss, blindness, coronary artery disease, and death. We discussed the importance of intensive lifestyle modification including diet, exercise and weight loss as the first line treatment for diabetes. Bridget Anderson agrees to continue her diabetes medications and she agrees to follow up with our clinic in 2 weeks.  Hypertensive urgency Bridget Anderson was advised to go to the emergency department for further management and treatment of elevated blood pressure. We will continue to monitor her blood pressure as well as her progress with her lifestyle modifications. She will continue her medications as prescribed and will watch for signs of hypotension as she continues her lifestyle modifications. Bridget Anderson agrees to follow up with our clinic in 2 weeks.  Cardiovascular risk counselling Bridget Anderson was given extended (15 minutes) coronary artery disease prevention counseling today. She is 47 y.o. female and  has risk factors for heart disease including obesity, diabetes II, and elevated blood pressure. We discussed intensive lifestyle modifications today with an emphasis on specific weight loss instructions and strategies. Pt was also informed of the importance of increasing exercise and decreasing saturated fats to help prevent heart disease.  Obesity Bridget Anderson is currently in the action stage of change. As such, her goal is to continue with weight loss efforts She has agreed to follow the  Category 3 plan Bridget Anderson has been instructed to work up to a goal of 150 minutes of combined cardio and strengthening exercise per week for weight loss and overall health benefits. We discussed the following Behavioral Modification Strategies today: increasing lean protein intake and work on meal planning and easy cooking plans   Bridget Anderson has agreed to follow up with our clinic in 2 weeks. She was informed of the importance of frequent follow up visits to maximize her success with intensive lifestyle modifications for her multiple health conditions.   OBESITY BEHAVIORAL INTERVENTION VISIT  Today's visit was # 3 out of 22.  Starting weight: 222 lbs Starting date: 05/25/17 Today's weight : 220 lbs  Today's date: 06/28/2017 Total lbs lost to date: 2 (Patients must lose 7 lbs in the first 6 months to continue with counseling)   ASK: We discussed the diagnosis of obesity with Bridget Anderson today and Bridget Anderson agreed to give [place] permission to discuss obesity behavioral modification therapy today.  ASSESS: Bridget Anderson has the diagnosis of obesity and her BMI today is 35.53 Bridget Anderson is in the action stage of change   ADVISE: Bridget Anderson was educated on the multiple health risks of obesity as well as the benefit of weight loss to improve her health. She was advised of the need for long term treatment and the importance of lifestyle modifications.  AGREE: Multiple dietary modification options and treatment options were discussed and  Bridget Anderson  agreed to the above obesity treatment plan.   Wilhemena Durie, am acting as transcriptionist for Lacy Duverney, PA-C I, Lacy Duverney [facility], have reviewed this note and agree with its content

## 2017-07-03 ENCOUNTER — Telehealth (INDEPENDENT_AMBULATORY_CARE_PROVIDER_SITE_OTHER): Payer: Self-pay | Admitting: Physician Assistant

## 2017-07-03 ENCOUNTER — Encounter (INDEPENDENT_AMBULATORY_CARE_PROVIDER_SITE_OTHER): Payer: Self-pay | Admitting: Physician Assistant

## 2017-07-03 NOTE — Telephone Encounter (Signed)
Ms Kidney returning [place] call.   It is Glumetza, 500mg  once a day is what she has found does not make her feel sick like metformin.  Please advise. Thank you Maudie Mercury

## 2017-07-03 NOTE — Telephone Encounter (Signed)
I advised  patient to call with name of medication to update her records. Is she requesting a refill ?

## 2017-07-05 NOTE — Telephone Encounter (Signed)
Left message for patient to call office IF she needs a refill on medication. Advised her she did not have to call if she had enough to last until her appointment.   Remi Deter

## 2017-07-09 ENCOUNTER — Other Ambulatory Visit (INDEPENDENT_AMBULATORY_CARE_PROVIDER_SITE_OTHER): Payer: Self-pay | Admitting: Family Medicine

## 2017-07-09 DIAGNOSIS — I10 Essential (primary) hypertension: Secondary | ICD-10-CM

## 2017-07-09 DIAGNOSIS — E7849 Other hyperlipidemia: Secondary | ICD-10-CM

## 2017-07-12 ENCOUNTER — Other Ambulatory Visit (INDEPENDENT_AMBULATORY_CARE_PROVIDER_SITE_OTHER): Payer: Self-pay | Admitting: Family Medicine

## 2017-07-12 DIAGNOSIS — E559 Vitamin D deficiency, unspecified: Secondary | ICD-10-CM

## 2017-07-19 ENCOUNTER — Ambulatory Visit (INDEPENDENT_AMBULATORY_CARE_PROVIDER_SITE_OTHER): Payer: 59 | Admitting: Physician Assistant

## 2017-07-20 ENCOUNTER — Ambulatory Visit (INDEPENDENT_AMBULATORY_CARE_PROVIDER_SITE_OTHER): Payer: 59 | Admitting: Physician Assistant

## 2017-07-20 VITALS — BP 161/93 | HR 72 | Temp 98.2°F | Ht 66.0 in | Wt 217.0 lb

## 2017-07-20 DIAGNOSIS — I10 Essential (primary) hypertension: Secondary | ICD-10-CM

## 2017-07-20 DIAGNOSIS — Z6835 Body mass index (BMI) 35.0-35.9, adult: Secondary | ICD-10-CM | POA: Diagnosis not present

## 2017-07-20 DIAGNOSIS — E119 Type 2 diabetes mellitus without complications: Secondary | ICD-10-CM | POA: Diagnosis not present

## 2017-07-20 DIAGNOSIS — Z9189 Other specified personal risk factors, not elsewhere classified: Secondary | ICD-10-CM

## 2017-07-20 MED ORDER — METFORMIN HCL 1000 MG PO TABS: 1000.0000 mg | ORAL_TABLET | Freq: Two times a day (BID) | ORAL | 0 refills | Status: DC

## 2017-07-20 NOTE — Progress Notes (Signed)
Office: [phone]  /  Fax: [phone]   HPI:   Chief Complaint: OBESITY Bridget Anderson is here to discuss her progress with her obesity treatment plan. She is on the Category 3 plan and is following her eating plan approximately 75 % of the time. She states she is exercising 0 minutes 0 times per week. Bridget Anderson continues to do well with weight loss. She makes better food choices and controls her portions.  Her weight is 217 lb (98.4 kg) today and has had a weight loss of 3 pounds over a period of 3 weeks since her last visit. She has lost 5 lbs since starting treatment with [place].  Diabetes II Bridget Anderson has a diagnosis of diabetes type II. Bridget Anderson states fasting BGs average in 170's and 180's, she denies any hypoglycemic episodes. Last A1c was 8.2 on 05/25/17. She has been working on intensive lifestyle modifications including diet, exercise, and weight loss to help control her blood glucose levels.  Hypertension Bridget Anderson is a 47 y.o. female with hypertension. Alexes's blood pressure is elevated. She is on carvedilol and [place], she states she is on 3rd blood pressure medication however, no records. She denies chest pain or shortness of breath. She is working weight loss to help control her blood pressure with the goal of decreasing her risk of heart attack and stroke. Bridget Anderson's blood pressure is not currently controlled.  At risk for cardiovascular disease Bridget Anderson is at a higher than average risk for cardiovascular disease due to obesity, diabetes II, and hypertension. She currently denies any chest pain.  ALLERGIES: Allergies  Allergen Reactions  . Shellfish Allergy Anaphylaxis    Allergic to some shellfish but all regular fish.  . Prednisone     Headache    MEDICATIONS: Current Outpatient Medications on File Prior to Visit  Medication Sig Dispense Refill  . atorvastatin (LIPITOR) 10 MG tablet Take 1 tablet (10 mg total) by mouth daily. 30 tablet 0  . Blood Glucose Monitoring Suppl (ONETOUCH  VERIO FLEX SYSTEM) w/Device KIT 1 kit by Does not apply route 2 (two) times daily. Please substitute for what is covered by patient insurance plan 1 kit 0  . carvedilol (COREG) 12.5 MG tablet Take 1.5 tablets (18.75 mg total) by mouth 2 (two) times daily with a meal. 60 tablet 0  . chlorthalidone (HYGROTON) 25 MG tablet Take 1 tablet (25 mg total) by mouth daily. 30 tablet 0  . glucose blood test strip Twice daily 100 each 0  . ONETOUCH DELICA LANCETS 75Y MISC 1 Package by Does not apply route 2 (two) times daily. 100 each 0  . rizatriptan (MAXALT) 10 MG tablet Take 1 tablet (10 mg total) by mouth as needed for migraine. May repeat in 2 hours if needed 10 tablet 6  . Vitamin D, Ergocalciferol, (DRISDOL) 50000 units CAPS capsule Take 1 capsule (50,000 Units total) by mouth every 7 (seven) days. 4 capsule 0  . zonisamide (ZONEGRAN) 50 MG capsule Take 2 capsules (100 mg total) by mouth daily. 60 capsule 8   No current facility-administered medications on file prior to visit.     PAST MEDICAL HISTORY: Past Medical History:  Diagnosis Date  . Chlamydia   . Diabetes ([place])   . GERD (gastroesophageal reflux disease)   . Hyperlipidemia   . Hypertension   . Migraine   . Obstructive sleep apnea on CPAP    pt has not worn CPAP in years  . Seasonal allergies   . Wears glasses  PAST SURGICAL HISTORY: Past Surgical History:  Procedure Laterality Date  . ABDOMINAL HYSTERECTOMY N/A 07/16/2013   Procedure: HYSTERECTOMY ABDOMINAL;  Surgeon: Allyn Kenner, DO;  Location: [facility];  Service: Gynecology;  Laterality: N/A;  . BILATERAL SALPINGECTOMY Bilateral 07/16/2013   Procedure: BILATERAL SALPINGECTOMY;  Surgeon: Allyn Kenner, DO;  Location: [facility];  Service: Gynecology;  Laterality: Bilateral;  . CARPAL TUNNEL RELEASE  01/16/2012   Procedure: CARPAL TUNNEL RELEASE;  Surgeon: Nita Sells, MD;  Location: [facility];  Service: Orthopedics;  Laterality: Left;  . CARPAL  TUNNEL RELEASE  02/20/2012   Procedure: CARPAL TUNNEL RELEASE;  Surgeon: Nita Sells, MD;  Location: [facility];  Service: Orthopedics;  Laterality: Right;  . COLONOSCOPY    . CYSTO WITH HYDRODISTENSION N/A 07/16/2013   Procedure: CYSTOSCOPY/HYDRODISTENSION;  Surgeon: Reece Packer, MD;  Location: [facility];  Service: Urology;  Laterality: N/A;  . DILATION AND CURETTAGE OF UTERUS    . FOOT SURGERY    . HYSTEROSCOPY W/D&C  10/31/2011   Procedure: DILATATION AND CURETTAGE /HYSTEROSCOPY;  Surgeon: Allyn Kenner, DO;  Location: [facility];  Service: Gynecology;  Laterality: N/A;  . LAPAROSCOPY N/A 07/16/2013   Procedure: LAPAROSCOPY DIAGNOSTIC;  Surgeon: Allyn Kenner, DO;  Location: [facility];  Service: Gynecology;  Laterality: N/A;  . PLANTAR FASCIA SURGERY    . PUBOVAGINAL SLING N/A 07/16/2013   Procedure: Gaynelle Arabian;  Surgeon: Reece Packer, MD;  Location: [facility];  Service: Urology;  Laterality: N/A;  . TUBAL LIGATION    . VAGINAL DELIVERY  1995,  2003    SOCIAL HISTORY: Social History   Tobacco Use  . Smoking status: Never Smoker  . Smokeless tobacco: Never Used  Substance Use Topics  . Alcohol use: Yes    Comment: Rarely  . Drug use: No    FAMILY HISTORY: Family History  Problem Relation Age of Onset  . Hypertension Mother   . Hyperlipidemia Mother   . Hypertension Father   . Hyperlipidemia Father   . Thyroid disease Father   . Hypertension Brother     ROS: Review of Systems  Constitutional: Positive for weight loss.  Respiratory: Negative for shortness of breath.   Cardiovascular: Negative for chest pain.  Endo/Heme/Allergies:       Negative hypoglycemia    PHYSICAL EXAM: Blood pressure (!) 161/93, pulse 72, temperature 98.2 F (36.8 C), temperature source Oral, height 5' 6"  (1.676 m), weight 217 lb (98.4 kg), last menstrual period 06/27/2013, SpO2 99 %. Body mass index is 35.02 kg/m. Physical Exam  Constitutional: She is  oriented to person, place, and time. She appears well-developed and well-nourished.  Cardiovascular: Normal rate.  Pulmonary/Chest: Effort normal.  Musculoskeletal: Normal range of motion.  Neurological: She is oriented to person, place, and time.  Skin: Skin is warm and dry.  Psychiatric: She has a normal mood and affect. Her behavior is normal.  Vitals reviewed.   RECENT LABS AND TESTS: BMET    Component Value Date/Time   NA 135 05/25/2017 1022   K 4.1 05/25/2017 1022   CL 98 05/25/2017 1022   CO2 25 05/25/2017 1022   GLUCOSE 156 (H) 05/25/2017 1022   GLUCOSE 138 (H) 06/16/2016 2029   BUN 10 05/25/2017 1022   CREATININE 0.74 05/25/2017 1022   CALCIUM 9.0 05/25/2017 1022   GFRNONAA 97 05/25/2017 1022   GFRAA 112 05/25/2017 1022   Lab Results  Component Value Date   HGBA1C 8.2 (H) 05/25/2017  Lab Results  Component Value Date   INSULIN 10.6 05/25/2017   CBC    Component Value Date/Time   WBC 9.5 05/25/2017 1022   WBC 12.0 (H) 06/16/2016 2029   RBC 4.90 05/25/2017 1022   RBC 5.01 06/16/2016 2029   HGB 12.6 05/25/2017 1022   HCT 39.7 05/25/2017 1022   PLT 384 06/16/2016 2029   MCV 81 05/25/2017 1022   MCH 25.7 (L) 05/25/2017 1022   MCH 25.7 (L) 06/16/2016 2029   MCHC 31.7 05/25/2017 1022   MCHC 32.0 06/16/2016 2029   RDW 14.2 05/25/2017 1022   LYMPHSABS 3.7 (H) 05/25/2017 1022   MONOABS 0.4 09/02/2008 2227   EOSABS 0.2 05/25/2017 1022   BASOSABS 0.0 05/25/2017 1022   Iron/TIBC/Ferritin/ %Sat No results found for: IRON, TIBC, FERRITIN, IRONPCTSAT Lipid Panel     Component Value Date/Time   CHOL 242 (H) 05/25/2017 1022   TRIG 101 05/25/2017 1022   HDL 44 05/25/2017 1022   LDLCALC 178 (H) 05/25/2017 1022   Hepatic Function Panel     Component Value Date/Time   PROT 7.6 05/25/2017 1022   ALBUMIN 4.0 05/25/2017 1022   AST 13 05/25/2017 1022   ALT 13 05/25/2017 1022   ALKPHOS 90 05/25/2017 1022   BILITOT 0.4 05/25/2017 1022      Component Value  Date/Time   TSH 1.300 05/25/2017 1022    ASSESSMENT AND PLAN: Type 2 diabetes mellitus without complication, without long-term current use of insulin (HCC)  Essential hypertension  At risk for heart disease  Class 2 severe obesity with serious comorbidity and body mass index (BMI) of 35.0 to 35.9 in adult, unspecified obesity type ([place])  PLAN:  Diabetes II Alivea has been given extensive diabetes education by myself today including ideal fasting and post-prandial blood glucose readings, individual ideal Hgb A1c goals and hypoglycemia prevention. We discussed the importance of good blood sugar control to decrease the likelihood of diabetic complications such as nephropathy, neuropathy, limb loss, blindness, coronary artery disease, and death. We discussed the importance of intensive lifestyle modification including diet, exercise and weight loss as the first line treatment for diabetes. Bridget Anderson agrees to start metformin 1,000 mg BID #60 with no refills. Bridget Anderson agrees to follow up with our clinic in 2 weeks.  Hypertension We discussed sodium restriction, working on healthy weight loss, and a regular exercise program as the means to achieve improved blood pressure control. Bridget Anderson agreed with this plan and agreed to follow up as directed. We will continue to monitor her blood pressure as well as her progress with the above lifestyle modifications. She will continue her medications as prescribed and will watch for signs of hypotension as she continues her lifestyle modifications. Bridget Anderson agrees to follow up with our clinic in 2 weeks.  Cardiovascular risk counselling Bridget Anderson was given extended (15 minutes) coronary artery disease prevention counseling today. She is 47 y.o. female and has risk factors for heart disease including obesity, diabetes II, and hypertension. We discussed intensive lifestyle modifications today with an emphasis on specific weight loss instructions and strategies. Pt was also informed  of the importance of increasing exercise and decreasing saturated fats to help prevent heart disease.  Obesity Bridget Anderson is currently in the action stage of change. As such, her goal is to continue with weight loss efforts She has agreed to follow the Category 3 plan Bridget Anderson has been instructed to work up to a goal of 150 minutes of combined cardio and strengthening exercise per week for weight  loss and overall health benefits. We discussed the following Behavioral Modification Strategies today: increasing lean protein intake and work on meal planning and easy cooking plans   Bridget Anderson has agreed to follow up with our clinic in 2 weeks. She was informed of the importance of frequent follow up visits to maximize her success with intensive lifestyle modifications for her multiple health conditions.   OBESITY BEHAVIORAL INTERVENTION VISIT  Today's visit was # 4 out of 22.  Starting weight: 222 lbs Starting date: 05/25/17 Today's weight : 217 lbs  Today's date: 07/20/2017 Total lbs lost to date: 5 (Patients must lose 7 lbs in the first 6 months to continue with counseling)   ASK: We discussed the diagnosis of obesity with Bridget Anderson today and Bridget Anderson agreed to give [place] permission to discuss obesity behavioral modification therapy today.  ASSESS: Bridget Anderson has the diagnosis of obesity and her BMI today is 35.04 Marleta is in the action stage of change   ADVISE: Bridget Anderson was educated on the multiple health risks of obesity as well as the benefit of weight loss to improve her health. She was advised of the need for long term treatment and the importance of lifestyle modifications.  AGREE: Multiple dietary modification options and treatment options were discussed and  Bridget Anderson agreed to the above obesity treatment plan.   Wilhemena Durie, am acting as transcriptionist for Lacy Duverney, PA-C I, Lacy Duverney [facility], have reviewed this note and agree with its content

## 2017-07-24 ENCOUNTER — Other Ambulatory Visit (INDEPENDENT_AMBULATORY_CARE_PROVIDER_SITE_OTHER): Payer: Self-pay | Admitting: Family Medicine

## 2017-07-24 DIAGNOSIS — E119 Type 2 diabetes mellitus without complications: Secondary | ICD-10-CM

## 2017-08-04 DIAGNOSIS — Y929 Unspecified place or not applicable: Secondary | ICD-10-CM | POA: Insufficient documentation

## 2017-08-04 DIAGNOSIS — Y939 Activity, unspecified: Secondary | ICD-10-CM | POA: Diagnosis not present

## 2017-08-04 DIAGNOSIS — X16XXXA Contact with hot heating appliances, radiators and pipes, initial encounter: Secondary | ICD-10-CM | POA: Insufficient documentation

## 2017-08-04 DIAGNOSIS — S0003XA Contusion of scalp, initial encounter: Secondary | ICD-10-CM | POA: Insufficient documentation

## 2017-08-04 DIAGNOSIS — E119 Type 2 diabetes mellitus without complications: Secondary | ICD-10-CM | POA: Insufficient documentation

## 2017-08-04 DIAGNOSIS — Z79899 Other long term (current) drug therapy: Secondary | ICD-10-CM | POA: Diagnosis not present

## 2017-08-04 DIAGNOSIS — Y999 Unspecified external cause status: Secondary | ICD-10-CM | POA: Diagnosis not present

## 2017-08-04 DIAGNOSIS — I10 Essential (primary) hypertension: Secondary | ICD-10-CM | POA: Insufficient documentation

## 2017-08-04 DIAGNOSIS — S0990XA Unspecified injury of head, initial encounter: Secondary | ICD-10-CM | POA: Diagnosis present

## 2017-08-05 ENCOUNTER — Emergency Department (HOSPITAL_COMMUNITY): Payer: No Typology Code available for payment source

## 2017-08-05 ENCOUNTER — Other Ambulatory Visit: Payer: Self-pay

## 2017-08-05 ENCOUNTER — Emergency Department (HOSPITAL_COMMUNITY)
Admission: EM | Admit: 2017-08-05 | Discharge: 2017-08-05 | Disposition: A | Discharge status: Home / Self Care | Payer: No Typology Code available for payment source | Attending: Emergency Medicine | Admitting: Emergency Medicine

## 2017-08-05 ENCOUNTER — Encounter (HOSPITAL_COMMUNITY): Payer: Self-pay | Admitting: Emergency Medicine

## 2017-08-05 DIAGNOSIS — S0003XA Contusion of scalp, initial encounter: Secondary | ICD-10-CM

## 2017-08-05 DIAGNOSIS — I10 Essential (primary) hypertension: Secondary | ICD-10-CM

## 2017-08-05 MED ORDER — IBUPROFEN 400 MG PO TABS
400.0000 mg | ORAL_TABLET | Freq: Once | ORAL | Status: AC | PRN
  Administered 2017-08-05: 400 mg via ORAL
  Filled 2017-08-05: qty 1

## 2017-08-05 MED ORDER — ONDANSETRON 4 MG PO TBDP
8.0000 mg | ORAL_TABLET | Freq: Once | ORAL | Status: AC
  Administered 2017-08-05: 8 mg via ORAL
  Filled 2017-08-05: qty 2

## 2017-08-05 NOTE — Discharge Instructions (Addendum)
Apply ice several times a day.  Take acetaminophen or ibuprofen as needed for pain.
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
## 2017-08-05 NOTE — ED Notes (Signed)
RN Santiago Glad informed of pt BP

## 2017-08-07 ENCOUNTER — Emergency Department (HOSPITAL_COMMUNITY): Payer: No Typology Code available for payment source

## 2017-08-07 ENCOUNTER — Encounter (HOSPITAL_COMMUNITY): Payer: Self-pay | Admitting: *Deleted

## 2017-08-07 ENCOUNTER — Emergency Department (HOSPITAL_COMMUNITY)
Admission: EM | Admit: 2017-08-07 | Discharge: 2017-08-07 | Disposition: A | Discharge status: Home / Self Care | Payer: No Typology Code available for payment source | Attending: Emergency Medicine | Admitting: Emergency Medicine

## 2017-08-07 ENCOUNTER — Other Ambulatory Visit: Payer: Self-pay

## 2017-08-07 DIAGNOSIS — M542 Cervicalgia: Secondary | ICD-10-CM | POA: Diagnosis not present

## 2017-08-07 DIAGNOSIS — Y9389 Activity, other specified: Secondary | ICD-10-CM | POA: Diagnosis not present

## 2017-08-07 DIAGNOSIS — S060X0D Concussion without loss of consciousness, subsequent encounter: Secondary | ICD-10-CM

## 2017-08-07 DIAGNOSIS — Y990 Civilian activity done for income or pay: Secondary | ICD-10-CM | POA: Diagnosis not present

## 2017-08-07 DIAGNOSIS — W208XXA Other cause of strike by thrown, projected or falling object, initial encounter: Secondary | ICD-10-CM | POA: Diagnosis not present

## 2017-08-07 DIAGNOSIS — Y929 Unspecified place or not applicable: Secondary | ICD-10-CM | POA: Diagnosis not present

## 2017-08-07 DIAGNOSIS — S060X0A Concussion without loss of consciousness, initial encounter: Secondary | ICD-10-CM | POA: Diagnosis not present

## 2017-08-07 DIAGNOSIS — Z79899 Other long term (current) drug therapy: Secondary | ICD-10-CM | POA: Insufficient documentation

## 2017-08-07 DIAGNOSIS — E119 Type 2 diabetes mellitus without complications: Secondary | ICD-10-CM | POA: Diagnosis not present

## 2017-08-07 DIAGNOSIS — I10 Essential (primary) hypertension: Secondary | ICD-10-CM | POA: Diagnosis not present

## 2017-08-07 DIAGNOSIS — S0990XA Unspecified injury of head, initial encounter: Secondary | ICD-10-CM | POA: Diagnosis present

## 2017-08-07 LAB — I-STAT CHEM 8, ED
BUN: 15 mg/dL (ref 6–20)
CHLORIDE: 103 mmol/L (ref 101–111)
Calcium, Ion: 1.16 mmol/L (ref 1.15–1.40)
Creatinine, Ser: 0.8 mg/dL (ref 0.44–1.00)
Glucose, Bld: 139 mg/dL — ABNORMAL HIGH (ref 65–99)
HEMATOCRIT: 43 % (ref 36.0–46.0)
Hemoglobin: 14.6 g/dL (ref 12.0–15.0)
POTASSIUM: 3.7 mmol/L (ref 3.5–5.1)
Sodium: 140 mmol/L (ref 135–145)
TCO2: 26 mmol/L (ref 22–32)

## 2017-08-07 MED ORDER — IOPAMIDOL (ISOVUE-370) INJECTION 76%
INTRAVENOUS | Status: AC
  Administered 2017-08-07: 50 mL
  Filled 2017-08-07: qty 50

## 2017-08-07 MED ORDER — SODIUM CHLORIDE 0.9 % IV BOLUS
500.0000 mL | Freq: Once | INTRAVENOUS | Status: AC
  Administered 2017-08-07: 500 mL via INTRAVENOUS

## 2017-08-07 MED ORDER — METOCLOPRAMIDE HCL 5 MG/ML IJ SOLN
10.0000 mg | Freq: Once | INTRAMUSCULAR | Status: AC
  Administered 2017-08-07: 10 mg via INTRAVENOUS
  Filled 2017-08-07: qty 2

## 2017-08-07 NOTE — ED Notes (Signed)
Patient transported to CT 

## 2017-08-07 NOTE — Discharge Instructions (Signed)
You had a CT scan of your neck in the Emergency Department today.  It showed that you have some enlarged lymph nodes in your neck as well as a slightly enlarged thyroid gland.  Please follow up with your Family Doctor for further evaluation.
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
## 2017-08-07 NOTE — ED Notes (Signed)
ED Provider at bedside. 

## 2017-08-08 ENCOUNTER — Encounter (INDEPENDENT_AMBULATORY_CARE_PROVIDER_SITE_OTHER): Payer: Self-pay

## 2017-08-08 ENCOUNTER — Ambulatory Visit (INDEPENDENT_AMBULATORY_CARE_PROVIDER_SITE_OTHER): Payer: 59 | Admitting: Physician Assistant

## 2017-08-09 ENCOUNTER — Telehealth: Payer: Self-pay

## 2017-08-09 NOTE — Telephone Encounter (Signed)
We have attempted to call the patient two times to schedule sleep study.  Patient has been unavailable at the phone numbers we have on file and has not returned our calls.  At this point we will send a letter asking patient to please contact the sleep lab to schedule their sleep study.  If patient calls back we will schedule them for their sleep study. 

## 2017-11-20 ENCOUNTER — Encounter: Payer: Self-pay | Admitting: Physical Medicine & Rehabilitation

## 2017-11-20 ENCOUNTER — Encounter
Payer: No Typology Code available for payment source | Attending: Physical Medicine & Rehabilitation | Admitting: Physical Medicine & Rehabilitation

## 2017-11-20 ENCOUNTER — Telehealth: Payer: Self-pay

## 2017-11-20 ENCOUNTER — Other Ambulatory Visit: Payer: Self-pay

## 2017-11-20 VITALS — BP 176/118 | HR 79 | Ht 66.0 in | Wt 222.4 lb

## 2017-11-20 DIAGNOSIS — I10 Essential (primary) hypertension: Secondary | ICD-10-CM | POA: Insufficient documentation

## 2017-11-20 DIAGNOSIS — G4701 Insomnia due to medical condition: Secondary | ICD-10-CM | POA: Diagnosis not present

## 2017-11-20 DIAGNOSIS — G4700 Insomnia, unspecified: Secondary | ICD-10-CM | POA: Insufficient documentation

## 2017-11-20 DIAGNOSIS — E785 Hyperlipidemia, unspecified: Secondary | ICD-10-CM | POA: Insufficient documentation

## 2017-11-20 DIAGNOSIS — R4689 Other symptoms and signs involving appearance and behavior: Secondary | ICD-10-CM

## 2017-11-20 DIAGNOSIS — S069X0S Unspecified intracranial injury without loss of consciousness, sequela: Secondary | ICD-10-CM

## 2017-11-20 DIAGNOSIS — G4733 Obstructive sleep apnea (adult) (pediatric): Secondary | ICD-10-CM | POA: Diagnosis not present

## 2017-11-20 DIAGNOSIS — G44329 Chronic post-traumatic headache, not intractable: Secondary | ICD-10-CM

## 2017-11-20 DIAGNOSIS — F0781 Postconcussional syndrome: Secondary | ICD-10-CM | POA: Diagnosis present

## 2017-11-20 DIAGNOSIS — Z8349 Family history of other endocrine, nutritional and metabolic diseases: Secondary | ICD-10-CM | POA: Insufficient documentation

## 2017-11-20 DIAGNOSIS — Z90710 Acquired absence of both cervix and uterus: Secondary | ICD-10-CM | POA: Insufficient documentation

## 2017-11-20 DIAGNOSIS — G43909 Migraine, unspecified, not intractable, without status migrainosus: Secondary | ICD-10-CM | POA: Diagnosis not present

## 2017-11-20 DIAGNOSIS — H9319 Tinnitus, unspecified ear: Secondary | ICD-10-CM | POA: Insufficient documentation

## 2017-11-20 DIAGNOSIS — Z8249 Family history of ischemic heart disease and other diseases of the circulatory system: Secondary | ICD-10-CM | POA: Diagnosis not present

## 2017-11-20 DIAGNOSIS — K219 Gastro-esophageal reflux disease without esophagitis: Secondary | ICD-10-CM | POA: Diagnosis not present

## 2017-11-20 DIAGNOSIS — E119 Type 2 diabetes mellitus without complications: Secondary | ICD-10-CM | POA: Insufficient documentation

## 2017-11-20 DIAGNOSIS — R413 Other amnesia: Secondary | ICD-10-CM | POA: Insufficient documentation

## 2017-11-20 MED ORDER — NORTRIPTYLINE HCL 10 MG PO CAPS: 10.0000 mg | ORAL_CAPSULE | Freq: Every day | ORAL | 2 refills | Status: DC

## 2017-11-20 NOTE — Telephone Encounter (Signed)
No UDS done, pt not on any controlled medication

## 2017-11-20 NOTE — Patient Instructions (Signed)
PLEASE FEEL FREE TO CALL OUR OFFICE WITH ANY PROBLEMS OR QUESTIONS ([phone])     TRY ONE NORTRIPTYLINE 10MG  AT NIGHT FOR 5-7 DAYS THEN INCREASE TO 20MG 
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
## 2017-12-05 ENCOUNTER — Other Ambulatory Visit: Payer: Self-pay

## 2017-12-05 ENCOUNTER — Ambulatory Visit: Payer: No Typology Code available for payment source | Attending: Family Medicine | Admitting: Physical Therapy

## 2017-12-05 VITALS — BP 202/122 | HR 82

## 2017-12-05 DIAGNOSIS — M542 Cervicalgia: Secondary | ICD-10-CM | POA: Diagnosis present

## 2017-12-05 DIAGNOSIS — R2681 Unsteadiness on feet: Secondary | ICD-10-CM | POA: Diagnosis present

## 2017-12-05 DIAGNOSIS — R42 Dizziness and giddiness: Secondary | ICD-10-CM | POA: Diagnosis present

## 2017-12-05 DIAGNOSIS — R2689 Other abnormalities of gait and mobility: Secondary | ICD-10-CM | POA: Diagnosis present

## 2017-12-05 NOTE — Therapy (Signed)
[facility] [address] [place], [place], [address] Phone: [phone]   Fax:  [phone]  Physical Therapy Evaluation  Patient Details  Name: Bridget Anderson MRN: [id number] Date of Birth: 03/25/1970 Referring Provider (PT): Dr. Oval Linsey   Encounter Date: 12/05/2017  PT End of Session - 12/05/17 1638    Visit Number  1    Number of Visits  13    Date for PT Re-Evaluation  01/16/18    Authorization Type  Workers Comp    PT Start Time  1534    PT Stop Time  1635    PT Time Calculation (min)  61 min    Activity Tolerance  Treatment limited secondary to medical complications (Comment)   high BP during exam   Behavior During Therapy  Agitated;Anxious       Past Medical History:  Diagnosis Date  . Chlamydia   . Diabetes ([place])   . GERD (gastroesophageal reflux disease)   . Hyperlipidemia   . Hypertension   . Migraine   . Obstructive sleep apnea on CPAP    pt has not worn CPAP in years  . Seasonal allergies   . Wears glasses     Past Surgical History:  Procedure Laterality Date  . ABDOMINAL HYSTERECTOMY N/A 07/16/2013   Procedure: HYSTERECTOMY ABDOMINAL;  Surgeon: Allyn Kenner, DO;  Location: [facility];  Service: Gynecology;  Laterality: N/A;  . BILATERAL SALPINGECTOMY Bilateral 07/16/2013   Procedure: BILATERAL SALPINGECTOMY;  Surgeon: Allyn Kenner, DO;  Location: [facility];  Service: Gynecology;  Laterality: Bilateral;  . CARPAL TUNNEL RELEASE  01/16/2012   Procedure: CARPAL TUNNEL RELEASE;  Surgeon: Nita Sells, MD;  Location: [facility];  Service: Orthopedics;  Laterality: Left;  . CARPAL TUNNEL RELEASE  02/20/2012   Procedure: CARPAL TUNNEL RELEASE;  Surgeon: Nita Sells, MD;  Location: [facility];  Service: Orthopedics;  Laterality: Right;  . COLONOSCOPY    . CYSTO WITH HYDRODISTENSION N/A 07/16/2013   Procedure: CYSTOSCOPY/HYDRODISTENSION;  Surgeon:  Reece Packer, MD;  Location: [facility];  Service: Urology;  Laterality: N/A;  . DILATION AND CURETTAGE OF UTERUS    . FOOT SURGERY    . HYSTEROSCOPY W/D&C  10/31/2011   Procedure: DILATATION AND CURETTAGE /HYSTEROSCOPY;  Surgeon: Allyn Kenner, DO;  Location: [facility];  Service: Gynecology;  Laterality: N/A;  . LAPAROSCOPY N/A 07/16/2013   Procedure: LAPAROSCOPY DIAGNOSTIC;  Surgeon: Allyn Kenner, DO;  Location: [facility];  Service: Gynecology;  Laterality: N/A;  . PLANTAR FASCIA SURGERY    . PUBOVAGINAL SLING N/A 07/16/2013   Procedure: Gaynelle Arabian;  Surgeon: Reece Packer, MD;  Location: [facility];  Service: Urology;  Laterality: N/A;  . TUBAL LIGATION    . [place],  2003    Vitals:   12/05/17 1535  BP: (!) 202/122  Pulse: 82     Subjective Assessment - 12/05/17 1535    Subjective  has a history of migraines years before injury. Had an 85# pipe fall and strike head on 08/04/17. When it first happened reports inability to see/hear. Has not worked since injury. Reports a constant headache at crown. reports dizziness happens anytime - feels "odd" like she has to sit down or hold onto something. Feels like she loses focus. Does report some blurriness of vision. wears glasses all the time (has instances of blurriness). Nothing seems to help headache pain. Youngest daughter lives with patient. Has difficulty managing medication.  Reports difficulty sleeping - sleeps ~few hours then wakes and can not go back to sleep.     Patient Stated Goals  improve pain and dizziness    Currently in Pain?  Yes    Pain Score  5     Pain Location  Head    Pain Orientation  Anterior    Pain Descriptors / Indicators  Aching;Discomfort;Throbbing    Pain Type  Chronic pain         [facility] PT Assessment - 12/05/17 1551      Assessment   Medical Diagnosis  Post-consussion syndrome    Referring Provider (PT)  Dr. Oval Linsey    Onset Date/Surgical Date  08/04/17    Next MD Visit  --    unknown   Prior Therapy  no      Precautions   Precautions  None      Restrictions   Weight Bearing Restrictions  No      Balance Screen   Has the patient fallen in the past 6 months  Yes    How many times?  1    Has the patient had a decrease in activity level because of a fear of falling?   No    Is the patient reluctant to leave their home because of a fear of falling?   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 Often over/undershoots    Heel Shin Test  normal      Posture/Postural Control   Posture/Postural Control  Postural limitations    Postural Limitations  Rounded Shoulders;Forward head      ROM / Strength   AROM / PROM / Strength  Strength      Strength   Overall Strength Comments  B LE grossly 4/5      Balance   Balance Assessed  Yes      Standardized Balance Assessment   Standardized Balance Assessment  Dynamic Gait Index      Dynamic Gait Index   Level Surface  Mild Impairment     Change in Gait Speed  Mild Impairment    Gait with Horizontal Head Turns  Mild Impairment  Gait with Vertical Head Turns  Mild Impairment    Gait and Pivot Turn  Moderate Impairment    Step Over Obstacle  Moderate Impairment    Step Around Obstacles  Mild Impairment    Steps  Mild Impairment    Total Score  14    DGI comment:  14/24 - high fall risk           Vestibular Assessment - 12/05/17 0001      Vestibular Assessment   General Observation  wears glasses daily      Symptom Behavior   Type of Dizziness  Spinning    Frequency of Dizziness  --   everyday - difficult with recall   Aggravating Factors  Activity in general;Sit to stand      Occulomotor Exam   Occulomotor Alignment  Normal    Spontaneous  Absent    Smooth Pursuits  Intact   reports of feeling "off"   Saccades  Intact   reports of feeling "off"         Objective measurements completed on examination: See above findings.              PT Education - 12/05/17 1636    Education Details  strongly advised by PT and PCP that patient present to ED due to high BP readings in clinic without stressors or vigorous activity. Family member (mother and sister made aware)    Person(s) Educated  Patient;Parent(s);Other (comment)    Methods  Explanation    Comprehension  Need further instruction       PT Short Term Goals - 12/05/17 1710      PT SHORT TERM GOAL #1   Title  Patient to be independent with HEP for vestibualr rehab and headache symptoms    Time  3    Period  Weeks    Status  New    Target Date  12/26/17      PT SHORT TERM GOAL #2   Title  patient to improve DGI by >/= 3 points     Time  3    Period  Weeks    Status  New    Target Date  12/26/17      PT SHORT TERM GOAL #3   Title  PT to assess BERG and write goal as indicated    Time  3    Period  Weeks    Status  New    Target Date  12/26/17      PT SHORT TERM GOAL #4   Title  PT to formally assess vestibular function as  indicated    Time  3    Period  Weeks    Status  New    Target Date  12/26/17        PT Long Term Goals - 12/05/17 1712      PT LONG TERM GOAL #1   Title  patient to be independent with advanced HEP    Time  6    Period  Weeks    Status  New    Target Date  01/16/18      PT LONG TERM GOAL #2   Title  patient to report reduction in headache frequency, intensity, duration by >/= 50%    Time  6    Period  Weeks    Status  New    Target Date  01/16/18      PT LONG TERM GOAL #3   Title  patient to  improve DGI by >/= 6 points demonstrating reduced fall risk    Time  6    Period  Weeks    Status  New    Target Date  01/16/18      PT LONG TERM GOAL #4   Title  patient to demonstrate appropriate posture and body mechanics as it realtes to her daily activities.     Time  6    Period  Weeks    Status  New    Target Date  01/16/18             Plan - 12/05/17 1638    Clinical Impression Statement  Ms. Rossano presenting today to OPPT s/p concussion on 08/04/17 following 85# pipe striking head. Patient today with reports of constant headache, dizziness, memory loss, and feelings of imbalance. Patient reporitng need for family assistance for IADLs including driving and medication management, but does report over medicating self due to forgetfulness even with use of pill sorter. Able to assess DGI in clinic with patient reporting difficulty performing due to increased headache symptoms and dizziness. BP measures in R and L UE with normal and large cuff with readings of 205/130, 207/131, 202/122 and a manual reading of 202/121. PT requesting to share information with family/caregivers/PCP with patient verbally agreeing to this. Patient calling sister with patient allowing PT to speak to sister with PT strongly advising patient report to the ED. PT also calling PCP office with nurse/MD also recommending patient present to ED. Patient seemingly agitated following this recommendation, with PT  speaking to patients mother at conclusion of exam and alerting her to BP readings and ED recommendations as patient does not drive herself. Patient left clinic via private car with mother driving, with mother verbally understanding PT/PCP recommendations. Will plan to follow up with patient following ED/MD visits regarding BP. Patient to benefit from skilled PT to address headache symptoms and dizziness.     History and Personal Factors relevant to plan of care:  HTN, DM, migraine, concussion, memory loss, need for family assistance for IADLs    Clinical Presentation  Evolving    Clinical Presentation due to:  HTN, DM, migraine, concussion, memory loss, need for family assistance for IADLs    Clinical Decision Making  Moderate    Rehab Potential  Good    PT Frequency  3x / week    PT Duration  6 weeks    PT Treatment/Interventions  ADLs/Self Care Home Management;Moist Heat;Gait training;Stair training;Functional mobility training;Therapeutic activities;Therapeutic exercise;Balance training;Patient/family education;Neuromuscular re-education;Manual techniques;Vestibular;Taping;Passive range of motion;Visual/perceptual remediation/compensation;Canalith Repostioning;Cryotherapy;Building control surveyor and Agree with Plan of Care  Patient       Patient will benefit from skilled therapeutic intervention in order to improve the following deficits and impairments:  Pain, Dizziness, Decreased activity tolerance, Decreased balance, Decreased safety awareness, Decreased coordination  Visit Diagnosis: Dizziness and giddiness  Cervicalgia  Other abnormalities of gait and mobility  Unsteadiness on feet     Problem List Patient Active Problem List   Diagnosis Date Noted  . Chronic post-traumatic headache, not intractable 11/20/2017  . Post concussion syndrome 11/20/2017  . Shortness of breath on exertion 05/25/2017  . Other fatigue 05/25/2017  . Type 2 diabetes mellitus without  complication, without long-term current use of insulin ([place]) 05/25/2017  . Essential hypertension 05/25/2017  . Chronic migraine without aura, with intractable migraine, so stated, with status migrainosus 03/20/2017  . S/P hysterectomy 07/16/2013    Lanney Gins, PT, DPT Supplemental  Physical Therapist 12/05/17 5:18 PM Pager: [phone] Office: [facility] [address] [place], [place], [address] Phone: [phone]   Fax:  [phone]  Name: Bridget Anderson MRN: [id number] Date of Birth: 04-30-1970

## 2017-12-08 ENCOUNTER — Ambulatory Visit: Payer: Self-pay

## 2017-12-10 ENCOUNTER — Other Ambulatory Visit (INDEPENDENT_AMBULATORY_CARE_PROVIDER_SITE_OTHER): Payer: Self-pay | Admitting: Family Medicine

## 2017-12-10 DIAGNOSIS — I10 Essential (primary) hypertension: Secondary | ICD-10-CM

## 2017-12-18 ENCOUNTER — Encounter: Payer: Self-pay | Admitting: Physical Medicine & Rehabilitation

## 2017-12-18 ENCOUNTER — Encounter
Payer: No Typology Code available for payment source | Attending: Physical Medicine & Rehabilitation | Admitting: Physical Medicine & Rehabilitation

## 2017-12-18 VITALS — BP 195/116 | HR 83 | Resp 14 | Ht 66.0 in | Wt 219.0 lb

## 2017-12-18 DIAGNOSIS — Z90710 Acquired absence of both cervix and uterus: Secondary | ICD-10-CM | POA: Insufficient documentation

## 2017-12-18 DIAGNOSIS — Z79899 Other long term (current) drug therapy: Secondary | ICD-10-CM | POA: Diagnosis not present

## 2017-12-18 DIAGNOSIS — E785 Hyperlipidemia, unspecified: Secondary | ICD-10-CM | POA: Insufficient documentation

## 2017-12-18 DIAGNOSIS — G43909 Migraine, unspecified, not intractable, without status migrainosus: Secondary | ICD-10-CM | POA: Insufficient documentation

## 2017-12-18 DIAGNOSIS — Z8349 Family history of other endocrine, nutritional and metabolic diseases: Secondary | ICD-10-CM | POA: Insufficient documentation

## 2017-12-18 DIAGNOSIS — E119 Type 2 diabetes mellitus without complications: Secondary | ICD-10-CM | POA: Diagnosis not present

## 2017-12-18 DIAGNOSIS — G4733 Obstructive sleep apnea (adult) (pediatric): Secondary | ICD-10-CM | POA: Diagnosis not present

## 2017-12-18 DIAGNOSIS — F0781 Postconcussional syndrome: Secondary | ICD-10-CM

## 2017-12-18 DIAGNOSIS — G44329 Chronic post-traumatic headache, not intractable: Secondary | ICD-10-CM | POA: Diagnosis not present

## 2017-12-18 DIAGNOSIS — Z8249 Family history of ischemic heart disease and other diseases of the circulatory system: Secondary | ICD-10-CM | POA: Diagnosis not present

## 2017-12-18 DIAGNOSIS — K219 Gastro-esophageal reflux disease without esophagitis: Secondary | ICD-10-CM | POA: Insufficient documentation

## 2017-12-18 DIAGNOSIS — I10 Essential (primary) hypertension: Secondary | ICD-10-CM

## 2017-12-18 DIAGNOSIS — G4700 Insomnia, unspecified: Secondary | ICD-10-CM | POA: Diagnosis not present

## 2017-12-18 DIAGNOSIS — H9319 Tinnitus, unspecified ear: Secondary | ICD-10-CM | POA: Diagnosis not present

## 2017-12-18 DIAGNOSIS — R413 Other amnesia: Secondary | ICD-10-CM | POA: Diagnosis not present

## 2017-12-18 MED ORDER — NORTRIPTYLINE HCL 25 MG PO CAPS: 25.0000 mg | ORAL_CAPSULE | Freq: Every day | ORAL | 2 refills | Status: DC

## 2017-12-18 MED ORDER — LORAZEPAM 1 MG PO TABS: 1.0000 mg | ORAL_TABLET | Freq: Once | ORAL | 0 refills | Status: AC

## 2017-12-18 MED ORDER — CARVEDILOL 25 MG PO TABS
25.0000 mg | ORAL_TABLET | Freq: Two times a day (BID) | ORAL | 3 refills | Status: DC
Start: 2017-12-18 — End: 2018-01-05

## 2017-12-18 NOTE — Patient Instructions (Signed)
PLEASE FEEL FREE TO CALL OUR OFFICE WITH ANY PROBLEMS OR QUESTIONS ([phone])      

## 2017-12-18 NOTE — Progress Notes (Signed)
Subjective:    Patient ID: Bridget Anderson, female    DOB: 02-Oct-1970, 47 y.o.   MRN: [id number]  HPI   He remains on hydrocodone 1 every 8 hours this is a follow-up office visit for Mrs. Bridget Anderson who presents today regarding her postconcussion syndrome.  I last saw her on September 30.  At that visit initiated nortriptyline 10 to 20 mg at nighttime and it made a referral to physical and speech therapy at [facility] neuro rehab.  Also recommended neuropsychological follow-up.  She attended therapy on October 15 and at the time of her visit her blood pressure apparently was quite elevated (202/122).  The session was ended and the patient abruptly left the appointment.  Other than that, not much is changed since I last saw her.  Her MRI scheduled for this afternoon.  Sister is asking for some sedation to help get her through the test.  Cognition and behavior is similar to that of last visit.  Went back to look at other blood pressures throughout her recent hospital care and they have been consistently elevated in the range noted above.  Other than the PT, and also made a referral to speech therapy.  They are still waiting an appointment with speech and apparently there were some his communications on the scheduling of such.    Pain Inventory Average Pain 5 Pain Right Now 5 My pain is sharp  In the last 24 hours, has pain interfered with the following? General activity 0 Relation with others 5 Enjoyment of life 5 What TIME of day is your pain at its worst? all Sleep (in general) Poor  Pain is worse with: walking, bending, sitting, inactivity, standing and some activites Pain improves with: medication Relief from Meds: 2  Mobility walk without assistance  Function employed # of hrs/week workers comp  Neuro/Psych dizziness confusion depression anxiety  Prior Studies Any changes since last visit?  no  Physicians involved in your care Any changes since last visit?   no   Family History  Problem Relation Age of Onset  . Hypertension Mother   . Hyperlipidemia Mother   . Hypertension Father   . Hyperlipidemia Father   . Thyroid disease Father   . Hypertension Brother    Social History   Socioeconomic History  . Marital status: Single    Spouse name: Not on file  . Number of children: 2  . Years of education: Not on file  . Highest education level: Associate degree: academic program  Occupational History  . Occupation: Administrator, arts  Social Needs  . Financial resource strain: Not on file  . Food insecurity:    Worry: Not on file    Inability: Not on file  . Transportation needs:    Medical: Not on file    Non-medical: Not on file  Tobacco Use  . Smoking status: Never Smoker  . Smokeless tobacco: Never Used  Substance and Sexual Activity  . Alcohol use: Yes    Comment: Rarely  . Drug use: No  . Sexual activity: Yes    Birth control/protection: Surgical  Lifestyle  . Physical activity:    Days per week: Not on file    Minutes per session: Not on file  . Stress: Not on file  Relationships  . Social connections:    Talks on phone: Not on file    Gets together: Not on file    Attends religious service: Not on file    Active member  of club or organization: Not on file    Attends meetings of clubs or organizations: Not on file    Relationship status: Not on file  Other Topics Concern  . Not on file  Social History Narrative   Lives at home with her daughter   Right handed   Drinks 3 cups of caffeine daily   Past Surgical History:  Procedure Laterality Date  . ABDOMINAL HYSTERECTOMY N/A 07/16/2013   Procedure: HYSTERECTOMY ABDOMINAL;  Surgeon: Allyn Kenner, DO;  Location: [facility];  Service: Gynecology;  Laterality: N/A;  . BILATERAL SALPINGECTOMY Bilateral 07/16/2013   Procedure: BILATERAL SALPINGECTOMY;  Surgeon: Allyn Kenner, DO;  Location: [facility];  Service: Gynecology;  Laterality: Bilateral;  . CARPAL TUNNEL RELEASE   01/16/2012   Procedure: CARPAL TUNNEL RELEASE;  Surgeon: Nita Sells, MD;  Location: [facility];  Service: Orthopedics;  Laterality: Left;  . CARPAL TUNNEL RELEASE  02/20/2012   Procedure: CARPAL TUNNEL RELEASE;  Surgeon: Nita Sells, MD;  Location: [facility];  Service: Orthopedics;  Laterality: Right;  . COLONOSCOPY    . CYSTO WITH HYDRODISTENSION N/A 07/16/2013   Procedure: CYSTOSCOPY/HYDRODISTENSION;  Surgeon: Reece Packer, MD;  Location: [facility];  Service: Urology;  Laterality: N/A;  . DILATION AND CURETTAGE OF UTERUS    . FOOT SURGERY    . HYSTEROSCOPY W/D&C  10/31/2011   Procedure: DILATATION AND CURETTAGE /HYSTEROSCOPY;  Surgeon: Allyn Kenner, DO;  Location: [facility];  Service: Gynecology;  Laterality: N/A;  . LAPAROSCOPY N/A 07/16/2013   Procedure: LAPAROSCOPY DIAGNOSTIC;  Surgeon: Allyn Kenner, DO;  Location: [facility];  Service: Gynecology;  Laterality: N/A;  . PLANTAR FASCIA SURGERY    . PUBOVAGINAL SLING N/A 07/16/2013   Procedure: Gaynelle Arabian;  Surgeon: Reece Packer, MD;  Location: [facility];  Service: Urology;  Laterality: N/A;  . TUBAL LIGATION    . VAGINAL DELIVERY  1995,  2003   Past Medical History:  Diagnosis Date  . Chlamydia   . Diabetes ([place])   . GERD (gastroesophageal reflux disease)   . Hyperlipidemia   . Hypertension   . Migraine   . Obstructive sleep apnea on CPAP    pt has not worn CPAP in years  . Seasonal allergies   . Wears glasses    BP (!) 195/116 (BP Location: Left Arm, Patient Position: Sitting, Cuff Size: Normal)   Pulse 83   Resp 14   Ht 5\' 6"  (1.676 m)   Wt 219 lb (99.3 kg)   LMP 06/27/2013   SpO2 96%   BMI 35.35 kg/m    Opioid Risk Score:   Fall Risk Score:  `1  Depression screen PHQ 2/9  Depression screen [facility] 2/9 11/20/2017 05/25/2017  Decreased Interest 2 3  Down, Depressed, Hopeless 2 1  PHQ - 2 Score 4 4  Altered sleeping 3 2  Tired, decreased energy 2 3    Change in appetite 2 1  Feeling bad or failure about yourself  3 0  Trouble concentrating 3 3  Moving slowly or fidgety/restless 2 0  Suicidal thoughts 1 0  PHQ-9 Score 20 13  Difficult doing work/chores Extremely dIfficult Not difficult at all   Review of Systems  Constitutional: Negative.   HENT: Negative.   Eyes: Negative.   Respiratory: Negative.   Cardiovascular: Negative.   Gastrointestinal: Negative.   Endocrine: Negative.   Genitourinary: Negative.   Musculoskeletal: Negative.   Skin: Negative.   Allergic/Immunologic: Negative.   Neurological:  Positive for dizziness and headaches.  Hematological: Negative.   Psychiatric/Behavioral: Negative.        Objective:   Physical Exam General: No acute distress HEENT: EOMI, oral membranes moist Cards: reg rate  Chest: normal effort Abdomen: Soft, NT, ND Skin: dry, intact Extremities: no edema  Neuro:   Patient alert to self.  Did not know what office this was or why she was here.  She asked who I was.  She recognizes her sister.  Language is intact.  Can perseverate on certain issues and selectively attends to certain fragments of conversation.  Balance is fairly functional.  Did not perform a detailed motor and vestibular examination today.  Musculoskeletal: Full ROM, No pain with AROM or PROM in the trunk, or extremities. Posture appropriate.  She does have some tightness in the left greater than right trapezius muscles.  Flexion and to a lesser extent extension and rotation were limited during examination today and cause some pain.  She has some tightness in the lower cervical segments as well.  I palpated over the crown of her head and she was tender there as well although there were no obvious palpable prominences or irregularities. Psych: Patient generally pleasant.  She was a bit disinhibited at times but not in any aggressive way.  No signs of agitation or irritation.  She did not appear depressed.         Assessment & Plan:  1. Post concussion syndrome with ongoing headaches, memory loss, tinnitus, vestibular symptoms, insomnia and emotional lability 2.  History of migraine headaches since her middle school years.  Patient had been seen twice by neurology here in [place].  Apparently she has been on numerous medications in the past for prophylaxis and treatment 3. Uncontrolled hypertension    Plan:  1.  Need to increase nortriptyline to 25mg . Need to keep  2.   Resume PT when BP controlled.  3.   Await initial SLP referral  4.  MRI today is pending. Hopefully this will shed some light on the etiology of her deficits 5.  Neuropsychological evaluation to better assess cognition and behavior.  6. Discussed the importance of BP control. It appears that it has been elevated for some time. It has been so malignant that I have to wonder if some of her presentation is related to this. At the very least her headaches could be driven by her BP. Have to consider PRES in the differential.  I went ahead today and increased her coreg to 25mg  bid. She has a cardiology appointment scheduled in [place] on Thursday. The sister asked me if she should be admitted. I don't think it would be out of the question at least consider. The MRI results may influence the decision making there.   7. Follow up with me in about 6 weeks time. Thirty minutes of face to face patient care time were spent during this visit. All questions were encouraged and answered. Reviewed additionally with case manager.

## 2017-12-20 ENCOUNTER — Telehealth: Payer: Self-pay | Admitting: *Deleted

## 2017-12-20 NOTE — Telephone Encounter (Signed)
Lenna Sciara, Nurse Case Manager, [facility] left a message stating that inpatient rehab was discussed at last visit.  She reports that patient agrees to plan. Nurse case manager is asking for a call back from Dr. Naaman Plummer to discuss next steps to get patient into inpatient rehab.  [place] [phone]

## 2017-12-22 NOTE — Telephone Encounter (Signed)
I spoke with Bridget Anderson. Also contacted Karene Fry, inpatient rehab admissions coordinator and forwarded her the appropriate information.

## 2017-12-22 NOTE — Telephone Encounter (Signed)
2nd call, asking if Dr. Naaman Plummer received this previous message regarding getting patient into inpatient rehab. Please see previous thread

## 2018-01-01 ENCOUNTER — Encounter (HOSPITAL_COMMUNITY): Payer: Self-pay | Admitting: *Deleted

## 2018-01-01 ENCOUNTER — Other Ambulatory Visit: Payer: Self-pay

## 2018-01-01 ENCOUNTER — Encounter: Payer: Self-pay | Admitting: *Deleted

## 2018-01-01 ENCOUNTER — Inpatient Hospital Stay (HOSPITAL_COMMUNITY)
Admission: RE | Admit: 2018-01-01 | Discharge: 2018-01-05 | DRG: 093 | Disposition: A | Discharge status: Home Health | Payer: No Typology Code available for payment source | Source: Ambulatory Visit | Attending: Physical Medicine & Rehabilitation | Admitting: Physical Medicine & Rehabilitation

## 2018-01-01 DIAGNOSIS — E559 Vitamin D deficiency, unspecified: Secondary | ICD-10-CM

## 2018-01-01 DIAGNOSIS — E119 Type 2 diabetes mellitus without complications: Secondary | ICD-10-CM | POA: Diagnosis present

## 2018-01-01 DIAGNOSIS — W208XXS Other cause of strike by thrown, projected or falling object, sequela: Secondary | ICD-10-CM | POA: Diagnosis present

## 2018-01-01 DIAGNOSIS — S06890S Other specified intracranial injury without loss of consciousness, sequela: Secondary | ICD-10-CM | POA: Diagnosis not present

## 2018-01-01 DIAGNOSIS — Z8249 Family history of ischemic heart disease and other diseases of the circulatory system: Secondary | ICD-10-CM

## 2018-01-01 DIAGNOSIS — Z91013 Allergy to seafood: Secondary | ICD-10-CM | POA: Diagnosis not present

## 2018-01-01 DIAGNOSIS — I10 Essential (primary) hypertension: Secondary | ICD-10-CM | POA: Diagnosis present

## 2018-01-01 DIAGNOSIS — Z90710 Acquired absence of both cervix and uterus: Secondary | ICD-10-CM | POA: Diagnosis not present

## 2018-01-01 DIAGNOSIS — K219 Gastro-esophageal reflux disease without esophagitis: Secondary | ICD-10-CM | POA: Diagnosis present

## 2018-01-01 DIAGNOSIS — Z7951 Long term (current) use of inhaled steroids: Secondary | ICD-10-CM | POA: Diagnosis not present

## 2018-01-01 DIAGNOSIS — Z8349 Family history of other endocrine, nutritional and metabolic diseases: Secondary | ICD-10-CM

## 2018-01-01 DIAGNOSIS — G43909 Migraine, unspecified, not intractable, without status migrainosus: Secondary | ICD-10-CM | POA: Diagnosis present

## 2018-01-01 DIAGNOSIS — R411 Anterograde amnesia: Secondary | ICD-10-CM | POA: Diagnosis present

## 2018-01-01 DIAGNOSIS — F068 Other specified mental disorders due to known physiological condition: Secondary | ICD-10-CM | POA: Diagnosis present

## 2018-01-01 DIAGNOSIS — J45909 Unspecified asthma, uncomplicated: Secondary | ICD-10-CM | POA: Diagnosis present

## 2018-01-01 DIAGNOSIS — E669 Obesity, unspecified: Secondary | ICD-10-CM | POA: Diagnosis not present

## 2018-01-01 DIAGNOSIS — E7849 Other hyperlipidemia: Secondary | ICD-10-CM

## 2018-01-01 DIAGNOSIS — E785 Hyperlipidemia, unspecified: Secondary | ICD-10-CM | POA: Diagnosis present

## 2018-01-01 DIAGNOSIS — G4733 Obstructive sleep apnea (adult) (pediatric): Secondary | ICD-10-CM | POA: Diagnosis present

## 2018-01-01 DIAGNOSIS — Z9119 Patient's noncompliance with other medical treatment and regimen: Secondary | ICD-10-CM | POA: Diagnosis not present

## 2018-01-01 DIAGNOSIS — Z888 Allergy status to other drugs, medicaments and biological substances status: Secondary | ICD-10-CM | POA: Diagnosis not present

## 2018-01-01 DIAGNOSIS — F0781 Postconcussional syndrome: Secondary | ICD-10-CM | POA: Diagnosis present

## 2018-01-01 DIAGNOSIS — G441 Vascular headache, not elsewhere classified: Secondary | ICD-10-CM | POA: Diagnosis not present

## 2018-01-01 DIAGNOSIS — E1169 Type 2 diabetes mellitus with other specified complication: Secondary | ICD-10-CM

## 2018-01-01 DIAGNOSIS — G44329 Chronic post-traumatic headache, not intractable: Secondary | ICD-10-CM

## 2018-01-01 MED ORDER — METFORMIN HCL 500 MG PO TABS
500.0000 mg | ORAL_TABLET | Freq: Every day | ORAL | Status: DC
  Administered 2018-01-02 – 2018-01-05 (×4): 500 mg via ORAL
  Filled 2018-01-01 (×4): qty 1

## 2018-01-01 MED ORDER — NORTRIPTYLINE HCL 10 MG PO CAPS
10.0000 mg | ORAL_CAPSULE | Freq: Every day | ORAL | Status: DC
  Administered 2018-01-01 – 2018-01-04 (×4): 10 mg via ORAL
  Filled 2018-01-01 (×4): qty 1

## 2018-01-01 MED ORDER — ALBUTEROL SULFATE HFA 108 (90 BASE) MCG/ACT IN AERS: 1.0000 | INHALATION_SPRAY | Freq: Four times a day (QID) | RESPIRATORY_TRACT | Status: DC | PRN

## 2018-01-01 MED ORDER — ONDANSETRON HCL 4 MG PO TABS
4.0000 mg | ORAL_TABLET | Freq: Four times a day (QID) | ORAL | Status: DC | PRN
Start: 2018-01-01 — End: 2018-01-05

## 2018-01-01 MED ORDER — ALBUTEROL SULFATE (2.5 MG/3ML) 0.083% IN NEBU: 2.5000 mg | INHALATION_SOLUTION | Freq: Four times a day (QID) | RESPIRATORY_TRACT | Status: DC | PRN

## 2018-01-01 MED ORDER — BUTALBITAL-APAP-CAFFEINE 50-325-40 MG PO TABS
1.0000 | ORAL_TABLET | Freq: Four times a day (QID) | ORAL | Status: DC | PRN
  Administered 2018-01-01 – 2018-01-04 (×6): 1 via ORAL
  Filled 2018-01-01 (×6): qty 1

## 2018-01-01 MED ORDER — SORBITOL 70 % SOLN: 30.0000 mL | Freq: Every day | Status: DC | PRN

## 2018-01-01 MED ORDER — ONDANSETRON HCL 4 MG/2ML IJ SOLN: 4.0000 mg | Freq: Four times a day (QID) | INTRAMUSCULAR | Status: DC | PRN

## 2018-01-01 MED ORDER — PRAVASTATIN SODIUM 20 MG PO TABS
10.0000 mg | ORAL_TABLET | Freq: Every day | ORAL | Status: DC
  Administered 2018-01-01 – 2018-01-04 (×4): 10 mg via ORAL
  Filled 2018-01-01 (×4): qty 1

## 2018-01-01 MED ORDER — BENAZEPRIL HCL 40 MG PO TABS
40.0000 mg | ORAL_TABLET | Freq: Two times a day (BID) | ORAL | Status: DC
  Administered 2018-01-01 – 2018-01-05 (×8): 40 mg via ORAL
  Filled 2018-01-01 (×8): qty 1

## 2018-01-01 MED ORDER — METOPROLOL TARTRATE 50 MG PO TABS
50.0000 mg | ORAL_TABLET | Freq: Two times a day (BID) | ORAL | Status: DC
  Administered 2018-01-01 – 2018-01-02 (×2): 50 mg via ORAL
  Filled 2018-01-01: qty 1

## 2018-01-01 NOTE — H&P (Signed)
Physical Medicine and Rehabilitation Admission H&P     HPI: Bridget Anderson is a 47 year old right handed female history of hypertension, migraine headaches, followed by neurology services Dr. Milta Deiters  in the past, diabetes mellitus. Per chart review and family, patient lives with her 36 year old daughter. She has a sister in the area as well as a 67 year old daughter who attends college in a mother. Per report patient was involved in a work-related accident at [employer] 08/05/2017 where an 85 lb pipe  fell and struck her in the head. She initially presented to [facility]  after the accident complaining of nausea and vertigo with headache. She could not recall full events of the accident. CT of the head reviewed, unremarkable for acute intracranial process.  She was treated for a scalp contusion and then released. There was no reported loss of consciousness but claims to have transient loss of vision and hearing. She again presented to [facility] department on 08/08/2017 with increased headache, nausea, dizziness and blurred vision. She was also having ringing in her ears. She did not any focal weakness. CTA of the neck negative for dissection.she was again released to home given a trial of antiemetic. Patient noted persistent headaches as well as bouts of memory loss. Patient has been followed at the outpatient rehabilitation center for postconcussion syndrome and also plan for neuropsychological workup. She was attending outpatient therapies noted some elevated blood pressure 202/122. There had been initial plans for a follow-up MRI however after most recent follow-up with outpatient rehabilitation services it was recommended patient would benefit from an inpatient rehabilitation comprehensive program.  Review of Systems  Constitutional: Negative for chills, fever and malaise/fatigue.  HENT: Negative for hearing loss.   Eyes: Negative for blurred vision and double vision.    Respiratory: Negative for cough and shortness of breath.   Cardiovascular: Negative for chest pain, palpitations and leg swelling.  Gastrointestinal:       GERD  Genitourinary: Negative for dysuria, flank pain and hematuria.  Musculoskeletal: Positive for myalgias.  Skin: Negative for rash.  Neurological: Positive for dizziness and headaches. Negative for sensory change, speech change, focal weakness and weakness.  Psychiatric/Behavioral: Positive for memory loss.  All other systems reviewed and are negative.  Past Medical History:  Diagnosis Date  . Chlamydia   . Diabetes ([place])   . GERD (gastroesophageal reflux disease)   . Hyperlipidemia   . Hypertension   . Migraine   . Obstructive sleep apnea on CPAP    pt has not worn CPAP in years  . Seasonal allergies   . Wears glasses    Past Surgical History:  Procedure Laterality Date  . ABDOMINAL HYSTERECTOMY N/A 07/16/2013   Procedure: HYSTERECTOMY ABDOMINAL;  Surgeon: Allyn Kenner, DO;  Location: [facility];  Service: Gynecology;  Laterality: N/A;  . BILATERAL SALPINGECTOMY Bilateral 07/16/2013   Procedure: BILATERAL SALPINGECTOMY;  Surgeon: Allyn Kenner, DO;  Location: [facility];  Service: Gynecology;  Laterality: Bilateral;  . CARPAL TUNNEL RELEASE  01/16/2012   Procedure: CARPAL TUNNEL RELEASE;  Surgeon: Nita Sells, MD;  Location: [place];  Service: Orthopedics;  Laterality: Left;  . CARPAL TUNNEL RELEASE  02/20/2012   Procedure: CARPAL TUNNEL RELEASE;  Surgeon: Nita Sells, MD;  Location: [place];  Service: Orthopedics;  Laterality: Right;  . COLONOSCOPY    . CYSTO WITH HYDRODISTENSION N/A 07/16/2013   Procedure: CYSTOSCOPY/HYDRODISTENSION;  Surgeon: Reece Packer, MD;  Location: [facility];  Service: Urology;  Laterality: N/A;  . DILATION AND CURETTAGE OF UTERUS    . FOOT SURGERY    . HYSTEROSCOPY W/D&C  10/31/2011   Procedure: DILATATION AND CURETTAGE /HYSTEROSCOPY;   Surgeon: Allyn Kenner, DO;  Location: [facility];  Service: Gynecology;  Laterality: N/A;  . LAPAROSCOPY N/A 07/16/2013   Procedure: LAPAROSCOPY DIAGNOSTIC;  Surgeon: Allyn Kenner, DO;  Location: [facility];  Service: Gynecology;  Laterality: N/A;  . PLANTAR FASCIA SURGERY    . PUBOVAGINAL SLING N/A 07/16/2013   Procedure: Gaynelle Arabian;  Surgeon: Reece Packer, MD;  Location: [facility];  Service: Urology;  Laterality: N/A;  . TUBAL LIGATION    . VAGINAL DELIVERY  1995,  2003   Family History  Problem Relation Age of Onset  . Hypertension Mother   . Hyperlipidemia Mother   . Hypertension Father   . Hyperlipidemia Father   . Thyroid disease Father   . Hypertension Brother    Social History:  reports that she has never smoked. She has never used smokeless tobacco. She reports that she drinks alcohol. She reports that she does not use drugs. Allergies:  Allergies  Allergen Reactions  . Shellfish Allergy Anaphylaxis    Allergic to some shellfish but all regular fish.  . Prednisone     Headache   No medications prior to admission.    Drug Regimen Review Drug regimen was reviewed and remains appropriate with no significant issues identified  Home:     Functional History:  Patient independent prior to admission however noted issues in regards to insomnia nausea headache and memory loss  Functional Status:  Mobility:  Independent        LFY:[id number] with ADLs    Cognition:  noted bouts of memory loss as well as recall    Physical Exam: Last menstrual period 06/27/2013. Physical Exam  Vitals reviewed. Constitutional: She appears well-developed.  Obese  HENT:  Head: Normocephalic and atraumatic.  Eyes: EOM are normal. Right eye exhibits no discharge. Left eye exhibits no discharge.  Neck: Normal range of motion. Neck supple. No thyromegaly present.  Cardiovascular: Normal rate, regular rhythm and normal heart sounds.  Respiratory: Effort normal and breath  sounds normal. No respiratory distress.  GI: Soft. Bowel sounds are normal. She exhibits no distension.  Musculoskeletal:  No edema or tenderness in extremities  Neurological: She is alert.  Patient follows full commands.  She does have obvious memory loss of recent ED visits. Provides her name and age.  She and family are quite focused on going home. Motor: 5/5 throughout Deficits with short-term memory, attention/concentration  Skin: Skin is warm and dry.  Psychiatric: She has a normal mood and affect. Her behavior is normal. Cognition and memory are impaired.    Medical Problem List and Plan: 1.  Memory loss, headaches, tinnitus, vestibular symptoms, insomnia and emotional liability secondary to postconcussion syndrome 2.  DVT Prophylaxis/Anticoagulation: SCDs. 3. Pain Management: butalbital one capsule by mouth every 6 hours as needed for headaches 4. Mood:  Pamelor 20 mg bedtime Provide emotional support 5. Neuropsych: This patient is capable of making decisions on her own behalf. 6. Skin/Wound Care: Routine skin checks 7. Fluids/Electrolytes/Nutrition:  Routine ins and outs with follow-up chemistries 8. Hyperlipidemia. Lovastatin 9. Diabetes mellitus. Metformin 500 mg daily 10. Hypertension. Benazepril 40 mg twice daily,metoprolol 50 mg twice daily 11. Asthma. Symbicort/pro-air   Post Admission Physician Evaluation: 1. Preadmission assessment reviewed and changes made below. 2. Functional deficits secondary  to postconcussive syndrome. 3.  Patient is admitted to receive collaborative, interdisciplinary care between the physiatrist, rehab nursing staff, and therapy team. [place] will provide 24 hour management of medical needs as well as oversight of the therapy plan/treatment and provide guidance as appropriate. 5. 24 hour rehab nursing will assist with safety and medication administration  and help integrate therapy concepts, techniques,education, etc. 6. PT will  assess and treat for/with: Lower extremity balance, mobility, safety, coping skills, pain control, education. Goals are: Independent. 7. OT will assess and treat for/with: ADL's, adaptive techniques and equipment, ego support, and community reintegration.   Goals are: Independent. Therapy may proceed with showering this patient. 8. SLP will assess and treat for/with: Cognition.  Goals are: Modified independent. 9. Case Management and Social Worker will assess and treat for psychological issues and discharge planning. 10. Team conference will be held weekly to assess progress toward goals and to determine barriers to discharge. 11. Patient will receive at least 3 hours of therapy per day at least 5 days per week. 12. ELOS: 3-7 days       13. Prognosis:  excellent and good  I have personally performed a face to face diagnostic evaluation, including, but not limited to relevant history and physical exam findings, of this patient and developed relevant assessment and plan.  Additionally, I have reviewed and concur with the physician assistant's documentation above.   Delice Lesch, MD, ABPMR Bridget Paganini Angiulli, PA-C 01/01/2018

## 2018-01-01 NOTE — PMR Pre-admission (Signed)
[facility] PMR Admission Coordinator Pre-Admission Assessment  Patient: Bridget Anderson is an 47 y.o., female MRN: [id number] DOB: 1970-09-17 Height: _0  (167.6 cm) Weight: 99.3 kg  Insurance Information HMO:      PPO:       PCP:       IPA:       80/20:       OTHER:   PRIMARY: [place] - Workers Comp    Policy#: [id number] IF      Subscriber:  patient CM Name: Bridget Anderson      Phone#: [phone]     Fax#: [phone] Pre-Cert#: [id number] IF      Employer: [employer] Benefits:  Phone #: [phone]     Name:   Bridget Anderson. Date:  Date of injury 08/04/17     Deduct:        Out of Pocket Max:        Life Max:   CIR:        SNF:   Outpatient:       Co-Pay:   Home Health:        Co-Pay:   DME:       Co-Pay:   Providers:  Note:  Agricultural consultant for workers comp Bridget Anderson [phone] and fax [phone]  Emergency Contact Information Contact Information    Name Relation Home Work [place] Sister [phone]  [phone]   Bridget Anderson Mother [phone]  [phone]      Current Medical History  Patient Admitting Diagnosis: Head Injury - hit on the head by a pipe; post concussive syndrome   History of Present Illness: Bridget Anderson is being admitted from home today for further evaluation of her post concussive syndrome while in the inpatient setting.  The following are notes from Dr. Naaman Plummer office visit in 11/20/2017:  This is a 47 year old female with a history of migraines here for initial evaluation of postconcussion syndrome.  Patient was involved in a work-related accident on 08/04/2017 where an 85 pound pipe fell on the top of her head.  She presented to [facility] emergency department on 08/04/2017 after the incident.  She complained of nausea and vertigo with headache. She is foggy about what exactly happened.  CT of the head was read as negative.  She was treated for a scalp contusion and then released.  Patient denies loss of  consciousness but claims to have had transient loss of vision and hearing.  Patient re-presented to [facility] emergency department on 6/18 with increased headache nausea, dizziness, and blurred vision.  She also reported ringing in her ears at that time.  She denied focal weakness.  At that time a CT a of the neck was performed to rule out vertebral dissection.  CT was negative for dissection and only revealed enlarged thyroid gland as well as cervical lymphadenopathy.   Headaches are most severe over the crown of her head, and she has the sensation of pain "running down the back of her head and neck. The pain is continuous. She has been taking tylenol for pain relief. She hasn't used heat or ice, any topical medication. She has maxalt but hasn't used for some time. She was given a trial of zonegran in January which apparently she didn't tolerate. She has migraine headaches back to her middle school days. Prior to the accident, she was having 2-3 migraines per month which were often located in her temporal areas and were associated with nausea. She has been seen locally for  her migraine headaches headaches by Dr. Jaynee Eagles in January.  She also had a sleep study performed which was interpreted by Dr. Brett Fairy.  The study revealed mild snoring and very mild apnea without hypoxemia.  Weight loss was recommended.  It was felt that her fatigue was much too significant to be explained by the findings and narcolepsy was mentioned.  Additionally, she is having problems with short term,new memory. She finds that she can easily misplace items. Her concentration is poor also. Sleep is affected as well. She is able to fall asleep but then typically awakens after a few hours. She hasn't taken anything to help her rest.   She denies depression but sister reports labile behavior ranging from depression to agitation.   She works at [employer]. 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  Patient was  accompanied by her sister today who has witnessed the changes after this accident.  Patient appears to have been happy with her job and is anxious to return to work.  Patient's medical record from [facility] ED department, notes from workers comp case Freight forwarder and office note from Dr. Naaman Plummer have been reviewed by the rehabilitation admission coordinator and physician.  Past Medical History  Past Medical History:  Diagnosis Date  . Chlamydia   . Diabetes ([place])   . GERD (gastroesophageal reflux disease)   . Hyperlipidemia   . Hypertension   . Migraine   . Obstructive sleep apnea on CPAP    pt has not worn CPAP in years  . Seasonal allergies   . Wears glasses     Family History   family history includes Hyperlipidemia in her father and mother; Hypertension in her brother, father, and mother; Thyroid disease in her father.  Prior Rehab/Hospitalizations Has the patient had major surgery during 100 days prior to admission? No   Current Medications  Current Outpatient Medications:  .  atorvastatin (LIPITOR) 10 MG tablet, Take 1 tablet (10 mg total) by mouth daily., Disp: 30 tablet, Rfl: 0 .  Blood Glucose Monitoring Suppl (ONETOUCH VERIO FLEX SYSTEM) w/Device KIT, USE AS DIRECTED, Disp: 1 kit, Rfl: 0 .  carvedilol (COREG) 25 MG tablet, Take 1 tablet (25 mg total) by mouth 2 (two) times daily with a meal., Disp: 60 tablet, Rfl: 3 .  chlorthalidone (HYGROTON) 25 MG tablet, Take 1 tablet (25 mg total) by mouth daily., Disp: 30 tablet, Rfl: 0 .  glucose blood test strip, Twice daily, Disp: 100 each, Rfl: 0 .  metFORMIN (GLUCOPHAGE) 1000 MG tablet, Take 1 tablet (1,000 mg total) by mouth 2 (two) times daily with a meal., Disp: 60 tablet, Rfl: 0 .  nortriptyline (PAMELOR) 25 MG capsule, Take 1 capsule (25 mg total) by mouth at bedtime., Disp: 30 capsule, Rfl: 2 .  ONETOUCH DELICA LANCETS 01S MISC, 1 Package by Does not apply route 2 (two) times daily., Disp: 100 each, Rfl: 0 .  rizatriptan  (MAXALT) 10 MG tablet, Take 1 tablet (10 mg total) by mouth as needed for migraine. May repeat in 2 hours if needed, Disp: 10 tablet, Rfl: 6 .  Vitamin D, Ergocalciferol, (DRISDOL) 50000 units CAPS capsule, Take 1 capsule (50,000 Units total) by mouth every 7 (seven) days., Disp: 4 capsule, Rfl: 0 .  zonisamide (ZONEGRAN) 50 MG capsule, Take 2 capsules (100 mg total) by mouth daily., Disp: 60 capsule, Rfl: 8  Patients Current Diet:  Regular diet, thin liquids Diet Order    None  Precautions / Restrictions None  Has the patient had 2 or more falls or a fall with injury in the past year?No  Prior Activity Level Community (5-7x/wk): Goes out daily.  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  Prior Functional Level Do you want Prior Function Level of Independence: Independent   Self Care: Did the patient need help bathing, dressing, using the toilet or eating?  Independent  Indoor Mobility: Did the patient need assistance with walking from room to room (with or without device)? Independent  Stairs: Did the patient need assistance with internal or external stairs (with or without device)? Independent  Functional Cognition: Did the patient need help planning regular tasks such as shopping or remembering to take medications? Needed some help.  Sister assists with medication management.   Home Assistive Devices / Equipment Home Assistive Devices/Equipment: None  Prior Device Use: Indicate devices/aids used by the patient prior to current illness, exacerbation or injury? None  Prior Functional Level Independent and working prior to accident 08/04/17   Prior Functional Level Current Functional Level  Bed Mobility Independent Independent  Transfers I I  Mobility: Ambulate/ W/C I I  Stairs I I  Upper Body Dressing I I  Lower Body Dressing I I  Grooming I I  Eating/Drinking I I  Toilet Transfer WDL WDL  Bladder  Continence WDL WDL  Bowel Management  WDL WDL  Stair Climbing I I  Communication WDL Sister reports that personality is different  Memory WDL Short term memory problems; mild memory impairment  Cooking/Meal Prep  Independent      Housework  Independent   Money Management  Independent   Driving  Yes, drives short distances that she is familiar with.     Special needs/care consideration BiPAP/CPAP No CPM No Continuous Drip IV No Dialysis No       Life Vest No Oxygen No Special Bed No Trach Size no Wound Vac (area) no      Skin Tenderness on her head where she was hit by the pipe                          Bowel mgmt: WDL  Bladder mgmt:WDL  Diabetic mgmt Yes, is a diabetic  Previous Home Environment Living Arrangements: Children(Has a 1 yr daughter and a 69 yo daughter.)  Lives With: Daughter Available Help at Discharge: Family, Available PRN/intermittently Type of Home: House Home Layout: Two level, Able to live on main level with bedroom/bathroom Alternate Level Stairs-Number of Steps: Flight Home Access: Level entry Bathroom Shower/Tub: Tub/shower unit, Curtain  Discharge Living Setting Plans for Discharge Living Setting: House, Lives with (comment)(Lives with 80 yo daughter.  Sister lives down the street.) Type of Home at Discharge: House Discharge Home Layout: Two level, Able to live on main level with bedroom/bathroom Alternate Level Stairs-Number of Steps: Flight Discharge Home Access: Level entry Discharge Bathroom Shower/Tub: Tub/shower unit, Curtain Discharge Bathroom Toilet: Standard Discharge Bathroom Accessibility: Yes How Accessible: Accessible via walker Does the patient have any problems obtaining your medications?: No  Social/Family/Support Systems Patient Roles: Parent(Has a daughter 45 and daughter 39 yo) Sport and exercise psychologist Information: Charlene Brooke - sister Anticipated Caregiver: Sister and family Anticipated Caregiver's Contact Information: [name] -  sister - [phone] Ability/Limitations of Caregiver: Sister works 3rd shift.  Mom and dad check in every 3-4 hours. Caregiver Availability: Intermittent Discharge Plan Discussed with Primary Caregiver: Yes Is  Caregiver In Agreement with Plan?: Yes Does Caregiver/Family have Issues with Lodging/Transportation while Pt is in Rehab?: No  Goals/Additional Needs Patient/Family Goal for Rehab: To have PT/OT/SLP evaluations and have evaluation by neuorpsychologist(Needs eval for memory and cognitive deficits.) Expected length of stay: 7 days Cultural Considerations: None Dietary Needs: Regular diet, thin liquids Equipment Needs: TBD Additional Information: Is experiencing anxiety and difficulty sleeping.(Drives short distances to familiar places.) Pt/Family Agrees to Admission and willing to participate: Yes Program Orientation Provided & Reviewed with Pt/Caregiver Including Roles  & Responsibilities: Yes  Patient Condition: I have reviewed medical records from [place] notes and Dr. Naaman Plummer office notes, spoken with Bridget Anderson, workers comp Tourist information centre manager), and (patient's sister, Charlene Brooke). I spoke with patient's sister via phone for inpatient rehabilitation assessment.  Patient will benefit from ongoing (PT, OT, and SLP), can actively participate in 3 hours of therapy a day 5 days of the week, and will be assessed by our neuropsychologist during this admission.  Patient will also benefit from the coordinated team approach during an Inpatient Acute Rehabilitation admission.  The patient will receive intensive therapy as well as Rehabilitation physician, nursing, social worker, and care management interventions. The patient is currently independent with mobility and basic ADLs.  Patient does have higher level balance issues and cognitive deficits including memory impairments.  Return to home with outpatient therapy follow-up is anticipated. Patient has agreed to participate in  the Acute Inpatient Rehabilitation Program and will admit today.  Preadmission Screen Completed By:  Retta Diones, 01/01/2018 9:50 AM ______________________________________________________________________   Discussed status with Dr. Naaman Plummer and Dr. Posey Pronto on 01/01/18 at 1029 and received telephone approval for admission today.  Admission Coordinator:  Retta Diones, time 1037/Date 01/01/18   Assessment/Plan: Diagnosis:  Post-concussive syndrome 1. Co-Morbidities requiring supervision/potential complications: Migraines 2. Does the patient benefit from coordinated care of a physician, rehab nurse, PT (1-2 hrs/day, 5 days/week), OT (1-2 hrs/day, 5 days/week) and SLP (1-2 hrs/day, 5 days/week) to address physical and functional deficits in the context of the above medical diagnosis(es)? Potentially Addressing deficits in the following areas: balance, endurance, cognition and psychosocial support 3. Can the patient actively participate in an intensive therapy program of at least 3 hrs of therapy 5 days a week? Yes 4. Anticipated functional outcomes upon discharge from inpatients are: independent PT, independent OT, independent SLP 5. Estimated rehab length of stay to reach the above functional goals is: 4-7 days. 6. Anticipated D/C setting: Home 7. Anticipated post D/C treatments: Outpatient therapy 8. Overall Rehab/Functional Prognosis: excellent    RECOMMENDATIONS: Patient to be admitted to CIR. Patient has agreed to participate in recommended program. Yes Note that insurance prior authorization may be required for reimbursement for recommended care.  Retta Diones 01/01/2018  Delice Lesch, MD, ABPMR 01/01/2018

## 2018-01-01 NOTE — Progress Notes (Signed)
Pt arrived to hospital with all belongings. Pt alert and oriented. Pt has family at bedside. Continue plan of care.

## 2018-01-01 NOTE — Progress Notes (Signed)
Patient information reviewed and entered into eRehab system by Derren Suydam, RN, CRRN, PPS Coordinator.  Information including medical coding, functional ability and quality indicators will be reviewed and updated through discharge.    

## 2018-01-01 NOTE — Progress Notes (Signed)
Pt was hypertensive upon admission with BP 146/113. Pt's daughter reports that patient has persistent hypertension at home. PA made aware of patient's blood pressure. Will continue to monitor.

## 2018-01-02 ENCOUNTER — Inpatient Hospital Stay (HOSPITAL_COMMUNITY): Payer: Self-pay | Admitting: Speech Pathology

## 2018-01-02 ENCOUNTER — Inpatient Hospital Stay (HOSPITAL_COMMUNITY): Payer: Self-pay | Admitting: Occupational Therapy

## 2018-01-02 ENCOUNTER — Inpatient Hospital Stay (HOSPITAL_COMMUNITY): Payer: Self-pay | Admitting: Physical Therapy

## 2018-01-02 ENCOUNTER — Encounter (HOSPITAL_COMMUNITY): Payer: Self-pay | Admitting: Psychology

## 2018-01-02 DIAGNOSIS — R411 Anterograde amnesia: Secondary | ICD-10-CM

## 2018-01-02 LAB — CBC WITH DIFFERENTIAL/PLATELET
Abs Immature Granulocytes: 0.03 10*3/uL (ref 0.00–0.07)
BASOS ABS: 0.1 10*3/uL (ref 0.0–0.1)
Basophils Relative: 1 %
EOS ABS: 0.5 10*3/uL (ref 0.0–0.5)
EOS PCT: 5 %
HEMATOCRIT: 39.1 % (ref 36.0–46.0)
Hemoglobin: 11.6 g/dL — ABNORMAL LOW (ref 12.0–15.0)
Immature Granulocytes: 0 %
LYMPHS ABS: 4.1 10*3/uL — AB (ref 0.7–4.0)
Lymphocytes Relative: 42 %
MCH: 24.5 pg — AB (ref 26.0–34.0)
MCHC: 29.7 g/dL — AB (ref 30.0–36.0)
MCV: 82.5 fL (ref 80.0–100.0)
MONO ABS: 0.6 10*3/uL (ref 0.1–1.0)
Monocytes Relative: 6 %
NRBC: 0 % (ref 0.0–0.2)
Neutro Abs: 4.5 10*3/uL (ref 1.7–7.7)
Neutrophils Relative %: 46 %
Platelets: 324 10*3/uL (ref 150–400)
RBC: 4.74 MIL/uL (ref 3.87–5.11)
RDW: 13.2 % (ref 11.5–15.5)
WBC: 9.8 10*3/uL (ref 4.0–10.5)

## 2018-01-02 LAB — COMPREHENSIVE METABOLIC PANEL
ALK PHOS: 76 U/L (ref 38–126)
ALT: 13 U/L (ref 0–44)
AST: 14 U/L — ABNORMAL LOW (ref 15–41)
Albumin: 3.1 g/dL — ABNORMAL LOW (ref 3.5–5.0)
Anion gap: 4 — ABNORMAL LOW (ref 5–15)
BUN: 12 mg/dL (ref 6–20)
CALCIUM: 8.7 mg/dL — AB (ref 8.9–10.3)
CO2: 26 mmol/L (ref 22–32)
CREATININE: 0.84 mg/dL (ref 0.44–1.00)
Chloride: 105 mmol/L (ref 98–111)
GFR calc non Af Amer: >60 mL/min (ref >60)
Glucose, Bld: 198 mg/dL — ABNORMAL HIGH (ref 70–99)
Potassium: 3.9 mmol/L (ref 3.5–5.1)
SODIUM: 135 mmol/L (ref 135–145)
Total Bilirubin: 0.5 mg/dL (ref 0.3–1.2)
Total Protein: 7.1 g/dL (ref 6.5–8.1)

## 2018-01-02 MED ORDER — METOPROLOL TARTRATE 25 MG PO TABS
25.0000 mg | ORAL_TABLET | Freq: Once | ORAL | Status: AC
  Administered 2018-01-02: 25 mg via ORAL
  Filled 2018-01-02: qty 1

## 2018-01-02 MED ORDER — METHYLPHENIDATE HCL 5 MG PO TABS
5.0000 mg | ORAL_TABLET | Freq: Two times a day (BID) | ORAL | Status: DC
  Administered 2018-01-03 – 2018-01-04 (×4): 5 mg via ORAL
  Filled 2018-01-02 (×4): qty 1

## 2018-01-02 MED ORDER — METOPROLOL TARTRATE 50 MG PO TABS
75.0000 mg | ORAL_TABLET | Freq: Two times a day (BID) | ORAL | Status: DC
  Administered 2018-01-02 – 2018-01-05 (×6): 75 mg via ORAL
  Filled 2018-01-02 (×7): qty 1

## 2018-01-02 NOTE — Progress Notes (Signed)
PA aware and reviewing/monitoring for changes. Will continue to monitor.

## 2018-01-02 NOTE — Progress Notes (Signed)
Retta Diones, RN  Rehab Admission Coordinator  Physical Medicine and Rehabilitation  PMR Pre-admission  Signed  Encounter Date:  01/01/2018       Related encounter: Documentation from 01/01/2018 in [place]      Signed         Show:Clear all _0 Manual_1 Template_2 Copied  Added by: _3 Retta Diones, RN_4 Jamse Arn, MD  _5 Hover for details [employer] PMR Admission Coordinator Pre-Admission Assessment  Patient: Bridget Anderson is an 47 y.o., female MRN: [id number] DOB: 1970-05-14 Height: _6  (167.6 cm) Weight: 99.3 kg  Insurance Information HMO:      PPO:       PCP:       IPA:       80/20:       OTHER:   PRIMARY: [employer] - Workers Comp    Policy#: [id number] IF      Subscriber:  patient CM Name: Wallene Huh      Phone#: [phone]     Fax#: [phone] Pre-Cert#: [id number] IF      Employer: [employer] Benefits:  Phone #: [phone]     Name:   Irene Shipper. Date:  Date of injury 08/04/17     Deduct:        Out of Pocket Max:        Life Max:   CIR:        SNF:   Outpatient:       Co-Pay:   Home Health:        Co-Pay:   DME:       Co-Pay:   Providers:  Note:  Agricultural consultant for workers comp Maryanna Shape [phone] and fax [phone]  Emergency Contact Information         Contact Information    Name Relation Home Work [place] Sister [phone]  [phone]   Swilling,[name] Mother [phone]  [phone]      Current Medical History  Patient Admitting Diagnosis: Head Injury - hit on the head by a pipe; post concussive syndrome   History of Present Illness: Jose is being admitted from home today for further evaluation of her post concussive syndrome while in the inpatient setting.  The following are notes from Dr. Naaman Plummer office visit in 11/20/2017:  This is a 47 year old femalewith a history of migraineshere for initial evaluation of  postconcussion syndrome. Patient was involved in a work-related accident on 08/04/2017 where an 85 pound pipefell on the top of her head. She presented to [facility] emergency department on 08/04/2017 after the incident. She complained of nausea and vertigo with headache. She is foggy about what exactly happened.CT of the head was read as negative. She was treated for a scalp contusion and then released. Patient denies loss of consciousness but claims to have had transient loss of vision and hearing. Patient re-presented to [facility] emergency department on 6/18 with increased headache nausea, dizziness, and blurred vision. She also reported ringing in her ears at that time. She denied focal weakness. At that time a CT a of the neck was performed to rule out vertebral dissection. CT was negative for dissection and only revealed enlarged thyroid gland as well as cervical lymphadenopathy.   Headaches are most severe over the crown of her head, and she has the sensation of pain "running down the back of her head and neck. The pain is continuous. She has been taking tylenol for pain relief. She hasn't used heat or ice, any topical medication.  She has maxalt but hasn't used for some time. She was given a trial of zonegran in January which apparently she didn't tolerate. She has migraine headaches back to her middle school days. Prior to the accident, she was having 2-3 migraines per month which were often located in her temporal areas and were associated with nausea. Nunzio Cory been seen locally for her migraine headachesheadaches by Dr. Blima Singer January. She also had a sleep study performed which was interpreted by Dr. Brett Fairy. The study revealed mild snoring and very mild apnea without hypoxemia. Weight loss was recommended. It was felt that her fatigue was much too significant to be explained by the findings and narcolepsy was mentioned.  Additionally, she is having problems with short term,new  memory. She finds that she can easily misplace items. Her concentration is poor also. Sleep is affected as well. She is able to fall asleep but then typically awakens after a few hours. She hasn't taken anything to help her rest.   She denies depression butsister reportslabile behavior ranging from depression to agitation.   She works at [employer]. 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 Patient appears to have been happy with her job and is anxious to return to work.  Patient's medical record from [facility] ED department, notes from workers comp case Freight forwarder and office note from Dr. Naaman Plummer have been reviewed by the rehabilitation admission coordinator and physician.  Past Medical History      Past Medical History:  Diagnosis Date  . Chlamydia   . Diabetes ([place])   . GERD (gastroesophageal reflux disease)   . Hyperlipidemia   . Hypertension   . Migraine   . Obstructive sleep apnea on CPAP    pt has not worn CPAP in years  . Seasonal allergies   . Wears glasses     Family History   family history includes Hyperlipidemia in her father and mother; Hypertension in her brother, father, and mother; Thyroid disease in her father.  Prior Rehab/Hospitalizations Has the patient had major surgery during 100 days prior to admission? No              Current Medications  Current Outpatient Medications:  .  atorvastatin (LIPITOR) 10 MG tablet, Take 1 tablet (10 mg total) by mouth daily., Disp: 30 tablet, Rfl: 0 .  Blood Glucose Monitoring Suppl (ONETOUCH VERIO FLEX SYSTEM) w/Device KIT, USE AS DIRECTED, Disp: 1 kit, Rfl: 0 .  carvedilol (COREG) 25 MG tablet, Take 1 tablet (25 mg total) by mouth 2 (two) times daily with a meal., Disp: 60 tablet, Rfl: 3 .  chlorthalidone (HYGROTON) 25 MG tablet, Take 1 tablet (25 mg total) by mouth daily., Disp: 30 tablet, Rfl:  0 .  glucose blood test strip, Twice daily, Disp: 100 each, Rfl: 0 .  metFORMIN (GLUCOPHAGE) 1000 MG tablet, Take 1 tablet (1,000 mg total) by mouth 2 (two) times daily with a meal., Disp: 60 tablet, Rfl: 0 .  nortriptyline (PAMELOR) 25 MG capsule, Take 1 capsule (25 mg total) by mouth at bedtime., Disp: 30 capsule, Rfl: 2 .  ONETOUCH DELICA LANCETS 93O MISC, 1 Package by Does not apply route 2 (two) times daily., Disp: 100 each, Rfl: 0 .  rizatriptan (MAXALT) 10 MG tablet, Take 1 tablet (10 mg total) by mouth as needed for migraine. May repeat in 2 hours if needed, Disp: 10 tablet, Rfl: 6 .  Vitamin D, Ergocalciferol, (DRISDOL) 50000 units CAPS capsule, Take 1 capsule (50,000 Units total) by mouth every 7 (seven) days., Disp: 4 capsule, Rfl: 0 .  zonisamide (ZONEGRAN) 50 MG capsule, Take 2 capsules (100 mg total) by mouth daily., Disp: 60 capsule, Rfl: 8  Patients Current Diet:  Regular diet, thin liquids Diet Order    None      Precautions / Restrictions None  Has the patient had 2 or more falls or a fall with injury in the past year?No  Prior Activity Level Community (5-7x/wk): Goes out daily.  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  Prior Functional Level Do you want Prior Function Level of Independence: Independent   Self Care: Did the patient need help bathing, dressing, using the toilet or eating?  Independent  Indoor Mobility: Did the patient need assistance with walking from room to room (with or without device)? Independent  Stairs: Did the patient need assistance with internal or external stairs (with or without device)? Independent  Functional Cognition: Did the patient need help planning regular tasks such as shopping or remembering to take medications? Needed some help.  Sister assists with medication management.   Home Assistive Devices / Equipment Home Assistive Devices/Equipment:  None  Prior Device Use: Indicate devices/aids used by the patient prior to current illness, exacerbation or injury? None  Prior Functional Level Independent and working prior to accident 08/04/17   Prior Functional Level Current Functional Level  Bed Mobility Independent Independent  Transfers I I  Mobility: Ambulate/ W/C I I  Stairs I I  Upper Body Dressing I I  Lower Body Dressing I I  Grooming I I  Eating/Drinking I I  Toilet Transfer WDL WDL  Bladder Continence WDL WDL  Bowel Management  WDL WDL  Stair Climbing I I  Communication WDL Sister reports that personality is different  Memory WDL Short term memory problems; mild memory impairment  Cooking/Meal Prep  Independent      Housework  Independent   Money Management  Independent   Driving  Yes, drives short distances that she is familiar with.            Special needs/care consideration BiPAP/CPAP No CPM No Continuous Drip IV No Dialysis No       Life Vest No Oxygen No Special Bed No Trach Size no Wound Vac (area) no      Skin Tenderness on her head where she was hit by the pipe                          Bowel mgmt: WDL       Bladder mgmt:WDL     Diabetic mgmt Yes, is a diabetic  Previous Home Environment Living Arrangements: Children(Has a 45 yr daughter and a 17 yo daughter.)  Lives With: Daughter Available Help at Discharge: Family, Available PRN/intermittently Type of Home: House Home Layout: Two level, Able to live on main level with bedroom/bathroom Alternate Level Stairs-Number of Steps: Flight Home Access: Level entry Bathroom Shower/Tub: Tub/shower unit, Curtain  Discharge Living Setting Plans for Discharge Living Setting: House, Lives with (comment)(Lives with 23 yo daughter.  Sister lives down the street.) Type of Home at Discharge: House Discharge Home Layout: Two level, Able to live on main level with bedroom/bathroom Alternate Level Stairs-Number of Steps:  Flight Discharge Home Access: Level entry Discharge Bathroom Shower/Tub: Tub/shower unit, Curtain Discharge Bathroom  Toilet: Standard Discharge Bathroom Accessibility: Yes How Accessible: Accessible via walker Does the patient have any problems obtaining your medications?: No  Social/Family/Support Systems Patient Roles: Parent(Has a daughter 29 and daughter 72 yo) Contact Information: Charlene Brooke - sister Anticipated Caregiver: Sister and family Anticipated Caregiver's Contact Information: [name] - sister - [phone] Ability/Limitations of Caregiver: Sister works 3rd shift.  Mom and dad check in every 3-4 hours. Caregiver Availability: Intermittent Discharge Plan Discussed with Primary Caregiver: Yes Is Caregiver In Agreement with Plan?: Yes Does Caregiver/Family have Issues with Lodging/Transportation while Pt is in Rehab?: No  Goals/Additional Needs Patient/Family Goal for Rehab: To have PT/OT/SLP evaluations and have evaluation by neuorpsychologist(Needs eval for memory and cognitive deficits.) Expected length of stay: 7 days Cultural Considerations: None Dietary Needs: Regular diet, thin liquids Equipment Needs: TBD Additional Information: Is experiencing anxiety and difficulty sleeping.(Drives short distances to familiar places.) Pt/Family Agrees to Admission and willing to participate: Yes Program Orientation Provided & Reviewed with Pt/Caregiver Including Roles  & Responsibilities: Yes  Patient Condition: I have reviewed medical records from [place] notes and Dr. Naaman Plummer office notes, spoken with Wallene Huh, workers comp Tourist information centre manager), and (patient's sister, Charlene Brooke). I spoke with patient's sister via phone for inpatient rehabilitation assessment.  Patient will benefit from ongoing (PT, OT, and SLP), can actively participate in 3 hours of therapy a day 5 days of the week, and will be assessed by our neuropsychologist during this admission.   Patient will also benefit from the coordinated team approach during an Inpatient Acute Rehabilitation admission.  The patient will receive intensive therapy as well as Rehabilitation physician, nursing, social worker, and care management interventions. The patient is currently independent with mobility and basic ADLs.  Patient does have higher level balance issues and cognitive deficits including memory impairments.  Return to home with outpatient therapy follow-up is anticipated. Patient has agreed to participate in the Acute Inpatient Rehabilitation Program and will admit today.  Preadmission Screen Completed By:  Retta Diones, 01/01/2018 9:50 AM ______________________________________________________________________   Discussed status with Dr. Naaman Plummer and Dr. Posey Pronto on 01/01/18 at 1029 and received telephone approval for admission today.  Admission Coordinator:  Retta Diones, time 1037/Date 01/01/18   Assessment/Plan: Diagnosis:  Post-concussive syndrome 1. Co-Morbidities requiring supervision/potential complications: Migraines 2. Does the patient benefit from coordinated care of a physician, rehab nurse, PT (1-2 hrs/day, 5 days/week), OT (1-2 hrs/day, 5 days/week) and SLP (1-2 hrs/day, 5 days/week) to address physical and functional deficits in the context of the above medical diagnosis(es)? Potentially Addressing deficits in the following areas: balance, endurance, cognition and psychosocial support 3. Can the patient actively participate in an intensive therapy program of at least 3 hrs of therapy 5 days a week? Yes 4. Anticipated functional outcomes upon discharge from inpatients are: independent PT, independent OT, independent SLP 5. Estimated rehab length of stay to reach the above functional goals is: 4-7 days. 6. Anticipated D/C setting: Home 7. Anticipated post D/C treatments: Outpatient therapy 8. Overall Rehab/Functional Prognosis: excellent    RECOMMENDATIONS: Patient  to be admitted to CIR. Patient has agreed to participate in recommended program. Yes Note that insurance prior authorization may be required for reimbursement for recommended care.  Retta Diones 01/01/2018  Delice Lesch, MD, ABPMR 01/01/2018        Revision History

## 2018-01-02 NOTE — Consult Note (Signed)
Neuropsychological Consultation   Patient:   Bridget Anderson   DOB:   07/07/1970  MR Number:  [id number]  Location:  [facility] [place] [id number] [place] [address] Dept: [phone] Loc: [phone]           Date of Service:   01/02/2018  Start Time:   3 PM End Time:   4:30 PM  Provider/Observer:  Ilean Skill, Psy.D.       Clinical Neuropsychologist       Billing Code/Service: [phone] 4 Units  Chief Complaint:    Bridget Anderson is a 47 year old right-handed female with a history of hypertension, migraine headaches, and diabetes.  The patient is reported to have been involved in a work-related accident on 08/05/2017 where an 85 pound pipe fell and struck the patient on the head.  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  The patient reports that she remembers this happening or most of what happened.  The patient reports that "I can still remember the feeling of it hitting me" and reports that she could "feel it in her teeth."  The patient reports that initially she could not see or hear anything but oriented to self leaning over the pipe and was in the area by herself.  The patient reports that she called someone on her cell phone to come and assist.  The patient reports that she does not think she had a full loss of consciousness but there was an altered consciousness.  When team members came down they ended up driving her to the hospital.  The patient reports that she remembers being in a great deal of pain and remembers the events in the ED and that she was seen in the emergency department for a long time.  The patient reports that she remembers the night at home after it happened quite clearly but when she woke up in the morning everything is far as memory and recall was fuzzy for any new event and she is continued to have  anterior grade amnesia from the morning after the accident continuing to this day.  The patient reports that she remembers all of her old memories quite well.  There are some minor word finding issues.  The patient does have a history of migraines and hypertension.  During her emergency department presentation at [facility] she was complaining of nausea and vertigo with headache.  She acknowledged that she did not recall the full events of the accident.  CT scan of the head was reviewed from that time and was unremarkable for intracranial process.  She was treated for a scalp contusion and then released.  There was only transient loss of vision and hearing and no complete loss of consciousness.  The patient again presented to the emergency department on 08/08/2017 with increased headache, nausea, dizziness and blurred vision.  She was also experiencing ringing in her ears.  The patient has continued to report persistent headaches as well as bouts of memory loss.  The patient describes her memory difficulties related to any new event.  She reports that she will meet with 1 of her doctors and be able to follow along and remember the conversation while it is active.  However, once the interaction with others have been completed she quickly loses memory of what is been happening or what has  transpired.  The patient has trouble remembering any new event but is well oriented and with good executive functioning during the event itself.  The patient did have an MRI of her head without contrast on 12/19/2017 through [place].  The impressions of this MRI showed no acute findings and there were no indications of acute or remote evidence of intracranial hemorrhage.  Reason for Service:  The patient was referred for neuropsychological consultation to facilitate with diagnostic considerations with interventions to also include objective neuropsychological testing.  Today was the initial clinical interview  with the patient.  Below is the HPI for the current admission.  HPI: Bridget Anderson is a 47 year old right handed female history of hypertension, migraine headaches, followed by neurology services Dr. Milta Deiters  in the past, diabetes mellitus. Per chart review and family, patient lives with her 45 year old daughter. She has a sister in the area as well as a 59 year old daughter who attends college in a mother. Per report patient was involved in a work-related accident at [facility] [employer] [employer] [facility] 08/05/2017 where an 85 lb pipe  fell and struck her in the head. She initially presented to [facility]  after the accident complaining of nausea and vertigo with headache. She could not recall full events of the accident. CT of the head reviewed, unremarkable for acute intracranial process.  She was treated for a scalp contusion and then released. There was no reported loss of consciousness but claims to have transient loss of vision and hearing. She again presented to [facility] on 08/08/2017 with increased headache, nausea, dizziness and blurred vision. She was also having ringing in her ears. She did not any focal weakness. CTA of the neck negative for dissection.she was again released to home given a trial of antiemetic. Patient noted persistent headaches as well as bouts of memory loss. Patient has been followed at the outpatient rehabilitation center for postconcussion syndrome and also plan for neuropsychological workup. She was attending outpatient therapies noted some elevated blood pressure 202/122. There had been initial plans for a follow-up MRI however after most recent follow-up with outpatient rehabilitation services it was recommended patient would benefit from an inpatient rehabilitation comprehensive program.  The patient was also seen by [facility]on Anna for speech therapy.  Below is her report on brief cognitive testing.  Patient administered all subtests of the Cognitive Linguistic Quick  Test (CLQT) with scores demonstrating cognitive impairments in all cognitive domains with the exception of the clock drawing test. Patient demonstrated severe impairments in memory and visuospatial skills and moderate impairments in attention, executive functioning and language. Patient's function also appeared to be impacted by patient's low frustration tolerance, intermittent lability, reported embarrassment and what clinician perceived as anxiety throughout the assessment. However, patient remained engaged and participatory throughout entire evaluation. Patient would benefit from skilled SLP intervention to maximize patient's cognitive functioning, especially in regards to education and utilization of memory compensatory strategies in order for patient to maximize her functional independence and reduce caregiver burden prior to discharge.   Current Status:  The patient describes feeling very disoriented and has difficulty feeling comfortable due to significant memory deficits.  The patient describes very clear anterior grade memory deficits.  The patient reports that events that happened as recently as just a couple of hours prior will be lost almost completely.  The patient reports that this is very discomforting and she is feeling very anxious and stressed by her inability to remember and process what is happened in the  very recent past.  The patient was unable to name various doctors although she was able to have some sense of some of the doctors that she had seen recently.  The patient knew that she had been in physical therapy recently but could not remember what was done.  The patient reports that she has been experiencing more anxiety and feels quite anxious and stressed being so unsure about what is happened recently.  Reliability of Information: The information is derived from 1 hour face-to-face clinical interview with the patient as well as review of available medical records.  Behavioral  Observation: Bridget Anderson  presents as a 47 y.o.-year-old Right African American Female who appeared her stated age. her dress was Appropriate and she was Well Groomed and her manners were Appropriate to the situation.  her participation was indicative of Appropriate, Inattentive and Redirectable behaviors.  There were not any physical disabilities noted.  she displayed an appropriate level of cooperation and motivation.     Interactions:    Active Appropriate, Inattentive and Redirectable  Attention:   abnormal and attention span appeared shorter than expected for age  Memory:   abnormal; remote memory intact, recent memory impaired  Visuo-spatial:  not examined  Speech (Volume):  normal  Speech:   normal; some mild word finding deficits  Thought Process:  Coherent and Relevant  Though Content:  WNL; not suicidal and not homicidal  Orientation:   person, place and time/date  Judgment:   Fair  Planning:   Fair  Affect:    Anxious, Depressed and Tearful  Mood:    Anxious  Insight:   Fair  Intelligence:   normal  Medical History:   Past Medical History:  Diagnosis Date  . Chlamydia   . Diabetes ([place])   . GERD (gastroesophageal reflux disease)   . Hyperlipidemia   . Hypertension   . Migraine   . Obstructive sleep apnea on CPAP    pt has not worn CPAP in years  . Seasonal allergies   . Wears glasses         Abuse/Trauma History: The patient was involved in work related accident where she was hit on head by 85lb pipe that became dislodged while cleaning it.    Psychiatric History:  No prior psychiatric history noted.  Family Med/Psych History:  Family History  Problem Relation Age of Onset  . Hypertension Mother   . Hyperlipidemia Mother   . Hypertension Father   . Hyperlipidemia Father   . Thyroid disease Father   . Hypertension Brother     Risk of Suicide/Violence: low Patient denies SI or HI.  Impression/DX:  Bridget Anderson is a 47 year old right-handed  female with a history of hypertension, migraine headaches, and diabetes.  The patient is reported to have been involved in a work-related accident on 08/05/2017 where an 85 pound pipe fell and struck the patient on the head.  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  The patient reports that she remembers this happening or most of what happened.  The patient reports that "I can still remember the feeling of it hitting me" and reports that she could "feel it in her teeth."  The patient reports that initially she could not see or hear anything but oriented to self leaning over the pipe and was in the area by herself.  The patient reports that she called someone on her cell  phone to come and assist.  The patient reports that she does not think she had a full loss of consciousness but there was an altered consciousness.  When team members came down they ended up driving her to the hospital.  The patient reports that she remembers being in a great deal of pain and remembers the events in the ED and that she was seen in the emergency department for a long time.  The patient reports that she remembers the night at home after it happened quite clearly but when she woke up in the morning everything is far as memory and recall was fuzzy for any new event and she is continued to have anterior grade amnesia from the morning after the accident continuing to this day.  The patient reports that she remembers all of her old memories quite well.  There are some minor word finding issues.  The patient does have a history of migraines and hypertension.  During her emergency department presentation at [facility] she was complaining of nausea and vertigo with headache.  She acknowledged that she did not recall the full events of the accident.  CT scan of the head was reviewed from that time and was unremarkable for intracranial process.  She was  treated for a scalp contusion and then released.  There was only transient loss of vision and hearing and no complete loss of consciousness.  The patient again presented to the emergency department on 08/08/2017 with increased headache, nausea, dizziness and blurred vision.  She was also experiencing ringing in her ears.  The patient has continued to report persistent headaches as well as bouts of memory loss.  The patient describes her memory difficulties related to any new event.  She reports that she will meet with 1 of her doctors and be able to follow along and remember the conversation while it is active.  However, once the interaction with others have been completed she quickly loses memory of what is been happening or what has transpired.  The patient has trouble remembering any new event but is well oriented and with good executive functioning during the event itself.  The patient did have an MRI of her head without contrast on 12/19/2017 through [place].  The impressions of this MRI showed no acute findings and there were no indications of acute or remote evidence of intracranial hemorrhage.  The patient describes feeling very disoriented and has difficulty feeling comfortable due to significant memory deficits.  The patient describes very clear anterior grade memory deficits.  The patient reports that events that happened as recently as just a couple of hours prior will be lost almost completely.  The patient reports that this is very discomforting and she is feeling very anxious and stressed by her inability to remember and process what is happened in the very recent past.  The patient was unable to name various doctors although she was able to have some sense of some of the doctors that she had seen recently.  The patient knew that she had been in physical therapy recently but could not remember what was done.  The patient reports that she has been experiencing more anxiety and feels quite  anxious and stressed being so unsure about what is happened recently.  While the MRI did not suggest acute or remote evidence of intracranial hemorrhage, if patient's symptoms are neurological in nature they would be consistent with very small and focal injury to the mammillothalamic tract or the Anterior  thalamic nucleus.   The actual MRI was not reviewed but it may be worthwhile to take a very close look at these brain regions as the possible lesion/infarct could be very small in nature.    Disposition/Plan:  I will be initially administrating the RBANDS A tomorrow.  This will be initial neuropsych testing.  Will plan from there.    Diagnosis:    Postconcussive syndrome - Plan: Ambulatory referral to Physical Medicine Rehab         Electronically Signed   _______________________ Ilean Skill, Psy.D.

## 2018-01-02 NOTE — Progress Notes (Signed)
B/P elevated 176/103, medications administered, pt declined medication for h/a at this time, seems to be in no discomfort at this time and seen ambulating in room. Pt did report that didn't want to take Metformin d/t GI issues, encouraged to take and also c/o of pressure on chest last night like "someone sitting on her". Didn't report to nurse.  Doesn't feel that way now. Reports that thinks she has felt like that in past but not sure. Called PA left msg. Pt currently in therapy. Will continue to monitor.

## 2018-01-02 NOTE — Plan of Care (Signed)
  Problem: Consults Goal: RH GENERAL PATIENT EDUCATION Description See Patient Education module for education specifics. Outcome: Progressing   Problem: RH SAFETY Goal: RH STG ADHERE TO SAFETY PRECAUTIONS W/ASSISTANCE/DEVICE Description STG Adhere to Safety Precautions With Mod I Assistance/Device.  Outcome: Progressing   Problem: RH PAIN MANAGEMENT Goal: RH STG PAIN MANAGED AT OR BELOW PT'S PAIN GOAL Description < 3 out of 10.   Outcome: Progressing   Problem: Consults Goal: RH BRAIN INJURY PATIENT EDUCATION Description Description: See Patient Education module for eduction specifics Outcome: Progressing   Problem: RH Pre-functional/Other (Specify) Goal: RH LTG Interdisciplinary (Specify) 1 Description RH LTG Interdisciplinary (Specify)1 Outcome: Progressing

## 2018-01-02 NOTE — Progress Notes (Addendum)
[place] PHYSICAL MEDICINE & REHABILITATION PROGRESS NOTE   Subjective/Complaints: Pt asked how long she has to stay. Does not recall me whatsoever from prior encounters despite referring to me yesterday (per PA). Denies pain, headache.   ROS: Patient denies fever, rash, sore throat, blurred vision, nausea, vomiting, diarrhea, cough, shortness of breath or chest pain, joint or back pain, headache, or mood change.     Objective:   No results found. Recent Labs    01/02/18 0451  WBC 9.8  HGB 11.6*  HCT 39.1  PLT 324   Recent Labs    01/02/18 0451  NA 135  K 3.9  CL 105  CO2 26  GLUCOSE 198*  BUN 12  CREATININE 0.84  CALCIUM 8.7*    Intake/Output Summary (Last 24 hours) at 01/02/2018 0826 Last data filed at 01/01/2018 2127 Gross per 24 hour  Intake 60 ml  Output -  Net 60 ml     Physical Exam: Vital Signs Blood pressure 140/79, pulse (!) 57, temperature 98.6 F (37 C), temperature source Oral, resp. rate 18, last menstrual period 06/27/2013, SpO2 100 %.  General: alert, oriented to self, hospital. HEENT: Head is normocephalic, atraumatic, PERRLA, EOMI, sclera anicteric, oral mucosa pink and moist, dentition intact, ext ear canals clear,  Neck: Supple without JVD or lymphadenopathy Heart: Reg rate and rhythm. No murmurs rubs or gallops Chest: CTA bilaterally without wheezes, rales, or rhonchi; no distress Abdomen: Soft, non-tender, non-distended, bowel sounds positive. Extremities: No clubbing, cyanosis, or edema. Pulses are 2+ Skin: Clean and intact without signs of breakdown Neuro: does not recall me. Fluently asks questions about her care here and what we will be doing here but then has moments where she loses concntration and is unable to complete thoughts.  Cranial nerves 2-12 are intact. Sensory exam is normal. Reflexes are 2+ in all 4's. Fine motor coordination is intact. No tremors. Motor function is grossly 5/5.  Musculoskeletal: Full ROM, No pain with  AROM or PROM in the neck, trunk, or extremities. Posture appropriate Psych: affect is anxious      Assessment/Plan: 1. Functional deficits secondary to post-concussion syndrome, ongoing cognitive deficits which require 3+ hours per day of interdisciplinary therapy in a comprehensive inpatient rehab setting.  Physiatrist is providing close team supervision and 24 hour management of active medical problems listed below.  Physiatrist and rehab team continue to assess barriers to discharge/monitor patient progress toward functional and medical goals  Care Tool:  Bathing    Body parts bathed by patient: Right arm, Left arm, Chest, Abdomen, Front perineal area, Buttocks, Right upper leg, Left upper leg, Right lower leg, Left lower leg, Face         Bathing assist Assist Level: Independent     Upper Body Dressing/Undressing Upper body dressing   What is the patient wearing?: Pull over shirt    Upper body assist Assist Level: Independent    Lower Body Dressing/Undressing Lower body dressing      What is the patient wearing?: Pants     Lower body assist Assist for lower body dressing: Independent     Toileting Toileting    Toileting assist Assist for toileting: Independent     Transfers Chair/bed transfer  Transfers assist     Chair/bed transfer assist level: Independent     Locomotion Ambulation   Ambulation assist              Walk 10 feet activity   Assist  Walk 50 feet activity   Assist           Walk 150 feet activity   Assist           Walk 10 feet on uneven surface  activity   Assist           Wheelchair     Assist               Wheelchair 50 feet with 2 turns activity    Assist            Wheelchair 150 feet activity     Assist            Medical Problem List and Plan: 1.  Memory loss, headaches, tinnitus, vestibular symptoms, insomnia and emotional liability secondary  to postconcussion syndrome  -goals of this admission are to assess in depth patient's ongoing memory deficits and executive dysfunction in addition to the contribution of any non-organic components to her clinical picture.   -she suffered a relatively mild brain injury 5 months ago. Most recent MRI performed two weeks ago does not reveal any abnormalities.   -SLP will assess cognitive function   -Neuropsych to assess for cognitive-behavioral deficits  -PT and OT can assess/treat  vestibular dysfunction and higher level cognitive deficits in the setting of iADL's/mobility-requiring tasks  -I spoke with patient regarding our intentions here. She expressed an understanding.  2.  DVT Prophylaxis/Anticoagulation: ambulatory 3. Pain Management: butalbital one capsule by mouth every 6 hours as needed for headaches 4. Mood:  Pamelor 20 mg bedtime   5. Neuropsych: This patient is capable of making decisions on her own behalf. 6. Skin/Wound Care: Routine skin checks 7. Fluids/Electrolytes/Nutrition:  encourage PO  -I personally reviewed all of the patient's labs today, and lab work is within normal limits.   8. Hyperlipidemia. Lovastatin 9. Diabetes mellitus. Metformin 500 mg daily 10. Hypertension. Benazepril 40 mg twice daily,metoprolol 50 mg twice daily  -increase metoprolol to 75mg  bid for better control 11. Asthma. Symbicort/pro-air    LOS: 1 days A FACE TO FACE EVALUATION WAS PERFORMED  Meredith Staggers 01/02/2018, 8:26 AM

## 2018-01-02 NOTE — Evaluation (Signed)
Occupational Therapy Assessment and Plan  Patient Details  Name: Bridget Anderson MRN: [id number] Date of Birth: [birth year]  OT Diagnosis: cognitive deficits Rehab Potential: Rehab Potential (ACUTE ONLY): Good ELOS: 3-5 days   Today's Date: 01/03/2018 OT Individual Time:  - 9:00-10:00 (44mn)       Problem List:  Patient Active Problem List   Diagnosis Date Noted  . Postconcussive syndrome 01/01/2018  . Asthma   . Essential hypertension   . Diabetes mellitus type 2 in obese ([facility]   . Dyslipidemia   . Vascular headache   . Chronic post-traumatic headache, not intractable 11/20/2017  . Post concussion syndrome 11/20/2017  . Shortness of breath on exertion 05/25/2017  . Other fatigue 05/25/2017  . Type 2 diabetes mellitus without complication, without long-term current use of insulin ([facility] 05/25/2017  . Uncontrolled hypertension 05/25/2017  . Chronic migraine without aura, with intractable migraine, so stated, with status migrainosus 03/20/2017  . S/P hysterectomy 07/16/2013    Past Medical History:  Past Medical History:  Diagnosis Date  . Chlamydia   . Diabetes ([place]   . GERD (gastroesophageal reflux disease)   . Hyperlipidemia   . Hypertension   . Migraine   . Obstructive sleep apnea on CPAP    pt has not worn CPAP in years  . Seasonal allergies   . Wears glasses    Past Surgical History:  Past Surgical History:  Procedure Laterality Date  . ABDOMINAL HYSTERECTOMY N/A 07/16/2013   Procedure: HYSTERECTOMY ABDOMINAL;  Surgeon: SAllyn Kenner DO;  [facility];  Service: Gynecology;  Laterality: N/A;  . BILATERAL SALPINGECTOMY Bilateral 07/16/2013   Procedure: BILATERAL SALPINGECTOMY;  Surgeon: SAllyn Kenner DO;  [facility];  Service: Gynecology;  Laterality: Bilateral;  . CARPAL TUNNEL RELEASE  01/16/2012   Procedure: CARPAL TUNNEL RELEASE;  Surgeon: JNita Sells MD;  [place] [facility]  Service: Orthopedics;  Laterality:  Left;  . CARPAL TUNNEL RELEASE  02/20/2012   Procedure: CARPAL TUNNEL RELEASE;  Surgeon: JNita Sells MD;  [place] [facility]  Service: Orthopedics;  Laterality: Right;  . COLONOSCOPY    . CYSTO WITH HYDRODISTENSION N/A 07/16/2013   Procedure: CYSTOSCOPY/HYDRODISTENSION;  Surgeon: SReece Packer MD;  [facility];  Service: Urology;  Laterality: N/A;  . DILATION AND CURETTAGE OF UTERUS    . FOOT SURGERY    . HYSTEROSCOPY W/D&C  10/31/2011   Procedure: DILATATION AND CURETTAGE /HYSTEROSCOPY;  Surgeon: SAllyn Kenner DO;  [facility];  Service: Gynecology;  Laterality: N/A;  . LAPAROSCOPY N/A 07/16/2013   Procedure: LAPAROSCOPY DIAGNOSTIC;  Surgeon: SAllyn Kenner DO;  [facility];  Service: Gynecology;  Laterality: N/A;  . PLANTAR FASCIA SURGERY    . PUBOVAGINAL SLING N/A 07/16/2013   Procedure: PGaynelle Arabian  Surgeon: SReece Packer MD;  [facility];  Service: Urology;  Laterality: N/A;  . TUBAL LIGATION    . VAGINAL DELIVERY  1995,  2003    Assessment & Plan Clinical Impression: Patient is a 47y.o. year old female right handed female history of hypertension, migraine headaches, followed by neurology services Dr. AMilta Deiters in the past, diabetes mellitus. Per chart review and family, patient lives with her 151year old daughter. She has a sister in the area as well as a 25year old daughter who attends college in a mother. Per report patient was involved in [place] [employer] where an 85 lb pipe  fell and struck her in  the head. She initially presented to [facility]  after the accident complaining of nausea and vertigo with headache. She could not recall full events of the accident. CT of the head reviewed, unremarkable for acute intracranial process.  She was treated for a scalp contusion and then released. There was no reported loss of consciousness but claims to have transient loss of  vision and hearing. [facility] with increased headache, nausea, dizziness and blurred vision. She was also having ringing in her ears. She did not any focal weakness. CTA of the neck negative for dissection.she was again released to home given a trial of antiemetic. Patient noted persistent headaches as well as bouts of memory loss. Patient has been followed at the outpatient rehabilitation center for postconcussion syndrome and also plan for neuropsychological workup. She was attending outpatient therapies noted some elevated blood pressure 202/122. There had been initial plans for a follow-up MRI however after most recent follow-up with outpatient rehabilitation services it was recommended patient would benefit from an inpatient rehabilitation comprehensive program.  Patient transferred to CIR on 01/01/2018 .    Patient currently requires mod to max  with IADL due to cognitive deficts  secondary to decreased awareness, decreased problem solving, decreased safety awareness and decreased memory.  Prior to hospitalization, patient could complete ADL idependently with extra time but requires max A to perform IADLS due to cognition.  Patient will benefit from skilled intervention to decrease level of assist with basic self-care skills and increase independence with basic self-care skills prior to discharge home with care partner.  Anticipate patient will require 24 hour supervision and follow up outpatient.  OT Assessment Rehab Potential (ACUTE ONLY): Good OT Patient demonstrates impairments in the following area(s): Cognition;Safety OT Additional Impairment(s): None OT Plan OT Intensity: Minimum of 1-2 x/day, 45 to 90 minutes OT Frequency: 5 out of 7 days OT Duration/Estimated Length of Stay: 3-5 days OT Treatment/Interventions: Community reintegration;Cognitive remediation/compensation;Discharge planning;Self Care/advanced ADL  retraining;Psychosocial support;Therapeutic Activities   Skilled Therapeutic Intervention OT eval initiated with pt with focus on assessment of cognitive functioning in ADL and IADL tasks. Pt demonstrates independence with mobility and basic ADL tasks including bathing at shower level, dressing from organized clothing, completing grooming at the sink, and toileting. Pt has difficulty with orientation of time including the date of the week and time of day.  Pt did demonstrate significant deficits in working memory within short term memory. Pt does require more than reasonable time to complete ADL tasks. Today spent time looking at 3 global elements of functional cogntive activity inlcuding self awareness, environment (physical space, object around the person) and time parameters, and schedules.  Pt with intellectual awareness but difficulty with emergent and anticipatory awareness which has an impact on her safety and ability to perform IADL independently.  Performed specific tasks with a checklist of 8 tasks (in seated position- due to elevated BP) including operating task on her phone and desk tasks. Pt required cues to refer back to her list each time one step had been completed, demonstrating poor working memory. Pt does demonstrate limited awareness of task errors but can correct once identified. Pt does become overwhelmed with having multiple choices.   OT Evaluation Precautions/Restrictions  Precautions Precaution Comments: congitive deficts - memory  Restrictions Weight Bearing Restrictions: No General Chart Reviewed: Yes Family/Caregiver Present: No Vital Signs Therapy Vitals BP: (!) 175/106 Patient Position (if appropriate): Sitting Pain Pain Assessment Pain Scale: (reports ongoing  discomfort in head) Home Living/Prior Functioning Home Living Family/patient expects to be discharged to:: Private residence Living Arrangements: Children Available Help at Discharge: Family, Available  PRN/intermittently Type of Home: House Home Access: Level entry Home Layout: Two level, Able to live on main level with bedroom/bathroom Bathroom Shower/Tub: Tub/shower unit, Curtain  Lives With: Daughter ADL ADL Eating: Independent Grooming: Independent Upper Body Bathing: Independent Lower Body Bathing: Independent Upper Body Dressing: Independent Lower Body Dressing: Independent Toileting: Independent Toilet Transfer: Programmer, applications Method: Magazine features editor: IT consultant Method: Ambulating Vision Baseline Vision/History: Wears glasses Wears Glasses: Distance only Patient Visual Report: No change from baseline Additional Comments: reports no changes and functionally no difficulty Perception  Perception: Within Functional Limits Praxis Praxis: Intact Cognition Overall Cognitive Status: Impaired/Different from baseline Arousal/Alertness: Awake/alert Orientation Level: Person;Place;Situation Person: Oriented Place: Oriented Situation: Oriented Year: 2019 Month: (unsure) Day of Week: Incorrect(monday) Memory: Impaired Memory Impairment: Decreased recall of new information;Decreased short term memory Decreased Short Term Memory: Functional basic;Verbal basic Immediate Memory Recall: Sock;Blue;Bed Memory Recall: (0/3) Attention: Selective Selective Attention: Appears intact Awareness: Impaired Awareness Impairment: Anticipatory impairment Problem Solving: Impaired Problem Solving Impairment: Functional basic Executive Function: Self Correcting;Decision Dispensing optician: Impaired Organizing Impairment: Functional basic;Verbal complex Decision Making: Impaired Decision Making Impairment: Functional basic;Verbal complex Self Correcting: Impaired Self Correcting Impairment: Functional basic;Verbal complex Behaviors: Poor frustration tolerance Safety/Judgment: Impaired Comments: impaired in the context  of managing IADLs safely; overestimates her abilities [employer] Scales of Cognitive Functioning: Purposeful/appropriate Sensation Sensation Light Touch: Appears Intact Hot/Cold: Appears Intact Proprioception: Appears Intact Stereognosis: Appears Intact Coordination Gross Motor Movements are Fluid and Coordinated: Yes Fine Motor Movements are Fluid and Coordinated: Yes Motor  Motor Motor: Within Functional Limits Mobility  Transfers Sit to Stand: Independent  Trunk/Postural Assessment  Cervical Assessment Cervical Assessment: Within Functional Limits Thoracic Assessment Thoracic Assessment: Within Functional Limits Lumbar Assessment Lumbar Assessment: Within Functional Limits Postural Control Postural Control: Within Functional Limits  Balance Balance Balance Assessed: Yes Dynamic Standing Balance Dynamic Standing - Balance Support: During functional activity Dynamic Standing - Level of Assistance: 7: Independent Dynamic Standing - Comments: during ADL tasks - standing to perform one limb standing Extremity/Trunk Assessment RUE Assessment RUE Assessment: Within Functional Limits LUE Assessment LUE Assessment: Within Functional Limits     Refer to Care Plan for Long Term Goals  Recommendations for other services: Neuropsych   Discharge Criteria: Patient will be discharged from OT if patient refuses treatment 3 consecutive times without medical reason, if treatment goals not met, if there is a change in medical status, if patient makes no progress towards goals or if patient is discharged from hospital.  The above assessment, treatment plan, treatment alternatives and goals were discussed and mutually agreed upon: by patient  Nicoletta Ba 01/02/2018, 3:03 PM

## 2018-01-02 NOTE — Progress Notes (Signed)
Speech Language Pathology Daily Session Note  Patient Details  Name: Bridget Anderson MRN: [id number] Date of Birth: November 26, 1970  Today's Date: 01/02/2018 SLP Individual Time: 1345-1415 SLP Individual Time Calculation (min): 30 min  Short Term Goals: Week 1: SLP Short Term Goal 1 (Week 1): STGs=LTGs due to short length of stay   Skilled Therapeutic Interventions: Skilled treatment session focused on cognitive goals. Upon arrival, patient reported she had no recall of this clinician or the events from our previous therapy session. Patient became tearful and emotional support provided.  Patient reported she would like to go home tomorrow but would also like to be independent at home when she is discharged. SLP provided Max A multimodal cues for anticipatory awareness in regards to discharge planning and the importance of learning and utilizing a variety of "cognitive tools" while on rehab that can maximize her function and minimize her frustration at home. She verbalized understanding but will need reinforcement of information. Patient also asking questions about prognosis, etc. SLP answered questions broadly but directed specific questions to physician. Patient left upright in recliner with all needs within reach. Continue with current plan of care.      Pain No/Denies Pain   Therapy/Group: Individual Therapy  Demareon Coldwell 01/02/2018, 3:28 PM

## 2018-01-02 NOTE — Evaluation (Signed)
Speech Language Pathology Assessment and Plan  Patient Details  Name: Bridget Anderson MRN: [id number] Date of Birth: 01/10/1971  SLP Diagnosis: Cognitive Impairments  Rehab Potential: Excellent ELOS: 3-5 days     Today's Date: 01/02/2018 SLP Individual Time: 8841-6606 SLP Individual Time Calculation (min): 60 min   Problem List:  Patient Active Problem List   Diagnosis Date Noted  . Postconcussive syndrome 01/01/2018  . Asthma   . Essential hypertension   . Diabetes mellitus type 2 in obese ([place])   . Dyslipidemia   . Vascular headache   . Chronic post-traumatic headache, not intractable 11/20/2017  . Post concussion syndrome 11/20/2017  . Shortness of breath on exertion 05/25/2017  . Other fatigue 05/25/2017  . Type 2 diabetes mellitus without complication, without long-term current use of insulin ([place]) 05/25/2017  . Uncontrolled hypertension 05/25/2017  . Chronic migraine without aura, with intractable migraine, so stated, with status migrainosus 03/20/2017  . S/P hysterectomy 07/16/2013   Past Medical History:  Past Medical History:  Diagnosis Date  . Chlamydia   . Diabetes ([place])   . GERD (gastroesophageal reflux disease)   . Hyperlipidemia   . Hypertension   . Migraine   . Obstructive sleep apnea on CPAP    pt has not worn CPAP in years  . Seasonal allergies   . Wears glasses    Past Surgical History:  Past Surgical History:  Procedure Laterality Date  . ABDOMINAL HYSTERECTOMY N/A 07/16/2013   Procedure: HYSTERECTOMY ABDOMINAL;  Surgeon: Allyn Kenner, DO;  Location: [facility];  Service: Gynecology;  Laterality: N/A;  . BILATERAL SALPINGECTOMY Bilateral 07/16/2013   Procedure: BILATERAL SALPINGECTOMY;  Surgeon: Allyn Kenner, DO;  Location: [facility];  Service: Gynecology;  Laterality: Bilateral;  . CARPAL TUNNEL RELEASE  01/16/2012   Procedure: CARPAL TUNNEL RELEASE;  Surgeon: Nita Sells, MD;  Location: [place];  Service: Orthopedics;   Laterality: Left;  . CARPAL TUNNEL RELEASE  02/20/2012   Procedure: CARPAL TUNNEL RELEASE;  Surgeon: Nita Sells, MD;  Location: [place];  Service: Orthopedics;  Laterality: Right;  . COLONOSCOPY    . CYSTO WITH HYDRODISTENSION N/A 07/16/2013   Procedure: CYSTOSCOPY/HYDRODISTENSION;  Surgeon: Reece Packer, MD;  Location: [facility];  Service: Urology;  Laterality: N/A;  . DILATION AND CURETTAGE OF UTERUS    . FOOT SURGERY    . HYSTEROSCOPY W/D&C  10/31/2011   Procedure: DILATATION AND CURETTAGE /HYSTEROSCOPY;  Surgeon: Allyn Kenner, DO;  Location: [facility];  Service: Gynecology;  Laterality: N/A;  . LAPAROSCOPY N/A 07/16/2013   Procedure: LAPAROSCOPY DIAGNOSTIC;  Surgeon: Allyn Kenner, DO;  Location: [facility];  Service: Gynecology;  Laterality: N/A;  . PLANTAR FASCIA SURGERY    . PUBOVAGINAL SLING N/A 07/16/2013   Procedure: Gaynelle Arabian;  Surgeon: Reece Packer, MD;  Location: [facility];  Service: Urology;  Laterality: N/A;  . TUBAL LIGATION    . VAGINAL DELIVERY  1995,  2003    Assessment / Plan / Recommendation Clinical Impression Patient is a 47 year old right handed female history of hypertension, migraine headaches, followed by neurology services Dr. Milta Deiters  in the past, diabetes mellitus. Per chart review and family, patient lives with her 35 year old daughter. She has a sister in the area as well as a 48 year old daughter who attends college in a mother. Per report patient was involved in a work-related accident at [employer] 08/05/2017 where an 85 lb pipe  fell and struck her in the head. She  initially presented to [facility]  after the accident complaining of nausea and vertigo with headache. She could not recall full events of the accident. CT of the head reviewed, unremarkable for acute intracranial process.  She was treated for a scalp contusion and then released. There was no reported loss of consciousness but claims to have  transient loss of vision and hearing. She [facility] presented [facility]2017 with increased headache, nausea, dizziness and blurred vision. She was also having ringing in her ears. She did not any focal weakness. CTA of the neck negative for dissection.she was again released to home given a trial of antiemetic. Patient noted persistent headaches as well as bouts of memory loss. Patient has been followed at the outpatient rehabilitation center for postconcussion syndrome and also plan for neuropsychological workup. She was attending outpatient therapies noted some elevated blood pressure 202/122. There had been initial plans for a follow-up MRI however after most recent follow-up with outpatient rehabilitation services it was recommended patient would benefit from an inpatient rehabilitation comprehensive program 01/01/18.   Patient administered all subtests of the Cognitive Linguistic Quick Test (CLQT) with scores demonstrating cognitive impairments in all cognitive domains with the exception of the clock drawing test. Patient demonstrated severe impairments in memory and visuospatial skills and moderate impairments in attention, executive functioning and language. Patient's function also appeared to be impacted by patient's low frustration tolerance, intermittent lability, reported embarrassment and what clinician perceived as anxiety throughout the assessment. However, patient remained engaged and participatory throughout entire evaluation. Patient would benefit from skilled SLP intervention to maximize patient's cognitive functioning, especially in regards to education and utilization of memory compensatory strategies in order for patient to maximize her functional independence and reduce caregiver burden prior to discharge.    Skilled Therapeutic Interventions          Administered a cognitive-linguistic standardized assessment, please see above for details. Educated patient in  regards to current cognitive impairments and goals of skilled SLP intervention. She verbalized understanding but will need reinforcement of information.    SLP Assessment  Patient will need skilled [place] [facility] Services during CIR admission    Recommendations  Oral Care Recommendations: Oral care BID Recommendations for Other Services: Neuropsych consult Patient destination: Home Follow up Recommendations: 24 hour supervision/assistance;Outpatient SLP Equipment Recommended: None recommended by SLP    SLP Frequency 3 to 5 out of 7 days   SLP Duration  SLP Intensity  SLP Treatment/Interventions 3-5 days   Minumum of 1-2 x/day, 30 to 90 minutes  Cognitive remediation/compensation;Internal/external aids;Cueing hierarchy;Environmental controls;Therapeutic Activities;Patient/family education;Functional tasks    Pain No/Denies Pain  Prior Functioning Type of Home: House  Lives With: Daughter Available Help at Discharge: Family;Available PRN/intermittently  Short Term Goals: Week 1: SLP Short Term Goal 1 (Week 1): STGs=LTGs due to short length of stay   Refer to Care Plan for Long Term Goals  Recommendations for other services: Neuropsych  Discharge Criteria: Patient will be discharged from SLP if patient refuses treatment 3 consecutive times without medical reason, if treatment goals not met, if there is a change in medical status, if patient makes no progress towards goals or if patient is discharged from hospital.  The above assessment, treatment plan, treatment alternatives and goals were discussed and mutually agreed upon: by patient  Reginald Weida 01/02/2018, 3:19 PM

## 2018-01-02 NOTE — Discharge Instructions (Signed)
Inpatient Rehab Discharge Instructions  ACSA ESTEY Discharge date and time: No discharge date for patient encounter.   Activities/Precautions/ Functional Status: Activity: activity as tolerated Diet: diabetic diet Wound Care: none needed Functional status:  ___ No restrictions     ___ Walk up steps independently ___ 24/7 supervision/assistance   ___ Walk up steps with assistance ___ Intermittent supervision/assistance  ___ Bathe/dress independently ___ Walk with walker     _x__ Bathe/dress with assistance ___ Walk Independently    ___ Shower independently ___ Walk with assistance    ___ Shower with assistance ___ No alcohol     ___ Return to work/school ________  Special Instructions:  No driving  My questions have been answered and I understand these instructions. I will adhere to these goals and the provided educational materials after my discharge from the hospital.  Patient/Caregiver Signature _______________________________ Date __________  Clinician Signature _______________________________________ Date __________  Please bring this form and your medication list with you to all your follow-up doctor's appointments.

## 2018-01-02 NOTE — Plan of Care (Signed)
  Problem: Consults Goal: RH GENERAL PATIENT EDUCATION Description See Patient Education module for education specifics. Outcome: Progressing   Problem: RH SAFETY Goal: RH STG ADHERE TO SAFETY PRECAUTIONS W/ASSISTANCE/DEVICE Description STG Adhere to Safety Precautions With Mod I Assistance/Device.  Outcome: Progressing   Problem: RH PAIN MANAGEMENT Goal: RH STG PAIN MANAGED AT OR BELOW PT'S PAIN GOAL Description < 3 out of 10.   Outcome: Progressing

## 2018-01-02 NOTE — Evaluation (Addendum)
Physical Therapy Assessment and Plan  Patient Details  Name: Bridget Anderson MRN: [id number] Date of Birth: 1970/04/27  PT Diagnosis: Difficulty walking and Muscle weakness Rehab Potential: Excellent ELOS: 3 days   Today's Date: 01/02/2018 PT Individual Time: 1030-1130 PT Individual Time Calculation (min): 60 min    Problem List:  Patient Active Problem List   Diagnosis Date Noted  . Postconcussive syndrome 01/01/2018  . Asthma   . Essential hypertension   . Diabetes mellitus type 2 in obese ([place])   . Dyslipidemia   . Vascular headache   . Chronic post-traumatic headache, not intractable 11/20/2017  . Post concussion syndrome 11/20/2017  . Shortness of breath on exertion 05/25/2017  . Other fatigue 05/25/2017  . Type 2 diabetes mellitus without complication, without long-term current use of insulin ([place]) 05/25/2017  . Uncontrolled hypertension 05/25/2017  . Chronic migraine without aura, with intractable migraine, so stated, with status migrainosus 03/20/2017  . S/P hysterectomy 07/16/2013    Past Medical History:  Past Medical History:  Diagnosis Date  . Chlamydia   . Diabetes ([place])   . GERD (gastroesophageal reflux disease)   . Hyperlipidemia   . Hypertension   . Migraine   . Obstructive sleep apnea on CPAP    pt has not worn CPAP in years  . Seasonal allergies   . Wears glasses    Past Surgical History:  Past Surgical History:  Procedure Laterality Date  . ABDOMINAL HYSTERECTOMY N/A 07/16/2013   Procedure: HYSTERECTOMY ABDOMINAL;  Surgeon: Allyn Kenner, DO;  Location: [facility];  Service: Gynecology;  Laterality: N/A;  . BILATERAL SALPINGECTOMY Bilateral 07/16/2013   Procedure: BILATERAL SALPINGECTOMY;  Surgeon: Allyn Kenner, DO;  Location: [facility];  Service: Gynecology;  Laterality: Bilateral;  . CARPAL TUNNEL RELEASE  01/16/2012   Procedure: CARPAL TUNNEL RELEASE;  Surgeon: Nita Sells, MD;  Location: [facility];  Service:  Orthopedics;  Laterality: Left;  . CARPAL TUNNEL RELEASE  02/20/2012   Procedure: CARPAL TUNNEL RELEASE;  Surgeon: Nita Sells, MD;  Location: [facility];  Service: Orthopedics;  Laterality: Right;  . COLONOSCOPY    . CYSTO WITH HYDRODISTENSION N/A 07/16/2013   Procedure: CYSTOSCOPY/HYDRODISTENSION;  Surgeon: Reece Packer, MD;  Location: [facility];  Service: Urology;  Laterality: N/A;  . DILATION AND CURETTAGE OF UTERUS    . FOOT SURGERY    . HYSTEROSCOPY W/D&C  10/31/2011   Procedure: DILATATION AND CURETTAGE /HYSTEROSCOPY;  Surgeon: Allyn Kenner, DO;  Location: [facility];  Service: Gynecology;  Laterality: N/A;  . LAPAROSCOPY N/A 07/16/2013   Procedure: LAPAROSCOPY DIAGNOSTIC;  Surgeon: Allyn Kenner, DO;  Location: [facility];  Service: Gynecology;  Laterality: N/A;  . PLANTAR FASCIA SURGERY    . PUBOVAGINAL SLING N/A 07/16/2013   Procedure: Gaynelle Arabian;  Surgeon: Reece Packer, MD;  Location: [facility];  Service: Urology;  Laterality: N/A;  . TUBAL LIGATION    . VAGINAL DELIVERY  1995,  2003    Assessment & Plan Clinical Impression: Patient is a 47 y.o. year old female who was involved in a work-related accident at [place] 08/05/2017 where an 48 lb pipe  fell and struck her in the head. She initially presented to [facility]  after the accident complaining of nausea and vertigo with headache. She could not recall full events of the accident. CT of the head reviewed, unremarkable for acute intracranial process.  She was treated for a scalp contusion and then released. There was  no reported loss of consciousness but claims to have transient loss of vision and hearing. She again presented to [facility] on 08/08/2017 with increased headache, nausea, dizziness and blurred vision. She was also having ringing in her ears. She did not any focal weakness. CTA of the neck negative for dissection.she was again released to home given a  trial of antiemetic. Patient noted persistent headaches as well as bouts of memory loss. Patient has been followed at the outpatient rehabilitation center for postconcussion syndrome and also plan for neuropsychological workup. She was attending outpatient therapies noted some elevated blood pressure 202/122. There had been initial plans for a follow-up MRI however after most recent follow-up with outpatient rehabilitation services it was recommended patient would benefit from an inpatient rehabilitation comprehensive program..  Patient was admitted to [facility] on 01/01/2018 .   Patient currently requires independent  with mobility, presents with pain in neck, Lt knee and Lt shoulder.  Prior to hospitalization, patient was independent  with mobility and lived with Daughter in a House home.  Home access is  Level entry.  Patient will benefit from skilled PT intervention to maximize safe functional mobility, minimize fall risk and decrease caregiver burden for planned discharge home with intermittent assist.  Anticipate patient will benefit from follow up OP at discharge.  PT - End of Session Activity Tolerance: Tolerates 30+ min activity with multiple rests Endurance Deficit: Yes Endurance Deficit Description: BP is a concern with activity: before activity started 193/ 105 PT Assessment Rehab Potential (ACUTE/IP ONLY): Excellent PT Patient demonstrates impairments in the following area(s): Motor;Endurance;Pain PT Locomotion Functional Problem(s): Stairs PT Plan PT Intensity: Minimum of 1-2 x/day ,45 to 90 minutes PT Frequency: 5 out of 7 days PT Duration Estimated Length of Stay: 3 days PT Treatment/Interventions: Training and development officer;Ambulation/gait training;Therapeutic Exercise;Community reintegration;Discharge planning;Splinting/orthotics;Neuromuscular re-education;UE/LE Coordination activities;UE/LE Strength taining/ROM;Cognitive remediation/compensation;Functional electrical  stimulation;Therapeutic Activities;DME/adaptive equipment instruction;Patient/family education;Pain management;Stair training PT Transfers Anticipated Outcome(s): indpendent PT Locomotion Anticipated Outcome(s): independent PT Recommendation Follow Up Recommendations: Outpatient PT Equipment Recommended: None recommended by PT  Skilled Therapeutic Intervention  Pt participated in skilled PT eval and was educated on PT POC and goals. Pt independent with balance, functional mobility and gait. Pt with no LOB during session and no c/o dizziness.  Pt with BP 155/101 on eval which is within treatable parameters.  Pt participated in orthopedic evaluation for c/o knee, shoulder and neck pain. Pt with pain with resisted knee ext, patellar mobs, Lt shoulder abduction, resisted elbow extension.  Trigger points Lt upper trap, levator and rhomboids. Pt performed HEP and was given handout for HEP to improve strength and decrease pain.  Pt left in room with all needs at hand.  PT Evaluation Precautions/Restrictions Precautions Precaution Comments: congitive deficts - memory  Restrictions Weight Bearing Restrictions: No General  BP 155/101  Pain Pain Assessment Pain Scale: (reports ongoing discomfort in head) Pain Score: 5  Pain Type: Chronic pain Pain Location: Knee Pain Orientation: Left Pain Descriptors / Indicators: Aching Pain Onset: With Activity Pain Intervention(s): Repositioned;Rest Home Living/Prior Functioning Home Living Available Help at Discharge: Family;Available PRN/intermittently Type of Home: House Home Access: Level entry Home Layout: Two level;Able to live on main level with bedroom/bathroom Alternate Level Stairs-Number of Steps: Flight Bathroom Shower/Tub: Tub/shower unit;Curtain  Lives With: Daughter Prior Function Level of Independence: Independent with basic ADLs;Independent with transfers;Independent with gait  Able to Take Stairs?: Yes Driving:  Yes Vision/Perception  Vision - Assessment Additional Comments: reports no changes and functionally no  difficulty Perception Perception: Within Functional Limits Praxis Praxis: Intact  Cognition Overall Cognitive Status: Impaired/Different from baseline Arousal/Alertness: Awake/alert Attention: Selective Selective Attention: Appears intact Memory: Impaired Memory Impairment: Decreased recall of new information;Decreased short term memory Decreased Short Term Memory: Functional basic;Verbal basic Awareness: Impaired Awareness Impairment: Anticipatory impairment Problem Solving: Impaired Problem Solving Impairment: Functional basic Executive Function: Self Correcting;Decision Making;Organizing Organizing: Impaired Organizing Impairment: Functional basic;Verbal complex Decision Making: Impaired Decision Making Impairment: Functional basic;Verbal complex Self Correcting: Impaired Self Correcting Impairment: Functional basic;Verbal complex Behaviors: Poor frustration tolerance Safety/Judgment: Appears intact Comments: impaired in the context of managing IADLs safely; overestimates her abilities [employer] Scales of Cognitive Functioning: Purposeful/appropriate Sensation Sensation Light Touch: Appears Intact Hot/Cold: Appears Intact Proprioception: Appears Intact Stereognosis: Appears Intact Coordination Gross Motor Movements are Fluid and Coordinated: Yes Fine Motor Movements are Fluid and Coordinated: Yes Motor  Motor Motor: Within Functional Limits Motor - Skilled Clinical Observations: Lt LE and shoulder weakness  Mobility Transfers Transfers: Sit to Stand;Stand Pivot Transfers Sit to Stand: Independent Stand Pivot Transfers: Independent Transfer (Assistive device): None Locomotion  Gait Ambulation: Yes Gait Assistance: Independent Assistive device: None Stairs / Additional Locomotion Stairs: Yes Stairs Assistance: Independent Ramp:  Sales executive Mobility: No  Trunk/Postural Assessment  Cervical Assessment Cervical Assessment: (fwd head) Thoracic Assessment Thoracic Assessment: (mild kyphosis) Lumbar Assessment Lumbar Assessment: Within Functional Limits Postural Control Postural Control: Within Functional Limits  Balance Balance Balance Assessed: Yes Dynamic Standing Balance Dynamic Standing - Balance Support: During functional activity Dynamic Standing - Level of Assistance: 7: Independent Dynamic Standing - Comments: during ADL tasks - standing to perform one limb standing Extremity Assessment  RUE Assessment RUE Assessment: Within Functional Limits LUE Assessment LUE Assessment: Within Functional Limits RLE Assessment General Strength Comments: hip flex 3/5, hip abd 4-/5, knee flex 4/5, knee ext 4/5 LLE Assessment General Strength Comments: hip flex 3/5, knee flex 3/5, knee ext 3/5, hip abd 4-/5    Refer to Care Plan for Long Term Goals  Recommendations for other services: Neuropsych  Discharge Criteria: Patient will be discharged from PT if patient refuses treatment 3 consecutive times without medical reason, if treatment goals not met, if there is a change in medical status, if patient makes no progress towards goals or if patient is discharged from hospital.  The above assessment, treatment plan, treatment alternatives and goals were discussed and mutually agreed upon: by patient  [facility] 01/02/2018, 11:41 AM

## 2018-01-03 ENCOUNTER — Inpatient Hospital Stay (HOSPITAL_COMMUNITY): Payer: Self-pay | Admitting: Physical Therapy

## 2018-01-03 ENCOUNTER — Encounter (HOSPITAL_COMMUNITY): Payer: Self-pay | Admitting: Psychology

## 2018-01-03 ENCOUNTER — Inpatient Hospital Stay (HOSPITAL_COMMUNITY): Payer: Self-pay | Admitting: Speech Pathology

## 2018-01-03 ENCOUNTER — Inpatient Hospital Stay (HOSPITAL_COMMUNITY): Payer: Self-pay | Admitting: Occupational Therapy

## 2018-01-03 NOTE — Progress Notes (Signed)
[facility] PHYSICAL MEDICINE & REHABILITATION PROGRESS NOTE   Subjective/Complaints: Pt up in bed. Dressed herself. Ate breakfast. Denies headache today. Doesn't recall who she saw yesterday in therapy. Doesn't remember me. Asks when she can go back to work  ROS: Patient denies fever, rash, sore throat, blurred vision, nausea, vomiting, diarrhea, cough, shortness of breath or chest pain,   or mood change.    Objective:   No results found. Recent Labs    01/02/18 0451  WBC 9.8  HGB 11.6*  HCT 39.1  PLT 324   Recent Labs    01/02/18 0451  NA 135  K 3.9  CL 105  CO2 26  GLUCOSE 198*  BUN 12  CREATININE 0.84  CALCIUM 8.7*    Intake/Output Summary (Last 24 hours) at 01/03/2018 0838 Last data filed at 01/02/2018 2156 Gross per 24 hour  Intake 660 ml  Output -  Net 660 ml     Physical Exam: Vital Signs Blood pressure 129/79, pulse 64, temperature 98.2 F (36.8 C), temperature source Oral, resp. rate 18, weight 99.3 kg, last menstrual period 06/27/2013, SpO2 98 %.  Constitutional: No distress . Vital signs reviewed. HEENT: EOMI, oral membranes moist Neck: supple Cardiovascular: RRR without murmur. No JVD    Respiratory: CTA Bilaterally without wheezes or rales. Normal effort    GI: BS +, non-tender, non-distended  Extremities: No clubbing, cyanosis, or edema. Pulses are 2+ Skin: Clean and intact without signs of breakdown Neuro: no short term recall. Tells me where her sister is and where she works.  Cranial nerves 2-12 are intact. Sensory exam is normal. Reflexes are 2+ in all 4's. Fine motor coordination is intact. No tremors. Motor function is grossly 5/5.  Musculoskeletal: Full ROM, No pain with AROM or PROM in the neck, trunk, or extremities. Posture appropriate Psych: affect is anxious      Assessment/Plan: 1. Functional deficits secondary to post-concussion syndrome, ongoing cognitive deficits which require 3+ hours per day of interdisciplinary therapy in  a comprehensive inpatient rehab setting.  Physiatrist is providing close team supervision and 24 hour management of active medical problems listed below.  Physiatrist and rehab team continue to assess barriers to discharge/monitor patient progress toward functional and medical goals  Care Tool:  Bathing    Body parts bathed by patient: Right arm, Left arm, Chest, Abdomen, Front perineal area, Buttocks, Right upper leg, Left upper leg, Right lower leg, Left lower leg, Face         Bathing assist Assist Level: Independent     Upper Body Dressing/Undressing Upper body dressing   What is the patient wearing?: Pull over shirt    Upper body assist Assist Level: Independent    Lower Body Dressing/Undressing Lower body dressing      What is the patient wearing?: Pants, Underwear/pull up     Lower body assist Assist for lower body dressing: Independent     Toileting Toileting    Toileting assist Assist for toileting: Independent     Transfers Chair/bed transfer  Transfers assist     Chair/bed transfer assist level: Independent     Locomotion Ambulation   Ambulation assist      Assist level: Independent       Walk 10 feet activity   Assist     Assist level: Independent     Walk 50 feet activity   Assist    Assist level: Independent      Walk 150 feet activity   Assist  Assist level: Independent      Walk 10 feet on uneven surface  activity   Assist     Assist level: Independent     Wheelchair     Assist Will patient use wheelchair at discharge?: No             Wheelchair 50 feet with 2 turns activity    Assist            Wheelchair 150 feet activity     Assist            Medical Problem List and Plan: 1.  Memory loss, headaches, tinnitus, vestibular symptoms, insomnia and emotional liability secondary to postconcussion syndrome  -ongoing goals of this admission are to assess in depth  patient's ongoing memory deficits and executive dysfunction in addition to the contribution of any non-organic components to her clinical picture.   -she suffered a relatively mild brain injury 5 months ago. Most recent MRI performed two weeks ago does not reveal any abnormalities. I have not personally reviewed this study as it was done through [facility] so that she could have an open MRI  -SLP assessment ongoing--substantial cognitive deficits seen yesterday.   -Neuropsych assessment ongoing/ anterograde amnesia  -PT: no vestibular sx  -OT: iADL, cognitive integration in more complex tasks  -added ritalin for attention---may help with memory  -consider aricept trial 2.  DVT Prophylaxis/Anticoagulation: ambulatory 3. Pain Management: butalbital one capsule by mouth every 6 hours as needed for headaches 4. Mood:  Pamelor 20 mg bedtime   5. Neuropsych: This patient is capable of making decisions on her own behalf. 6. Skin/Wound Care: Routine skin checks 7. Fluids/Electrolytes/Nutrition:  encourage PO  -eating well.    8. Hyperlipidemia. Lovastatin 9. Diabetes mellitus. Metformin 500 mg daily 10. Hypertension. Benazepril 40 mg twice daily,metoprolol 50 mg twice daily  -increased metoprolol to 75mg  bid---improved control thus far 11. Asthma. Symbicort/pro-air    LOS: 2 days A FACE TO FACE EVALUATION WAS PERFORMED  Meredith Staggers 01/03/2018, 8:38 AM

## 2018-01-03 NOTE — Patient Care Conference (Signed)
Inpatient RehabilitationTeam Conference and Plan of Care Update Date: 01/02/2018   Time: 2:55 PM    Patient Name: Bridget Anderson      Medical Record Number: [id number]  Date of Birth: [birth year] Sex: Female         Room/Bed: 4W03C/4W03C-01 Payor Info: Payor: [employer] / Plan: [employer] / Product Type: *No Product type* /    Admitting Diagnosis: head injury, post Concussive  Admit Date/Time:  01/01/2018  3:03 PM Admission Comments: No comment available   Primary Diagnosis:  <principal problem not specified> Principal Problem: <principal problem not specified>  Patient Active Problem List   Diagnosis Date Noted  . Anterograde amnesia   . Postconcussive syndrome 01/01/2018  . Asthma   . Essential hypertension   . Diabetes mellitus type 2 in obese ([place])   . Dyslipidemia   . Vascular headache   . Chronic post-traumatic headache, not intractable 11/20/2017  . Post concussion syndrome 11/20/2017  . Shortness of breath on exertion 05/25/2017  . Other fatigue 05/25/2017  . Type 2 diabetes mellitus without complication, without long-term current use of insulin ([place]) 05/25/2017  . Uncontrolled hypertension 05/25/2017  . Chronic migraine without aura, with intractable migraine, so stated, with status migrainosus 03/20/2017  . S/P hysterectomy 07/16/2013    Expected Discharge Date: Expected Discharge Date: 01/05/18  Team Members Present: Physician leading conference: Dr. Alger Simons Social Worker Present: Lennart Pall, LCSW Nurse Present: Isla Pence, RN PT Present: Roderic Ovens, PT OT Present: Willeen Cass, OT;Other (comment)(Sandra [place], [place]) SLP Present: Weston Anna, SLP PPS Coordinator present : Ileana Ladd, PT     Current Status/Progress Goal Weekly Team Focus  Medical   post concussion syndrome, ongoing cognitive deficits, headache/vestibular dysfunction, uncontrolled HTN  improve memory and executive dysfunction  cognitive behavioral assessment, bp  control   Bowel/Bladder   Patient is Continent of Bowel and bladder, independent with toileting  To maintain continence and independence of B/B  Assist patient with toileting needs prn   Swallow/Nutrition/ Hydration             ADL's   mod to max for cognition with IADLs  cognitive goals set at min A   compensation technques for memory, testing and assessment for executive cognition   Mobility   indpendent with mobility, decreased LE strength, neck and shoulder pain  improve LE strength  strength, HEP   Communication             Safety/Cognition/ Behavioral Observations  Mod A   Overall Max A  problem solving, use of memory strategies    Pain   Patient frequently complains of headaches, received prn fiorcet at hs.   Continue to assess and address pain as needed.   Pain <2   Skin   No skin issues noted.   Maintain skin integrity.   Assess and address any skin issues as needed.     Rehab Goals Patient on target to meet rehab goals: Yes *See Care Plan and progress notes for long and short-term goals.     Barriers to Discharge  Current Status/Progress Possible Resolutions Date Resolved   Physician                    Nursing                  PT                    OT  SLP                SW                Discharge Planning/Teaching Needs:  Plan to return home with family able to provide intermittent assistance.  Teaching needs TBD   Team Discussion:  New eval of pt admitted from home for full evaluation to include neuropsychology assessment.  Need to determine if any behavioral component to presentation, symptoms.  Per ST testing, pt with mod - severe cognitive impairments.  Independent motorically;  C/o neck and shoulder pain.  MD may consider stimulant.  Short LOS.  Revisions to Treatment Plan:  None    Continued Need for Acute Rehabilitation Level of Care: The patient requires daily medical management by a physician with specialized training in  physical medicine and rehabilitation for the following conditions: Daily direction of a multidisciplinary physical rehabilitation program to ensure safe treatment while eliciting the highest outcome that is of practical value to the patient.: Yes Daily medical management of patient stability for increased activity during participation in an intensive rehabilitation regime.: Yes Daily analysis of laboratory values and/or radiology reports with any subsequent need for medication adjustment of medical intervention for : Neurological problems   I attest that I was present, lead the team conference, and concur with the assessment and plan of the team.   Nakota Elsen 01/03/2018, 11:00 AM

## 2018-01-03 NOTE — Progress Notes (Signed)
Speech Language Pathology Daily Session Note  Patient Details  Name: Bridget Anderson MRN: [id number] Date of Birth: August 19, 1970  Today's Date: 01/03/2018 SLP Individual Time: 1400-1500 SLP Individual Time Calculation (min): 60 min  Short Term Goals: Week 1: SLP Short Term Goal 1 (Week 1): STGs=LTGs due to short length of stay   Skilled Therapeutic Interventions: Skilled treatment session focused on cognitive goals. SLP facilitated session by taking the patient to the gift shop to focus on executive functioning and recall with use of strategies. Prior to leaving the room, patient independently located her shoes (in corner behind recliner) and a pen in her purse that was in the second drawer of her nightstand. Patient also independently utilized the memory strategy of writing the 5 items down she needed to locate while at the gift shop to maximize carryover. Patient utilized maps for path finding with extra time and overall Mod A verbal and visual cues.  While in the gift shop, patient required overall Mod A verbal cues for utilization of list to recall items but independently located all items with extra time. Once patient was back in her room, she independently recalled 1 item without use of the list for recall. Patient appeared much more engaged and brighter this session compared to yesterday's session. Patient left upright in recliner with all needs within reach. Continue with current plan of care.      Pain Pain Assessment Pain Scale: 0-10 Pain Score: 0-No pain  Therapy/Group: Individual Therapy  Zaine Elsass 01/03/2018, 3:26 PM

## 2018-01-03 NOTE — Plan of Care (Signed)
OT goal: Pt will be able to demonstrate basic functional problem solving with external/ environmental cues with supervision

## 2018-01-03 NOTE — Consult Note (Signed)
Patient:  Bridget Anderson   DOB: Dec 05, 1970  MR Number: [id number]  Location: [facility] [id number] [place] [address] Dept: [id number] Loc: [phone]  Start: 8 AM End: 10:30 AM  Provider/Observer:     Edgardo Roys, PsyD   Reason For Service:     Rhys Anchondo is a 47 year old right-handed female with a history of hypertension, migraine headaches, and diabetes.  The patient is reported to have been involved in a work-related accident on 08/05/2017 where an 85 pound pipe fell and struck the patient on the head.  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  The patient reports that she remembers this happening or most of what happened.  The patient reports that "I can still remember the feeling of it hitting me" and reports that she could "feel it in her teeth."  The patient reports that initially she could not see or hear anything but oriented to self leaning over the pipe and was in the area by herself.  The patient reports that she called someone on her cell phone to come and assist.  The patient reports that she does not think she had a full loss of consciousness but there was an altered consciousness.  When team members came down they ended up driving her to the hospital.  The patient reports that she remembers being in a great deal of pain and remembers the events in the ED and that she was seen in the emergency department for a long time.  The patient reports that she remembers the night at home after it happened quite clearly but when she woke up in the morning everything is far as memory and recall was fuzzy for any new event and she is continued to have anterior grade amnesia from the morning after the accident continuing to this day.  The patient reports that she remembers all of her old memories quite well.  There are  some minor word finding issues.  The patient does have a history of migraines and hypertension.  During her emergency department presentation at [facility] she was complaining of nausea and vertigo with headache.  She acknowledged that she did not recall the full events of the accident.  CT scan of the head was reviewed from that time and was unremarkable for intracranial process.  She was treated for a scalp contusion and then released.  There was only transient loss of vision and hearing and no complete loss of consciousness.  The patient again presented to the emergency department on 08/08/2017 with increased headache, nausea, dizziness and blurred vision.  She was also experiencing ringing in her ears.  The patient has continued to report persistent headaches as well as bouts of memory loss.  The patient describes her memory difficulties related to any new event.  She reports that she will meet with 1 of her doctors and be able to follow along and remember the conversation while it is active.  However, once the interaction with others have been completed she quickly loses memory of what is been happening or what has transpired.  The patient has trouble remembering any new event but is well oriented and with good executive functioning during the event itself.  The patient did have an MRI of her head without contrast on 12/19/2017 through [facility].  The impressions  of this MRI showed no acute findings and there were no indications of acute or remote evidence of intracranial hemorrhage.  Testing Administered:  The patient had extensive further clinical interview and mental status/neurobehavioral interview else being administered the RBANS A AS WELL as assessing issues related to validity of her symptoms and deficits.  Participation Level:   Active  Participation Quality:  Appropriate and Redirectable      Behavioral Observation:  Well Groomed, Alert, and Appropriate.   Test  Results:    Qualitative Description of RBANS Index Scores        Total Index Score: 70  The patient produced a total index score of 70 which is a composite of all indices within the battery.  This performance fell at the bottom end of the borderline range of functioning and is clearly indicative of significant general cognitive impairments.  However, there was extreme variability within various neuropsychological components with some elements falling in the average range including visual spatial abilities and language abilities with other deficits in the severely impaired range related to memory.     Immediate Memory Index: 49  The patient produced an immediate memory index score of 49 which falls in the extremely low range of functioning.  This measure assesses initial encoding and learning of complex and simple verbal information.  The patient clearly shows great difficulty with learning new information and being able to encode and store that information initially.  This is severely impaired score.  Immediate memory deficits and range will impact all areas of functioning and make issues such as managing her's medications or managing finances or remembering appointments very difficult without assistance.        Visuospatial / Constructional Index: 100  The patient produced a visual spatial/constructional index score of 100 which falls in the average range.  This performance suggests that she is function the average range with no indications of cognitive deficits with regard to her ability to process and use visual-spatial        Attention Index: 64  The patient also produced a significantly impaired performance with regard to attention and concentration measures.  The patient had significant difficulties with simple auditory registration and encoding as well as slowed visual scanning and visual processing speed (focus execute task).  This level of information processing speed and attention will  have a significant negative effect on all ADLs.      Language Indes: 95  The patient produced a language index score of 95 which falls in the average range.  This is a measure of excess language functioning.  The patient showed good ability to express herself clearly and showed good verbal fluency as well as word finding abilities and naming abilities.  There were no indications of any expressive language deficits.     Delayed Memory Index: 40  The patient displayed severe and significant deficits with regard to her delayed memory index.  This is clearly not surprising as the patient had severe deficits with regard to both initially encoding information as well as her immediate recall of information.  With such severe immediate recall of information it would be extremely typical to have severe deficits with regard to delayed memory as well.  Furthermore, there were several measures that make up this delayed memory index score that were looking at acute memory recall where the patient was placed in recall events where she either had to respond yes or no to whether she thought an item was part of the information that  she had previously learned or been presented.  This type of acute recall testing takes away some of the issues related to free recall and frontal lobe functioning.  It essentially is a 50-50 chance measure.  This type of measure can also give an indirect estimation of any possible purposeful attempts to present oneself as having greater deficits then are present.  On 2 separate measures of acute recall with 50-50 chance of getting the correct the patient essentially performed between 50 and 60% correct responses.  This type of pattern is not consistent with a person trying to fake, embellish, or exaggerate memory deficits.  Someone who has 0 memory functioning should perform around 50% on these tasks as far as correct versus and correct responses.  If a person attempts to exaggerate memory  performance but is actually recalling her remembering correct or incorrect responses they will often perform well below 50% on this measure.  Someone who has only mild memory impairments will be correct 70 or 80% of the time.  The patient's performance is consistent with severe deficits with regard to memory but is not consistent with any attempts to exaggerate the appearance of deficits.  Therefore these patterns are not consistent with malingering type of symptoms.  Summary of Results:   Overall, the results of the this repeatable neuropsychological test battery for the first administration were consistent with the patient's clinical descriptions of her difficulties as well as the descriptions of her family members.  The patient displayed clear severe deficits with regard to auditory encoding and attentional abilities, information processing speed, and both immediate and delayed memory.  This is her for both visual and auditory information.  The patient did appear to try her hardest throughout these measures and there was no indication from an observational standpoint of attempts to minimize or exaggerate any type of difficulties or deficits.  The patient had no indication of impairments with regard to visual spatial or visual constructional abilities or receptive or expressive language ability.  These measures were in the average range and were consistent with estimations of premorbid functioning.  Impression/Diagnosis:   The results of the initial neuropsychological assessment are consistent with average/non-impaired functioning with regard to visual spatial and visual constructional abilities as well as language abilities.  Moderate deficits were noted with regard to attention and encoding abilities and severe and profound deficits were found for both immediate memory as well as delayed memory.  These memory deficits were true for both auditory as well as visual memory and learning.  The patient was  administered 2 types of cued memory tests that essentially have a 50-50 probability of correct responses when a person has no memory or recall at all.  When a person performs significantly below 50% on these measures it is strongly suggestive of purposeful attempts to exaggerate memory deficits.  When a person has only mild memory deficits they tend to perform somewhere around the 80% mark.  The patient's actual performance fell between 50% and 60% on these 50-50 tasks suggesting a consistent pattern with the level of severe memory deficits but not suggest attempt to exaggerate or fake impairments.  Her overall pattern of strengths and weaknesses on neuropsychological functioning are extremely consistent with the patient's description of her deficits and difficulties as well as the descriptions of her family members.  Objectively measured memory deficits, descriptions of her sister, and clinical observed during the extended clinical interviews with the patient, would not be consistent with those associated with diffuse axonal injury,  those of significant stroke or other widespread vascular event, or examples of malingering.  There are very few neurological explanations of the patient's symptoms beyond those typically seen with very focused subcortical and limbic system vascular events.  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  I was able to speak with the patient's sister for quite some time with the sister describing the patient's symptoms.  While the extreme nature of the sudden and abrupt emotional changes have dissipated for the most part, they do continue to be present.  Pseudobulbar affect has been associated with strokes or other subcortical events.  The type of memory deficits that the patient displays would also be limited to thalamic stroke and/or the pathways between the hypothalamus and thalamus in  general.  If this had occurred, it would not need to be a very large strokes and in fact would likely be very small and possibly hard to detect.  At this point, the patient has had an open MRI.  I have not been able to review the MRI but only the radiological report as that visit was done thru another hospital system.  There clearly was no indication of any widespread or significant stroke either acute or subacute.  I do think that it would be important initially to closely reviewed the open MRI with particular focus on these subcortical regions.  Depending on the quality of the image there may be in need to do a traditional closed MRI to further assess this possibility.  In any event, I do not think that this current presentation is related to purposeful or conscious attempts to fake bad or exaggerate symptoms.  The patient is clearly very distressed by her deficits and confused by what is going on.  The patient's overall mood is good and she is not in any type of depressive event and there is no indication of dissociative experiences or other psychiatric issues present.  Diagnosis:    Axis I:  This does not appear to be consistent with diffuse axonal injury and also it does not appear to be related to psychiatric illness or conscious attempts fake or exaggerate cognitive deficits.  I do think that we will need to look more closely at the possibility of very small focal vascular events around the thalamus and hypothalamus as well as white matter tracts between the thalamus and frontal regions and/or thalamus and cerebellum regions.   Ilean Skill, Psy.D. Neuropsychologist

## 2018-01-03 NOTE — Progress Notes (Addendum)
Physical Therapy Session Note  Patient Details  Name: Bridget Anderson MRN: [id number] Date of Birth: 07/26/1970  Today's Date: 01/03/2018 PT Individual Time: 1030-1130 PT Individual Time Calculation (min): 60 min   Short Term Goals: Week 1:  PT Short Term Goal 1 (Week 1): = LTG  Skilled Therapeutic Interventions/Progress Updates:    Patient received up in recliner, pleasant and willing to participate with PT however requiring Max cues for navigation on unit due to memory deficits today. Extensive education provided regarding post-concussive syndrome/mild TBI as well as limiting screen time and cognitive tasks if pain starts/when symptomatic, neural recovery process, PT interventions for neck and back pain, benefits of manual therapy, and HEP. Vestibular screen performed as she does report intermittent dizziness, limited by neck pain during VOR exam but no significant deficits noted, instructed patient to refer to vestibular PT if dizziness becomes more frequent or more intense. Discussed and practiced HEP including cervical ROM all planes, chin tucks, scap retractions, and cat/cow exercise routine, texted and emailed to patient in [employer] system. Finished with application of moist heat and STM to L cervical musculature and upper traps. She was able to ambulate back to her room with cues for navigation and was left standing at sink, all needs otherwise met this morning. 15 minutes of skilled therapy time missed due to being in session with neuropsych.   Therapy Documentation Precautions:  Precautions Precaution Comments: congitive deficts - memory  Restrictions Weight Bearing Restrictions: No General: PT Amount of Missed Time (min): 15 Minutes PT Missed Treatment Reason: Other (Comment)(with Neuropsych ) Vital Signs:   Pain: Pain Assessment Pain Scale: 0-10 Pain Score: 0-No pain    Therapy/Group: Individual Therapy    PT, DPT, CBIS  Supplemental Physical Therapist [facility]    Pager [phone] Acute Rehab Office [phone]   01/03/2018, 12:28 PM  

## 2018-01-03 NOTE — Progress Notes (Addendum)
Occupational Therapy Session Note  Patient Details  Name: Bridget Anderson MRN: [id number] Date of Birth: 1970/11/28  Today's Date: 01/03/2018 OT Individual Time: 1300-1400 OT Individual Time Calculation (min): 60 min     Skilled Therapeutic Interventions/Progress Updates:    1:1 Pt participated in the Engineer, agricultural Test (EFPT) today down in the ADL kitchen.  Pt did not have any recognition of this therapist from yesterday no was she oriented to the month, day or date. Pt did require more time to complete Bill task. The test score executive functioning in the area of initiation, organization, sequencing, judgement & safety and completion of task. Pt demonstrated deficits in organization, sequencing and judgement and safety. Test was also indicative of awareness problem not realizing she needing A in 3/4 tasks (requring verbal direct cues.).  Pt required min cues with external paper cue to locate room again.  Interesting note: Pt never forgets her phone in her room or to get it again before she leave treatment room. Pt also always checks self in mirror before leaves the room to look at her appearance. Does not need cue to locate shoes in room.    Therapy Documentation Precautions:  Precautions Precaution Comments: congitive deficts - memory  Restrictions Weight Bearing Restrictions: No Pain: Pain Assessment Pain Scale: 0-10 Pain Score: 0-No pain ADL: ADL Eating: Independent Grooming: Independent Upper Body Bathing: Independent Lower Body Bathing: Independent Upper Body Dressing: Independent Lower Body Dressing: Independent Toileting: Independent Toilet Transfer: Independent Toilet Transfer Method: Wellsite geologist Transfer: IT consultant Method: Ambulating Vision   Therapy/Group: Individual Therapy  Willeen [facility] 01/03/2018, 4:26 PM

## 2018-01-04 ENCOUNTER — Inpatient Hospital Stay (HOSPITAL_COMMUNITY): Payer: Self-pay | Admitting: Occupational Therapy

## 2018-01-04 ENCOUNTER — Inpatient Hospital Stay (HOSPITAL_COMMUNITY): Payer: Self-pay | Admitting: Speech Pathology

## 2018-01-04 ENCOUNTER — Inpatient Hospital Stay (HOSPITAL_COMMUNITY): Payer: No Typology Code available for payment source

## 2018-01-04 ENCOUNTER — Inpatient Hospital Stay (HOSPITAL_COMMUNITY): Payer: Self-pay | Admitting: Physical Therapy

## 2018-01-04 MED ORDER — DONEPEZIL HCL 10 MG PO TABS
5.0000 mg | ORAL_TABLET | Freq: Every day | ORAL | Status: DC
  Administered 2018-01-04: 5 mg via ORAL
  Filled 2018-01-04: qty 1

## 2018-01-04 MED ORDER — METHYLPHENIDATE HCL 5 MG PO TABS
10.0000 mg | ORAL_TABLET | Freq: Two times a day (BID) | ORAL | Status: DC
  Administered 2018-01-05 (×2): 10 mg via ORAL
  Filled 2018-01-04 (×2): qty 2

## 2018-01-04 NOTE — Progress Notes (Signed)
Social Work  Social Work Assessment and Plan  Patient Details  Name: Bridget Anderson MRN: [id number] Date of Birth: May 08, 1970  Today's Date: 01/04/2018  Problem List:  Patient Active Problem List   Diagnosis Date Noted  . Anterograde amnesia   . Postconcussive syndrome 01/01/2018  . Asthma   . Essential hypertension   . Diabetes mellitus type 2 in obese ([place])   . Dyslipidemia   . Vascular headache   . Chronic post-traumatic headache, not intractable 11/20/2017  . Post concussion syndrome 11/20/2017  . Shortness of breath on exertion 05/25/2017  . Other fatigue 05/25/2017  . Type 2 diabetes mellitus without complication, without long-term current use of insulin ([place]) 05/25/2017  . Uncontrolled hypertension 05/25/2017  . Chronic migraine without aura, with intractable migraine, so stated, with status migrainosus 03/20/2017  . S/P hysterectomy 07/16/2013   Past Medical History:  Past Medical History:  Diagnosis Date  . Chlamydia   . Diabetes ([place])   . GERD (gastroesophageal reflux disease)   . Hyperlipidemia   . Hypertension   . Migraine   . Obstructive sleep apnea on CPAP    pt has not worn CPAP in years  . Seasonal allergies   . Wears glasses    Past Surgical History:  Past Surgical History:  Procedure Laterality Date  . ABDOMINAL HYSTERECTOMY N/A 07/16/2013   Procedure: HYSTERECTOMY ABDOMINAL;  Surgeon: Allyn Kenner, DO;  Location: [facility];  Service: Gynecology;  Laterality: N/A;  . BILATERAL SALPINGECTOMY Bilateral 07/16/2013   Procedure: BILATERAL SALPINGECTOMY;  Surgeon: Allyn Kenner, DO;  Location: [facility];  Service: Gynecology;  Laterality: Bilateral;  . CARPAL TUNNEL RELEASE  01/16/2012   Procedure: CARPAL TUNNEL RELEASE;  Surgeon: Nita Sells, MD;  Location: [facility];  Service: Orthopedics;  Laterality: Left;  . CARPAL TUNNEL RELEASE  02/20/2012   Procedure: CARPAL TUNNEL RELEASE;  Surgeon: Nita Sells, MD;  Location:  [place] [facility];  Service: Orthopedics;  Laterality: Right;  . COLONOSCOPY    . CYSTO WITH HYDRODISTENSION N/A 07/16/2013   Procedure: CYSTOSCOPY/HYDRODISTENSION;  Surgeon: Reece Packer, MD;  Location: [facility];  Service: Urology;  Laterality: N/A;  . DILATION AND CURETTAGE OF UTERUS    . FOOT SURGERY    . HYSTEROSCOPY W/D&C  10/31/2011   Procedure: DILATATION AND CURETTAGE /HYSTEROSCOPY;  Surgeon: Allyn Kenner, DO;  Location: [facility];  Service: Gynecology;  Laterality: N/A;  . LAPAROSCOPY N/A 07/16/2013   Procedure: LAPAROSCOPY DIAGNOSTIC;  Surgeon: Allyn Kenner, DO;  Location: [facility];  Service: Gynecology;  Laterality: N/A;  . PLANTAR FASCIA SURGERY    . PUBOVAGINAL SLING N/A 07/16/2013   Procedure: Gaynelle Arabian;  Surgeon: Reece Packer, MD;  Location: [facility];  Service: Urology;  Laterality: N/A;  . TUBAL LIGATION    . VAGINAL DELIVERY  1995,  2003   Social History:  reports that she has never smoked. She has never used smokeless tobacco. She reports that she drinks alcohol. She reports that she does not use drugs.  Family / Support Systems Marital Status: Single Patient Roles: Parent Children: one daughter in college;  34 yo daughter at home Other Supports: sister, Jackquline Denmark @ (C) [phone];  mother, Juliene Kirsh @ [phone] Anticipated Caregiver: Sister and family Ability/Limitations of Caregiver: Sister works 3rd shift.  Mom and dad check in every 3-4 hours. Caregiver Availability: Intermittent Family Dynamics: Pt notes family is very supportive, however, no one available to provide 24/7 support.  Social History Preferred  language: [place] Religion: Baptist Cultural Background: NA Read: Yes Write: Yes Employment Status: Employed Name of Employer: [employer] Return to Work Plans: TBD Freight forwarder Issues: this is currently a worker's comp case Guardian/Conservator: None - per MD, pt capable of making decisions on her own behalf.    Abuse/Neglect Abuse/Neglect Assessment Can Be Completed: Yes Physical Abuse: Denies Verbal Abuse: Denies Sexual Abuse: Denies Exploitation of patient/patient's resources: Denies Self-Neglect: Denies  Emotional Status Pt's affect, behavior adn adjustment status: Pt pleasant, talkative and speaks openly about her frustration with her memory deficits and "not being in control of myself anymore."  States, "I'm depressed.  I really am.  I don't want to keep being like this."  Neuropsychology involved for evaluation and supporg. Recent Psychosocial Issues: Injured at work in June and having cognitive difficulties ever since. Pyschiatric History: None Substance Abuse History: None  Patient / Family Perceptions, Expectations & Goals Pt/Family understanding of illness & functional limitations: Pt and family with general understanding of her TBI suffered at work and of current functional limitations/ purpose of coming to [facility] for intensive evaluations. Premorbid pt/family roles/activities: Prior to June, pt completely independent and working f/t.  Jonelle Sidle has been providing primary support to pt since June. Anticipated changes in roles/activities/participation: Anticipate that family will need to continue to provide 24/7 support if at all possible for safety. Pt/family expectations/goals: "I just want to be able to remember things."  [employer]: None Premorbid Home Care/DME Agencies: Other (Comment)(briefly went to OP rehab) Transportation available at discharge: yes Resource referrals recommended: Neuropsychology, Support group (specify)  Discharge Planning Living Arrangements: Children Support Systems: Children, Other (Comment), Parent, Other relatives(boyfriend) Type of Residence: Private residence Insurance Resources: Multimedia programmer (specify)(Worker's Comp) Museum/gallery curator Resources: Employment Financial Screen Referred: No Living Expenses: Higher education careers adviser  Management: Patient, Family Does the patient have any problems obtaining your medications?: No Home Management: pt with family support. Patient/Family Preliminary Plans: Pt to return home with her daughter as well as sister/ other family members assisting as they are able. Social Work Anticipated Follow Up Needs: HH/OP  Clinical Impression Unfortunate woman here for comprehensive evaluations of deficits felt to be a result of injury at work in June 2019.  She is very pleasant and motivated but does talk openly about her frustration with memory deficits and loss of independence.  Neuropsychology closely involved and anticipate short LOS.  Family will need to continue to provide support as they were doing PTA.  Bridget Anderson 01/04/2018, 4:48 PM

## 2018-01-04 NOTE — Progress Notes (Signed)
Speech Language Pathology Daily Session Note  Patient Details  Name: Bridget Anderson MRN: [id number] Date of Birth: 1970/06/16  Today's Date: 01/04/2018 SLP Individual Time: 0930-1030 SLP Individual Time Calculation (min): 60 min  Short Term Goals: Week 1: SLP Short Term Goal 1 (Week 1): STGs=LTGs due to short length of stay   Skilled Therapeutic Interventions: Skilled treatment session focused on cognitive goals. SLP facilitated session by creating a list of 5 items that ranged from mildly complex to simple that patient had to complete within specific time constraints. Patient able to complete tasks with extra time to spare (30 mins) but required Mod A verbal cues to self-monitor and correct errors during mildly complex problem solving/organization task of organizing a 4 X/day pill box. SLP provided education in regards to memory compensatory strategies and the importance of utilization at discharge. Patient verbalized understanding and agreement. Patient left upright in room. Continue with current plan of care.      Pain No/Denies Pain   Therapy/Group: Individual Therapy  Noe Goyer 01/04/2018, 3:36 PM

## 2018-01-04 NOTE — Progress Notes (Signed)
Routine EEG completed, results pending. 

## 2018-01-04 NOTE — Procedures (Signed)
ELECTROENCEPHALOGRAM REPORT   Patient: Bridget Anderson       Room #: 2W41L EEG No. ID: [id number] Age: 47 y.o.        Sex: female Referring Physician: Naaman Plummer Report Date:  01/04/2018        Interpreting Physician: Alexis Goodell  History: Bridget Anderson is an 47 y.o. female with altered mental status  Medications:  Lotensin, Aricept, Glucophage, Ritalin, Lopressor, Pamelor, Pravachol  Conditions of Recording:  This is a 21 channel routine scalp EEG performed with bipolar and monopolar montages arranged in accordance to the international 10/20 system of electrode placement. One channel was dedicated to EKG recording.  The patient is in the awake and drowsy states.  Description:  The waking background activity consists of a low voltage, symmetrical, fairly well organized, 10 Hz alpha activity, seen from the parieto-occipital and posterior temporal regions.  Low voltage fast activity, poorly organized, is seen anteriorly and is at times superimposed on more posterior regions.  A mixture of theta and alpha rhythms are seen from the central and temporal regions. The patient drowses briefly with slowing to irregular, low voltage theta and beta activity.   Stage II sleep is not obtained. No epileptiform activity is noted.   Hyperventilation was performed produces a mild to moderate buildup but failed to elicit any abnormalities.  Intermittent photic stimulation was performed but failed to illicit any change in the tracing.    IMPRESSION: Normal electroencephalogram, awake, drowsy and with activation procedures. There are no focal lateralizing or epileptiform features.   Alexis Goodell, MD Neurology [phone] 01/04/2018, 5:14 PM

## 2018-01-04 NOTE — Progress Notes (Addendum)
[facility] PHYSICAL MEDICINE & REHABILITATION PROGRESS NOTE   Subjective/Complaints: Pt is up in her room. Just got shoes on. In good spirits. Only has mild headache. Does not recall events from yesterday.   ROS: Patient denies fever, rash, sore throat, blurred vision, nausea, vomiting, diarrhea, cough, shortness of breath or chest pain, joint or back pain, headache, or mood change.   Objective:   No results found. Recent Labs    01/02/18 0451  WBC 9.8  HGB 11.6*  HCT 39.1  PLT 324   Recent Labs    01/02/18 0451  NA 135  K 3.9  CL 105  CO2 26  GLUCOSE 198*  BUN 12  CREATININE 0.84  CALCIUM 8.7*    Intake/Output Summary (Last 24 hours) at 01/04/2018 0855 Last data filed at 01/04/2018 0834 Gross per 24 hour  Intake 720 ml  Output -  Net 720 ml     Physical Exam: Vital Signs Blood pressure 140/76, pulse 64, temperature 98 F (36.7 C), temperature source Oral, resp. rate 18, weight 99.3 kg, last menstrual period 06/27/2013, SpO2 98 %.  Constitutional: No distress . Vital signs reviewed. HEENT: EOMI, oral membranes moist Neck: supple Cardiovascular: RRR without murmur. No JVD    Respiratory: CTA Bilaterally without wheezes or rales. Normal effort    GI: BS +, non-tender, non-distended  Extremities: No clubbing, cyanosis, or edema. Pulses are 2+ Skin: Clean and intact without signs of breakdown Neuro: remembers me as "doctor?"---not sure if she just saw "MD" on my badge however. Doesn't remember therapies from yesterday or people she worked with. I made a comment about her finger nails and she told me "my daughter did them" Normal language.   Cranial nerves 2-12 are intact. Sensory exam is normal. Reflexes are 2+ in all 4's. Fine motor coordination is intact. No tremors. Motor function is grossly 5/5.  Musculoskeletal: Full ROM, No pain with AROM or PROM in the neck, trunk, or extremities. Posture appropriate Psych: pleasant and much more up beat.        Assessment/Plan: 1. Functional deficits secondary to post-concussion syndrome, ongoing cognitive deficits which require 3+ hours per day of interdisciplinary therapy in a comprehensive inpatient rehab setting.  Physiatrist is providing close team supervision and 24 hour management of active medical problems listed below.  Physiatrist and rehab team continue to assess barriers to discharge/monitor patient progress toward functional and medical goals  Care Tool:  Bathing    Body parts bathed by patient: Right arm, Left arm, Chest, Abdomen, Front perineal area, Buttocks, Right upper leg, Left upper leg, Right lower leg, Left lower leg, Face         Bathing assist Assist Level: Independent     Upper Body Dressing/Undressing Upper body dressing   What is the patient wearing?: Pull over shirt    Upper body assist Assist Level: Independent    Lower Body Dressing/Undressing Lower body dressing      What is the patient wearing?: Pants, Underwear/pull up     Lower body assist Assist for lower body dressing: Independent     Toileting Toileting    Toileting assist Assist for toileting: Independent     Transfers Chair/bed transfer  Transfers assist     Chair/bed transfer assist level: Independent     Locomotion Ambulation   Ambulation assist      Assist level: Independent       Walk 10 feet activity   Assist     Assist level: Independent  Walk 50 feet activity   Assist    Assist level: Independent      Walk 150 feet activity   Assist    Assist level: Independent      Walk 10 feet on uneven surface  activity   Assist     Assist level: Independent     Wheelchair     Assist Will patient use wheelchair at discharge?: No             Wheelchair 50 feet with 2 turns activity    Assist            Wheelchair 150 feet activity     Assist            Medical Problem List and Plan: 1.  Memory loss,  headaches, tinnitus, vestibular symptoms, insomnia and emotional liability secondary to postconcussion syndrome  -ongoing goals of this admission are to assess in depth patient's ongoing memory deficits and executive dysfunction in addition to the contribution of any non-organic components to her clinical picture.   -she suffered a relatively mild brain injury 5 months ago. Most recent MRI performed two weeks ago does not reveal any abnormalities. I have not personally reviewed this study as it was done through [facility] so that she could have an open MRI. Sister is bringing in MRI disc tomorrow.  -SLP assessment ongoing--substantial cognitive deficits seen yesterday.   -Neuropsych assessment demonstrates substantial deficits in delayed and immediate memory, as well as attention. Results appear valid  -PT: no vestibular sx  -OT: iADL, cognitive integration in more complex tasks  -ritalin added yesterday which seems to have helped with her focus and anxiety (testing yesterday done with ritalin on board)  -will introduce aricept tonight for memory  -spoke with neurology this morning and requested a consult. Her cognitive deficits are not consistent with a TBI, let alone a mild TBI. Given her uncontrolled HTN there is the possibility of a small stroke in limbic area which may have been between cuts on MRI  -have ordered EEG to assess for any abnl cortical activity  -spoke to sister at length today regarding what we're seeing and re: plan moving forward. Mrs. Umphlett will need some type of supervision at home to help with meds/safety. Sister works and cannot provide that level of Programmer, multimedia. She would benefit from aid at home during the day.  2.  DVT Prophylaxis/Anticoagulation: ambulatory 3. Pain Management: butalbital one capsule by mouth every 6 hours as needed for headaches 4. Mood:  Pamelor 20 mg bedtime   5. Neuropsych: This patient is capable of making decisions on her own behalf. 6. Skin/Wound Care:  Routine skin checks 7. Fluids/Electrolytes/Nutrition:  encourage PO  -eating well.    8. Hyperlipidemia. Lovastatin 9. Diabetes mellitus. Metformin 500 mg daily  -check cbg's more closely  -ordered hgb A1C \ -metformin likely will need to be increased 10. Hypertension. Benazepril 40 mg twice daily,metoprolol 50 mg twice daily  -increased metoprolol to 75mg  bid---improved control thus far  -under [facility] better control---headaches have improved as well. 11. Asthma. Symbicort/pro-air    LOS: 3 days A FACE TO FACE EVALUATION WAS PERFORMED  Meredith Staggers 01/04/2018, 8:55 AM

## 2018-01-04 NOTE — Discharge Summary (Signed)
Bridget Bridget Anderson, Bridget Anderson MEDICAL RECORD [id number] ACCOUNT [id number] DATE OF BIRTH:[birth year] FACILITY: [facility] LOCATION: [facility] PHYSICIAN:Bridget SWARTZ, MD  DISCHARGE SUMMARY  DATE OF DISCHARGE:  01/05/2018  DATE OF ADMISSION:  01/01/2018  DATE OF DISCHARGE:  01/05/2018   DISCHARGE DIAGNOSES: 1.  Memory loss, headaches, tinnitus vestibular symptoms, insomnia, and emotional lability secondary to postconcussive syndrome with history of mild brain injury 5 months ago. 2.  Thigh high stockings for deep venous thrombosis prophylaxis. 3.  Pain management. 4.  Mood. 5.  Hypertension. 6.  Diabetes mellitus. 7.  Asthma.  HOSPITAL COURSE:  This is a 47 year old right-handed female with history of hypertension, migraine headaches followed by neurology services, Dr. Jaynee Anderson in the past as well as diabetes mellitus.  She lives with her 92 year old daughter.  She has family in  the area.  The patient involved in a work-related accident at [employer] and [employer] 08/05/2017 when an 85 pound pipe fell and struck her in the head.  She initially presented to [facility] after the accident complaining of nausea and vertigo with  headache.  She could not recall full events of the accident.  CT of the head unremarkable for intracranial process.  She was treated for a scalp contusion and released.  There was no loss of consciousness but claims to have transient loss of vision and  hearing.  She again presented to [facility] emergency department 08/08/2017 with increased headache, nausea, dizziness, blurred vision also ringing in her ears.  She did not have any focal weakness.  CTA of neck negative for dissection.  She again was  released to home, given a trial of antiemetic.  The patient noted persistent headaches, bouts of memory loss.  Follow up outpatient rehab center for postconcussive syndrome.  She was attending outpatient therapies.  Noted some elevated blood pressure  202/122.  The patient was  admitted for comprehensive rehabilitation program.  PAST MEDICAL HISTORY:  See discharge diagnoses.  SOCIAL HISTORY:  Lives with her 45 year old daughter, was independent prior to admission.  FUNCTIONAL STATUS:  Upon admission to rehab services was supervision to ambulate.  PHYSICAL EXAMINATION: VITAL SIGNS:  Blood pressure 180/90, pulse 80, temperature 98, respirations 18. GENERAL:  Alert female, obvious memory loss from recent ED visits.  Provides her name and age. NEUROLOGIC:  Motor strength 5/5, deficit short term memory, attention and concentration. HEENT:  EOMs intact. NECK:  Supple, nontender, no JVD. CARDIOVASCULAR:  Rate controlled. ABDOMEN:  Soft, nontender, good bowel sounds. LUNGS:  Clear to auscultation without wheeze.  REHABILITATION HOSPITAL COURSE:  The patient was admitted to inpatient rehabilitation services.  Therapies initiated on a 3-hour daily basis, consisting of physical therapy, occupational therapy, speech therapy and rehabilitation nursing.  The following  issues were addressed.  In regards to the patient's work-related accident 08/05/2017 when she was struck in the head with an 85 pound pipe.  Persistent memory loss, headache, tinnitus vestibular symptoms, insomnia, emotional lability.  Most recent MRI  performed at outside hospital did not show any abnormalities.  She was receiving followup for neuropsychology.Neurology services consulted for input EEG negative. Lab work fairly unremarkable except for a mildly low cortisol level which could be nonspecific.  During her rehabilitation stay as well as speech therapy, cognitive integration of complex tasks, trial of Ritalin was added for attention as  well as memory as well as consideration for Aricept trial initiated 5 mg daily.  Pain management with intermittent headaches using Fioricet.  Blood pressure with some increases, monitoring.  Lopressor  added and titrated to 75 mg p.o. b.i.d. as well as Lotensin 40 mg p.o.   b.i.d. Diabetes mellitus elevated hemoglobin A1c 10 with full Diabetic teaching. Case management has been discussing case with Worker's Comp.in regards to patient's care at discharge.Working to establish a home aid for patient safety. The patient received weekly collaborative interdisciplinary team conferences.  In regards to neuropsych testing performing on testing fell at the bottom of borderline range of functioning, clearly indicative of significant general cognitive  impairments with some variability within various neuropsychological components.  The patient produced immediate memory index score of 49, which falls in the extremely low range of functioning.  The patient clearly showed great difficulty with learning  new information, being able encode and store that information initially.  Immediate memory deficits in range impacting her overall function.  The patient produced a visual spatial construction index score of 100, which falls in the average range.   Produced significant impaired performance inattention.  With regards to concentration she had significant difficulties with simple auditory registration.  All these issues were shared with the patient and family.  Ongoing outpatient testing would be  noted.  She was able to gather her belongings for activities of daily living and homemaking again needing supervision for safety.  She would forget her room and need cueing.  She can ambulate throughout the rehab unit again with supervision for her  safety.  All therapy and neuropsych testing discussed with patient, family and plan discharge to home on 01/05/2018.  DISCHARGE MEDICATIONS:  Included Lotensin 40 mg p.o. b.i.d., Aricept 5mg  daily,,Glucotrol 5mg  daily, Ritalin 5 mg p.o. b.i.d., Lopressor 75 mg p.o. b.i.d., Pamelor 10 mg p.o. at bedtime, Pravachol 10 mg daily, Fioricet 1 tablet every 6 hours as needed headache.  DIET:  Diabetic diet.    FOLLOWUP:  She would follow up with Dr. Alger Anderson at the outpatient rehab service office as directed; Dr. Woody Anderson, medical management.  SPECIAL INSTRUCTIONS:  Supervision for patient's safety.  No driving.  TN/NUANCE W:73/71/0626 T:01/04/2018 JOB:[id number]

## 2018-01-04 NOTE — Progress Notes (Signed)
Speech Language Pathology Discharge Summary  Patient Details  Name: Bridget Anderson MRN: [id number] Date of Birth: 06-19-1970  Today's Date: 01/04/2018 SLP Individual Time: 1300-1345 SLP Individual Time Calculation (min): 45 min   Skilled Therapeutic Interventions:  Skilled treatment session focused on cognitive goals. SLP facilitated session by providing Min A verbal cues for problem solving during a mildly complex task of cleaning and organizing the kitchen after a kitchen task patient performed earlier in the day. Mod A verbal cues were also provided for use of memory compensatory strategies during recall tasks (finding way back to room). SLP provided handout to patient to maximize recall and utilization of memory compensatory strategies at discharge. Patient left upright in recliner. Continue with current plan of care.    Patient has met 4 of 5 long term goals.  Patient to discharge at overall Mod;Max level.   Reasons goals not met: Patient continues to require overall Max A multimodal cues for recall of daily information    Clinical Impression/Discharge Summary: Patient has made excellent gains and has met 4 of 5 LTGs this admission. Currently, patient demonstrates behaviors consistent with a Rancho Level VIII and requires overall Mod A verbal cues for utilization of memory compensatory strategies to maximize recall, complex problem solving and emergent awareness of errors. Patient also has demonstrated improved attention and can alternate attention between tasks with extra time. Patient education complete and patient will discharge home with 24 hour supervision from family. Patient would benefit from f/u outpatient SLP services to maximize her overall cognitive functioning and functional independence in order to reduce caregiver burden.   Care Partner:  Caregiver Able to Provide Assistance: Yes  Type of Caregiver Assistance: Cognitive  Recommendation:  24 hour  supervision/assistance;Outpatient SLP  Rationale for SLP Follow Up: Maximize cognitive function and independence;Reduce caregiver burden   Equipment: N/A   Reasons for discharge: Discharged from hospital   Patient/Family Agrees with Progress Made and Goals Achieved: Yes    [address], [place] 01/04/2018, 3:44 PM

## 2018-01-04 NOTE — Care Management (Signed)
[facility] Individual Statement of Services  Patient Name:  Bridget Anderson  Date:  01/04/2018  Welcome to the [facility].  Our goal is to provide you with an individualized program based on your diagnosis and situation, designed to meet your specific needs.  With this comprehensive rehabilitation program, you will be expected to participate in at least 3 hours of rehabilitation therapies Monday-Friday, with modified therapy programming on the weekends.  Your rehabilitation program will include the following services:  Physical Therapy (PT), Occupational Therapy (OT), Speech Therapy (ST), 24 hour per day rehabilitation nursing, Therapeutic Recreaction (TR), Neuropsychology, Case Management (Social Worker), Rehabilitation Medicine, Nutrition Services and Pharmacy Services  Weekly team conferences will be held on Tuesdays to discuss your progress.  Your Social Worker will talk with you frequently to get your input and to update you on team discussions.  Team conferences with you and your family in attendance may also be held.  Expected length of stay: 3-4 days   Overall anticipated outcome: supervision  Depending on your progress and recovery, your program may change. Your Social Worker will coordinate services and will keep you informed of any changes. Your Social Worker's name and contact numbers are listed  below.  The following services may also be recommended but are not provided by the [facility] will be made to provide these services after discharge if needed.  Arrangements include referral to agencies that provide these services.  Your insurance has been verified to be:  Worker's Comp Your primary doctor is:  Counsellor  Pertinent information will be shared with your doctor and your  insurance company.  Social Worker:  [place], [place] or ([phone]   Information discussed with and copy given to patient by: Lennart Pall, 01/04/2018, 4:51 PM

## 2018-01-04 NOTE — IPOC Note (Addendum)
Overall Plan of Care [facility]) Patient Details Name: Bridget Anderson MRN: [id number] DOB: Jul 04, 1970  Admitting Diagnosis: <principal problem not specified>  Hospital Problems: Active Problems:   Postconcussive syndrome   Asthma   Essential hypertension   Diabetes mellitus type 2 in obese [facility])   Dyslipidemia   Vascular headache   Anterograde amnesia     Functional Problem List: Nursing Safety, Medication Management  PT Motor, Endurance, Pain  OT Cognition, Safety  SLP Cognition  TR         Basic ADL's: OT       Advanced  ADL's: OT       Transfers: PT    OT       Locomotion: PT Stairs     Additional Impairments: OT None  SLP Social Cognition   Social Interaction, Memory, Problem Solving, Attention, Awareness  TR      Anticipated Outcomes Item Anticipated Outcome  Self Feeding    Swallowing      Basic self-care     Toileting      Bathroom Transfers    Bowel/Bladder  Pt will manage bowel and bladder with mod I assist   Transfers  indpendent  Locomotion  independent  Communication     Cognition  Mod A   Pain  Pt will manage pain at 3 or less on a scale of 0-10.   Safety/Judgment  Pt will remain free of falls and injury with mod I assist.    Therapy Plan: PT Intensity: Minimum of 1-2 x/day ,45 to 90 minutes PT Frequency: 5 out of 7 days PT Duration Estimated Length of Stay: 3 days OT Intensity: Minimum of 1-2 x/day, 45 to 90 minutes OT Frequency: 5 out of 7 days OT Duration/Estimated Length of Stay: 3-5 days SLP Intensity: Minumum of 1-2 x/day, 30 to 90 minutes SLP Frequency: 3 to 5 out of 7 days SLP Duration/Estimated Length of Stay: 3-5 days     Team Interventions: Nursing Interventions Patient/Family Education, Cognitive Remediation/Compensation, Discharge Planning  PT interventions Balance/vestibular training, Ambulation/gait training, Therapeutic Exercise, Community reintegration, Discharge planning, Splinting/orthotics, Neuromuscular  re-education, UE/LE Coordination activities, UE/LE Strength taining/ROM, Cognitive remediation/compensation, Functional electrical stimulation, Therapeutic Activities, DME/adaptive equipment instruction, Patient/family education, Pain management, Stair training  OT Interventions Community reintegration, Cognitive remediation/compensation, Discharge planning, Self Care/advanced ADL retraining, Psychosocial support, Therapeutic Activities  SLP Interventions Cognitive remediation/compensation, Internal/external aids, English as a second language teacher, Environmental controls, Therapeutic Activities, Patient/family education, Functional tasks  TR Interventions    SW/CM Interventions Discharge Planning, Psychosocial Support, Patient/Family Education   Barriers to Discharge MD  Behavior  Nursing      PT      OT      SLP      SW       Team Discharge Planning: Destination: PT-  ,OT- Home , SLP-Home Projected Follow-up: PT-Outpatient PT, OT-  Outpatient OT, SLP-24 hour supervision/assistance, Outpatient SLP Projected Equipment Needs: PT-None recommended by PT, OT-none recommended  , SLP-None recommended by SLP Equipment Details: PT- , OT-  Patient/family involved in discharge planning: PT- Patient,  OT-Patient, SLP-Patient  MD ELOS: 4-6 days Medical Rehab Prognosis:  Excellent Assessment: being assessed for etiology behind ongoing cognitive deficits. Pain better controlled. bp much improved. Neurology consult pending. EEG today. Labs in am    See Team Conference Notes for weekly updates to the plan of care

## 2018-01-04 NOTE — Progress Notes (Signed)
Patient unavailable for EEG per nurse until after 1:45 PM; will attempt EEG later as schedule permits.

## 2018-01-04 NOTE — Discharge Summary (Signed)
Discharge summary job [phone]

## 2018-01-04 NOTE — Progress Notes (Signed)
Physical Therapy Discharge Summary  Patient Details  Name: Bridget Anderson MRN: [id number] Date of Birth: 07/27/1970  Today's Date: 01/04/2018 PT Individual Time: 0830-0857 PT Individual Time Calculation (min): 27 min   Pt performs all gait and mobility with independence.  Pt performs HEP with min cues for memory and referencing HEP handout.  Pt able to recall location of theraband and HEP in room but requires cues for path finding back to room at end of session. Pt continues with strength deficits and pain with stair negotiation. Pt states she feels prepared for d/c home with outpatient therapy.  Patient has met 0 of 2 long term goals due to short LOS and continuing cognitive and physical deficits.  Patient to discharge at an ambulatory level Independent.    Reasons goals not met: pt continues to require assistance with memory and alternating attention with HEP, continues to have decreased Lt knee strength due to pain  Recommendation:  Patient will benefit from ongoing skilled PT services in outpatient setting to continue to advance safe functional mobility, address ongoing impairments in strength, memory, and minimize fall risk.  Equipment: No equipment provided  Reasons for discharge: treatment goals met and discharge from hospital  Patient/family agrees with progress made and goals achieved: Yes  PT Discharge Precautions/Restrictions Precautions Precaution Comments: congitive deficts - memory  Restrictions Weight Bearing Restrictions: No Pain Pain Assessment Pain Score: 4  Pain Type: Chronic pain Pain Location: Knee Pain Orientation: Left Pain Descriptors / Indicators: Aching Pain Onset: With Activity Pain Intervention(s): Repositioned;Rest   Cognition Overall Cognitive Status: Impaired/Different from baseline Arousal/Alertness: Awake/alert Orientation Level: Oriented X4 Awareness: Impaired Awareness Impairment: Anticipatory impairment Safety/Judgment: Appears  intact Rancho [place] Amigos Scales of Cognitive Functioning: Purposeful/appropriate Sensation Sensation Light Touch: Appears Intact Proprioception: Appears Intact Coordination Gross Motor Movements are Fluid and Coordinated: Yes Fine Motor Movements are Fluid and Coordinated: Yes Motor  Motor Motor: Within Functional Limits  Mobility Transfers Sit to Stand: Independent Stand Pivot Transfers: Independent Transfer (Assistive device): None Locomotion  Gait Gait Assistance: Independent Stairs / Additional Locomotion Stairs Assistance: Independent Stair Management Technique: Two rails Number of Stairs: 16 Ramp: Independent  Trunk/Postural Assessment  Cervical Assessment Cervical Assessment: (fwd head) Thoracic Assessment Thoracic Assessment: (mild kyphosis) Lumbar Assessment Lumbar Assessment: Within Functional Limits Postural Control Postural Control: Within Functional Limits  Balance Dynamic Standing Balance Dynamic Standing - Level of Assistance: 7: Independent Extremity Assessment      RLE Assessment General Strength Comments: hip flex 3/5, hip abd 4-/5, knee flex 4/5, knee ext 4/5 LLE Assessment General Strength Comments: hip flex 3/5, knee flex 3/5, knee ext 3/5, hip abd 4-/5    , 01/04/2018, 9:01 AM   

## 2018-01-05 LAB — TSH: TSH: 1.087 u[IU]/mL (ref 0.350–4.500)

## 2018-01-05 LAB — FERRITIN: FERRITIN: 221 ng/mL (ref 11–307)

## 2018-01-05 LAB — BASIC METABOLIC PANEL
ANION GAP: 9 (ref 5–15)
BUN: 10 mg/dL (ref 6–20)
CALCIUM: 8.7 mg/dL — AB (ref 8.9–10.3)
CHLORIDE: 104 mmol/L (ref 98–111)
CO2: 22 mmol/L (ref 22–32)
Creatinine, Ser: 0.87 mg/dL (ref 0.44–1.00)
GFR calc Af Amer: >60 mL/min (ref >60)
GFR calc non Af Amer: >60 mL/min (ref >60)
Glucose, Bld: 169 mg/dL — ABNORMAL HIGH (ref 70–99)
POTASSIUM: 4 mmol/L (ref 3.5–5.1)
Sodium: 135 mmol/L (ref 135–145)

## 2018-01-05 LAB — IRON AND TIBC
IRON: 65 ug/dL (ref 28–170)
Saturation Ratios: 23 % (ref 10.4–31.8)
TIBC: 281 ug/dL (ref 250–450)
UIBC: 216 ug/dL

## 2018-01-05 LAB — CBC
HEMATOCRIT: 39.5 % (ref 36.0–46.0)
HEMOGLOBIN: 11.9 g/dL — AB (ref 12.0–15.0)
MCH: 24.8 pg — ABNORMAL LOW (ref 26.0–34.0)
MCHC: 30.1 g/dL (ref 30.0–36.0)
MCV: 82.5 fL (ref 80.0–100.0)
NRBC: 0 % (ref 0.0–0.2)
Platelets: 342 10*3/uL (ref 150–400)
RBC: 4.79 MIL/uL (ref 3.87–5.11)
RDW: 13.2 % (ref 11.5–15.5)
WBC: 10.2 10*3/uL (ref 4.0–10.5)

## 2018-01-05 LAB — HEMOGLOBIN A1C
Hgb A1c MFr Bld: 10 % — ABNORMAL HIGH (ref 4.8–5.6)
MEAN PLASMA GLUCOSE: 240.3 mg/dL

## 2018-01-05 LAB — VITAMIN B12: VITAMIN B 12: 349 pg/mL (ref 180–914)

## 2018-01-05 LAB — FOLATE: Folate: 13.3 ng/mL (ref >5.9)

## 2018-01-05 LAB — CORTISOL-AM, BLOOD: Cortisol - AM: 3.6 ug/dL — ABNORMAL LOW (ref 6.7–22.6)

## 2018-01-05 MED ORDER — GLIPIZIDE ER 5 MG PO TB24
5.0000 mg | ORAL_TABLET | Freq: Every day | ORAL | Status: DC
  Administered 2018-01-05: 5 mg via ORAL
  Filled 2018-01-05: qty 1

## 2018-01-05 MED ORDER — ONETOUCH DELICA LANCETS 33G MISC: 1.0000 | Freq: Two times a day (BID) | 0 refills | Status: AC

## 2018-01-05 MED ORDER — METOPROLOL TARTRATE 75 MG PO TABS: 75.0000 mg | ORAL_TABLET | Freq: Two times a day (BID) | ORAL | 1 refills | Status: DC

## 2018-01-05 MED ORDER — BENAZEPRIL HCL 40 MG PO TABS: 40.0000 mg | ORAL_TABLET | Freq: Two times a day (BID) | ORAL | 2 refills | Status: AC

## 2018-01-05 MED ORDER — GLUCOSE BLOOD VI STRP: ORAL_STRIP | 0 refills | Status: AC

## 2018-01-05 MED ORDER — BUTALBITAL-APAP-CAFFEINE 50-325-40 MG PO TABS: 1.0000 | ORAL_TABLET | Freq: Four times a day (QID) | ORAL | 0 refills | Status: DC | PRN

## 2018-01-05 MED ORDER — VITAMIN D (ERGOCALCIFEROL) 1.25 MG (50000 UNIT) PO CAPS: 50000.0000 [IU] | ORAL_CAPSULE | ORAL | 0 refills | Status: DC

## 2018-01-05 MED ORDER — ONETOUCH VERIO FLEX SYSTEM W/DEVICE KIT: PACK | 0 refills | Status: AC

## 2018-01-05 MED ORDER — DONEPEZIL HCL 5 MG PO TABS: 5.0000 mg | ORAL_TABLET | Freq: Every day | ORAL | 1 refills | Status: DC

## 2018-01-05 MED ORDER — NORTRIPTYLINE HCL 10 MG PO CAPS: 10.0000 mg | ORAL_CAPSULE | Freq: Every day | ORAL | 1 refills | Status: DC

## 2018-01-05 MED ORDER — PRAVASTATIN SODIUM 10 MG PO TABS: 10.0000 mg | ORAL_TABLET | Freq: Every day | ORAL | 1 refills | Status: DC

## 2018-01-05 MED ORDER — METHYLPHENIDATE HCL 10 MG PO TABS: 10.0000 mg | ORAL_TABLET | Freq: Two times a day (BID) | ORAL | 0 refills | Status: DC

## 2018-01-05 MED ORDER — GLIPIZIDE ER 5 MG PO TB24: 5.0000 mg | ORAL_TABLET | Freq: Every day | ORAL | 1 refills | Status: DC

## 2018-01-05 NOTE — Progress Notes (Signed)
Social Work  Discharge Note  The overall goal for the admission was met for:   Discharge location: Yes - returning home with family to resume 24/7 supervision  Length of Stay: Yes - 4 days  Discharge activity level: Yes - supervision  Home/community participation: Yes  Services provided included: MD, RD, PT, OT, SLP, RN, Pharmacy, Neuropsych and SW  Financial Services: Worker's Comp  Follow-up services arranged: Outpatient: PT, OT, ST via [facility] and Patient/Family has no preference for HH/DME agencies  Comments (or additional information):  Patient/Family verbalized understanding of follow-up arrangements: Yes  Individual responsible for coordination of the follow-up plan: pt  Confirmed correct DME delivered: NA Apryl Brymer

## 2018-01-05 NOTE — Consult Note (Addendum)
Neurology Consultation  Reason for Consult: TBI, memory loss Referring Physician: Dr. Naaman Plummer   CC: Memory issues  History is obtained from: Patient, family, and chart review  HPI: Bridget Anderson is a 47 y.o. female with a pmhx of HTN, migraines, and diabetes. Patient was involved in a work related accident at [employer] 08/05/2017 where an 85 lb pipe fell and struck her in the head. She denied LOC, but did have transient loss of vision and hearing. She presented to [facility] ED with complaint of nausea, vertigo, and headaches. Also could not fully recall the events of the accident. CT head was negative for acute process. She was treated for a scalp contusion and then discharged. She presented again to the ED three days later with worsening headache, nausea, dizziness, blurred vision, and tinnitis. CTA of the neck was negative for dissection. She was again discharged home with outpatient rehab for postconcussive syndrome. During outpatient therapy, she was noted to have elevated BPs 202/122 and continued headaches, and persisent memory loss. She was then admitted to inpatient rehab on 11/11.   Underwent neuro psych evaluation on 11/12 & 11/13. Felt to have very clear anterior grade memory deficits. Presentation not consistent with diffuse axonal injury or related to phychiatric illness of conscious attempts to take or exaggerate deficits. While MRI did not suggest acute process, there was concern for possible small focal injury thalamus or hypothalamus given her persistent memory deficits. Close review of outside MRI was suggested by neuropsychiatry. MRI closely reviewed today per Dr. Lorraine Lax who is in agreement with the report. No focal injury or acute change, largely normal MRI.    ROS: A 14 point ROS was performed and is negative except as noted in the HPI.   Past Medical History:  Diagnosis Date  . Chlamydia   . Diabetes ([place])   . GERD (gastroesophageal reflux disease)   . Hyperlipidemia   .  Hypertension   . Migraine   . Obstructive sleep apnea on CPAP    pt has not worn CPAP in years  . Seasonal allergies   . Wears glasses     Family History  Problem Relation Age of Onset  . Hypertension Mother   . Hyperlipidemia Mother   . Hypertension Father   . Hyperlipidemia Father   . Thyroid disease Father   . Hypertension Brother     Social History:   reports that she has never smoked. She has never used smokeless tobacco. She reports that she drinks alcohol. She reports that she does not use drugs.  Medications  Current Facility-Administered Medications:  .  albuterol (PROVENTIL) (2.5 MG/3ML) 0.083% nebulizer solution 2.5 mg, 2.5 mg, Nebulization, Q6H PRN, Meredith Staggers, MD .  benazepril (LOTENSIN) tablet 40 mg, 40 mg, Oral, BID, Angiulli, Lavon Paganini, PA-C, 40 mg at 01/05/18 2202 .  butalbital-acetaminophen-caffeine (FIORICET, ESGIC) 50-325-40 MG per tablet 1 tablet, 1 tablet, Oral, Q6H PRN, Angiulli, Lavon Paganini, PA-C, 1 tablet at 01/04/18 1946 .  donepezil (ARICEPT) tablet 5 mg, 5 mg, Oral, QHS, Meredith Staggers, MD, 5 mg at 01/04/18 1946 .  glipiZIDE (GLUCOTROL XL) 24 hr tablet 5 mg, 5 mg, Oral, Q breakfast, Alger Simons T, MD .  methylphenidate (RITALIN) tablet 10 mg, 10 mg, Oral, BID WC, Meredith Staggers, MD, 10 mg at 01/05/18 [phone] .  metoprolol tartrate (LOPRESSOR) tablet 75 mg, 75 mg, Oral, BID, Meredith Staggers, MD, 75 mg at 01/05/18 0623 .  nortriptyline (PAMELOR) capsule 10 mg, 10 mg,  Oral, QHS, AngiulliLavon Paganini, PA-C, 10 mg at 01/04/18 2216 .  ondansetron (ZOFRAN) tablet 4 mg, 4 mg, Oral, Q6H PRN **OR** ondansetron (ZOFRAN) injection 4 mg, 4 mg, Intravenous, Q6H PRN, Angiulli, Lavon Paganini, PA-C .  pravastatin (PRAVACHOL) tablet 10 mg, 10 mg, Oral, q1800, Angiulli, Lavon Paganini, PA-C, 10 mg at 01/04/18 1742 .  sorbitol 70 % solution 30 mL, 30 mL, Oral, Daily PRN, Angiulli, Lavon Paganini, PA-C  Exam: Current vital signs: BP 120/82   Pulse 62   Temp 98.3 F (36.8 C)    Resp 17   Wt 99.3 kg   LMP 06/27/2013   SpO2 100%   BMI 35.35 kg/m  Vital signs in last 24 hours: Temp:  [98.2 F (36.8 C)-98.3 F (36.8 C)] 98.3 F (36.8 C) (11/15 0606) Pulse Rate:  [54-62] 62 (11/15 0728) Resp:  [17-20] 17 (11/15 0606) BP: (112-166)/(82-94) 120/82 (11/15 0728) SpO2:  [100 %] 100 % (11/15 0606)  Physical Exam Constitutional: NAD, appears comfortable HEENT: Atraumatic, normocephalic. PERRL, anicteric sclera.  Neck: Supple, trachea midline.  Cardiovascular: RRR, no murmurs, rubs, or gallops.  Pulmonary/Chest: CTAB, no wheezes, rales, or rhonchi.  Extremities: Warm and well perfused. No edema.  Skin: No rashes or erythema  Psychiatric: Normal mood and affect  NEURO:  Mental Status: AA&Ox3  Language: speech is normal.  Naming, repetition, fluency, and comprehension intact. Cranial Nerves: PERR L2 mm/brisk. EOMI, visual fields full, no facial asymmetry, hearing intact, tongue/uvula/soft palate midline, normal sternocleidomastoid and trapezius muscle strength. No evidence of tongue atrophy or fibrillations Motor: 5/5 all extremities Tone: is normal and bulk is normal Sensation- Intact to light touch bilaterally Coordination: Mild dysmetria on finger to nose bilaterally, but no ataxia Gait- Normal gate   Labs I have reviewed labs in epic and the results pertinent to this consultation are:   CBC    Component Value Date/Time   WBC 10.2 01/05/2018 0441   RBC 4.79 01/05/2018 0441   HGB 11.9 (L) 01/05/2018 0441   HGB 12.6 05/25/2017 1022   HCT 39.5 01/05/2018 0441   HCT 39.7 05/25/2017 1022   PLT 342 01/05/2018 0441   MCV 82.5 01/05/2018 0441   MCV 81 05/25/2017 1022   MCH 24.8 (L) 01/05/2018 0441   MCHC 30.1 01/05/2018 0441   RDW 13.2 01/05/2018 0441   RDW 14.2 05/25/2017 1022   LYMPHSABS 4.1 (H) 01/02/2018 0451   LYMPHSABS 3.7 (H) 05/25/2017 1022   MONOABS 0.6 01/02/2018 0451   EOSABS 0.5 01/02/2018 0451   EOSABS 0.2 05/25/2017 1022   BASOSABS  0.1 01/02/2018 0451   BASOSABS 0.0 05/25/2017 1022    CMP     Component Value Date/Time   NA 135 01/05/2018 0441   NA 135 05/25/2017 1022   K 4.0 01/05/2018 0441   CL 104 01/05/2018 0441   CO2 22 01/05/2018 0441   GLUCOSE 169 (H) 01/05/2018 0441   BUN 10 01/05/2018 0441   BUN 10 05/25/2017 1022   CREATININE 0.87 01/05/2018 0441   CALCIUM 8.7 (L) 01/05/2018 0441   PROT 7.1 01/02/2018 0451   PROT 7.6 05/25/2017 1022   ALBUMIN 3.1 (L) 01/02/2018 0451   ALBUMIN 4.0 05/25/2017 1022   AST 14 (L) 01/02/2018 0451   ALT 13 01/02/2018 0451   ALKPHOS 76 01/02/2018 0451   BILITOT 0.5 01/02/2018 0451   BILITOT 0.4 05/25/2017 1022   GFRNONAA >60 01/05/2018 0441   GFRAA >60 01/05/2018 0441    Lipid Panel     Component Value  Date/Time   CHOL 242 (H) 05/25/2017 1022   TRIG 101 05/25/2017 1022   HDL 44 05/25/2017 1022   LDLCALC 178 (H) 05/25/2017 1022     Imaging I have reviewed the images obtained:  MRI examination of the brain: - normal, no acute change  Assessment: 47 yo F with hx of HTN and DM presenting with persisent short term memory loss and headaches after a traumatic brain injury on 08/05/2017.   Impression: Sequela of TBI  Recommendations: -- Outpatient neuro follow up -- Continue outpatient therapy, neuropsychiatry  -- Vit B12 normal 05/25/2017. Recheck prior to discharge. Vitamin B1 & MMA pending.   Velna Ochs, M.D. - PGY3 01/05/2018, 10:25 AM      NEUROHOSPITALIST ADDENDUM Performed a face to face diagnostic evaluation.   I have reviewed the contents of history and physical exam as documented by PA/ARNP/Resident and agree with above documentation.  I have discussed and formulated the above plan as documented. Edits to the note have been made as needed.  Consulted for short-term memory loss that seems out of proportion to degree of traumatic brain injury.  Patient's family states memory problems began only after sustaining an injury while at work.  Her  memory has not been progressively getting worse, however has not improved as well despite head injury a few months ago.  Reviewed MRI brain images from scan performed at outside facility.  No evidence of hemorrhage on susceptibility weighted images.  Reassured family that her MRI brain is normal and that memory problems will likely improve over time.  She does seem to have impaired attention which can significantly affect her memory.  She has been started on Ritalin.  I have added vitamin levels such as B1 and MMA to check for other causes for memory loss.  Patient can follow-up with outpatient neurology for memory impairment persists or worsens.    Karena Addison Aroor MD [facility] [phone]   If 7pm to 7am, please call on call as listed on AMION.

## 2018-01-05 NOTE — Progress Notes (Signed)
Occupational Therapy Session Note  Patient Details  Name: Bridget Anderson MRN: [id number] Date of Birth: May 31, 1970  Today's Date: 01/04/2018 OT Individual Time:  -  11:00-12:00      Skilled Therapeutic Interventions/Progress Updates:    1:1 Pt able to meet this therapist at the RN station at correct time (by alarm on her phone set with SLp in [place] session). Pt asked another staff member near her room to guide her to the right place.  Focus on preparing a full meal in the kitchen making chicken alfredo with broccoli. All food supplies out on the counter. Pt did require VC to locate items in unfamiliar kitchen. Pt able to cook entire meal safely with distant supervision/ check in but did not require an verbal cues for safety nor sequencing. Pt even able to demonstrate functional problem solving with presentation of meal with bone in it rather than boneless. Pt safe in the kitchen with handling raw meat.  In discussion with her daughter and the holidays. Pt able to recall that her daughter wants the latest I phone and that she is waiting for the phone company to run a deal buy one get one free. Discussion about her daughter religious views and her last visit with her sister and visiting her church.   Pt served food and able to find her room again with signage without cues with supervision.    Therapy Documentation Precautions:  Precautions Precaution Comments: congitive deficts - memory  Restrictions Weight Bearing Restrictions: No General:   Vital Signs: Therapy Vitals Temp: 98.3 F (36.8 C) Pulse Rate: 62 Resp: 17 BP: 120/82 Patient Position (if appropriate): Sitting Oxygen Therapy SpO2: 100 % O2 Device: Room Air Pain:   ADL: ADL Eating: Independent Grooming: Independent Upper Body Bathing: Independent Lower Body Bathing: Independent Upper Body Dressing: Independent Lower Body Dressing: Independent Toileting: Independent Toilet Transfer: Programmer, applications  Method: Wellsite geologist Transfer: IT consultant Method: Ambulating   Therapy/Group: Individual Therapy  Willeen Cass [facility] 01/05/2018, 8:05 AM

## 2018-01-05 NOTE — Progress Notes (Signed)
Patient discharged to home, accompanied by her mother and sister

## 2018-01-05 NOTE — Progress Notes (Signed)
[facility] PHYSICAL MEDICINE & REHABILITATION PROGRESS NOTE   Subjective/Complaints: Pt up in bed. Asked if I was going to let her go home today. Had notes with her and was reading off from them. Referred to [facility] and [place]/conversation they had.   ROS: Patient denies fever, rash, sore throat, blurred vision, nausea, vomiting, diarrhea, cough, shortness of breath or chest pain, joint or back pain, minimal headache, or mood change.    Objective:   No results found. Recent Labs    01/05/18 0441  WBC 10.2  HGB 11.9*  HCT 39.5  PLT 342   Recent Labs    01/05/18 0441  NA 135  K 4.0  CL 104  CO2 22  GLUCOSE 169*  BUN 10  CREATININE 0.87  CALCIUM 8.7*    Intake/Output Summary (Last 24 hours) at 01/05/2018 0831 Last data filed at 01/04/2018 1745 Gross per 24 hour  Intake 700 ml  Output -  Net 700 ml     Physical Exam: Vital Signs Blood pressure 120/82, pulse 62, temperature 98.3 F (36.8 C), resp. rate 17, weight 99.3 kg, last menstrual period 06/27/2013, SpO2 100 %.  Constitutional: No distress . Vital signs reviewed. HEENT: EOMI, oral membranes moist Neck: supple Cardiovascular: RRR without murmur. No JVD    Respiratory: CTA Bilaterally without wheezes or rales. Normal effort    GI: BS +, non-tender, non-distended   Extremities: No clubbing, cyanosis, or edema. Pulses are 2+ Skin: Clean and intact without signs of breakdown Neuro: realized that I was doctor (because of stethoscope). Able to ask questions about why she couldn't remember. More attentive. Utilized her notes to formulate thoughts and questions. Took notes while we talked. Normal language.   Cranial nerves 2-12 are intact. Sensory exam is normal. Reflexes are 2+ in all 4's. Fine motor coordination is intact. No tremors. Motor function is grossly 5/5.  Musculoskeletal: Full ROM, No pain with AROM or PROM in the neck, trunk, or extremities. Posture appropriate Psych: a little more anxious today        Assessment/Plan: 1. Functional deficits secondary to post-concussion syndrome, ongoing cognitive deficits which require 3+ hours per day of interdisciplinary therapy in a comprehensive inpatient rehab setting.  Physiatrist is providing close team supervision and 24 hour management of active medical problems listed below.  Physiatrist and rehab team continue to assess barriers to discharge/monitor patient progress toward functional and medical goals  Care Tool:  Bathing    Body parts bathed by patient: Right arm, Left arm, Chest, Abdomen, Front perineal area, Buttocks, Right upper leg, Left upper leg, Right lower leg, Left lower leg, Face         Bathing assist Assist Level: Independent     Upper Body Dressing/Undressing Upper body dressing   What is the patient wearing?: Pull over shirt    Upper body assist Assist Level: Independent    Lower Body Dressing/Undressing Lower body dressing      What is the patient wearing?: Pants, Underwear/pull up     Lower body assist Assist for lower body dressing: Independent     Toileting Toileting    Toileting assist Assist for toileting: Independent     Transfers Chair/bed transfer  Transfers assist     Chair/bed transfer assist level: Independent     Locomotion Ambulation   Ambulation assist      Assist level: Independent       Walk 10 feet activity   Assist     Assist level: Independent  Walk 50 feet activity   Assist    Assist level: Independent      Walk 150 feet activity   Assist    Assist level: Independent      Walk 10 feet on uneven surface  activity   Assist     Assist level: Independent     Wheelchair     Assist Will patient use wheelchair at discharge?: No             Wheelchair 50 feet with 2 turns activity    Assist            Wheelchair 150 feet activity     Assist            Medical Problem List and Plan: 1.  Memory loss,  headaches, tinnitus, vestibular symptoms, insomnia and emotional liability, post-concussion syndrome  -she suffered a relatively mild brain injury 5 months ago. Most recent MRI performed two weeks ago does not reveal any abnormalities.  Type of brain trauma she sustained does not adequately explain ongoing memory deficits. ?small vascular event/infar?   -SLP -substantial cognitive deficits demonstrated  -Neuropsych assessment demonstrates substantial deficits in delayed and immediate memory, as well as attention. Results appear valid  -ritalin increased to 10mg  bid this morning. This has helped attention and ability to process information, anxiety better too  -aricept initiated last night for memory  -have asked neurology to see patient for opinion re: cause of memory deficits. HTN is adequately controlled now. MRI without obvious infarcts (I personally reviewed yesterday). No signs of PRES and any encephalopathy due to HTN should have long since resolved.   -Lab work up so far fairly unremarkable except for low cortisol level (which could be non-specific) and elevated Hgb A1C.    -repeat cortisol as outpt and if low again, consider ACTH-stim test  -EEG without any abnl activity  -can go home today after neuro eval  -needs aid at home to help with safety/home 2.  DVT Prophylaxis/Anticoagulation: ambulatory 3. Pain Management: butalbital one capsule by mouth every 6 hours as needed for headaches 4. Mood:  Pamelor 20 mg bedtime   5. Neuropsych: This patient is capable of making decisions on her own behalf. 6. Skin/Wound Care: Routine skin checks 7. Fluids/Electrolytes/Nutrition:  encourage PO  -eating well.    8. Hyperlipidemia. Lovastatin 9. Diabetes mellitus. Metformin 500 mg daily--pt states it tears up her stomach  -Hgb A1C 10.0  -dc metformin and begin glucotrol xl 5mg  daily. Further titration likely needed  -needs to check cbg's at home. Would need help potentially with this  too  -ordered cbg checks here, but not done 10. Hypertension. Benazepril 40 mg twice daily,metoprolol 50 mg twice daily  -increased metoprolol to 75mg  bid--   -under MUCH better control---headaches have improved as well. 11. Asthma. Symbicort/pro-air    LOS: 4 days A FACE TO FACE EVALUATION WAS PERFORMED  Meredith Staggers 01/05/2018, 8:31 AM

## 2018-01-05 NOTE — Progress Notes (Signed)
Occupational Therapy Discharge Summary  Patient Details  Name: Bridget Anderson MRN: [id number] Date of Birth: 01-14-1971   Patient has met 1 of 2 long term goals. Patient to discharge at overall Supervision level.  Patient's care partner is independent to provide the necessary cognitive assistance at discharge.  PT continues to have increased BP with activity pt MD aware and gave permission to work on basic ADL/iADLS with elevated BP. Medical team working to manage it. Patients exhibits behaviors consistent with a Rancho Level VIII and requires overall Mod A verbal cues for utilization of memory compensatory strategies for recall, complex problem solving and emergent awareness of errors. Patient also has demonstrated improved attention and can alternate attention between tasks with extra time.    Reasons goals not met: Pt still requires A for recall of daily events and A to setup external aids to help her follow a schedule/ recall events or responsibilities.  Recommendation:  Patient will benefit from ongoing skilled OT services in outpatient setting to continue to advance functional skills in the area of BADL.  Equipment: No equipment provided  Reasons for discharge: treatment goals met and discharge from hospital  Patient/family agrees with progress made and goals achieved: Yes  OT Discharge Precautions/Restrictions  Precautions Precaution Comments: congitive deficts - memory  Restrictions Weight Bearing Restrictions: No General   Vital Signs Therapy Vitals Temp: 98.3 F (36.8 C) Pulse Rate: 62 Resp: 17 BP: 120/82 Patient Position (if appropriate): Sitting Oxygen Therapy SpO2: 100 % O2 Device: Room Air    ADL ADL Eating: Independent Grooming: Independent Where Assessed-Grooming: Standing at sink Upper Body Bathing: Independent Lower Body Bathing: Independent Upper Body Dressing: Independent Lower Body Dressing: Independent Toileting: Independent Toilet Transfer:  Independent Toilet Transfer Method: Magazine features editor: IT consultant Method: Ambulating Vision Baseline Vision/History: Wears glasses Wears Glasses: Distance only Patient Visual Report: No change from baseline Perception  Perception: Within Functional Limits Praxis Praxis: Intact Cognition Overall Cognitive Status: Impaired/Different from baseline Arousal/Alertness: Awake/alert Orientation Level: Oriented X4 Attention: Selective;Alternating;Divided Sustained Attention: Appears intact Sustained Attention Impairment: Verbal basic;Functional basic Selective Attention: Appears intact Alternating Attention: Appears intact Divided Attention: Impaired Divided Attention Impairment: Functional basic;Verbal basic Memory: Impaired Memory Impairment: Decreased recall of new information;Decreased short term memory;Storage deficit Decreased Short Term Memory: Verbal complex;Functional complex Awareness Impairment: Emergent impairment Problem Solving: Impaired Problem Solving Impairment: Functional complex Rancho [employer] Scales of Cognitive Functioning: Purposeful/appropriate Sensation Sensation Light Touch: Appears Intact Hot/Cold: Appears Intact Stereognosis: Appears Intact Coordination Gross Motor Movements are Fluid and Coordinated: Yes Fine Motor Movements are Fluid and Coordinated: Yes Motor  Motor Motor - Discharge Observations: WFL Mobility  Transfers Sit to Stand: Independent  Trunk/Postural Assessment  Lumbar Assessment Lumbar Assessment: Within Functional Limits Postural Control Postural Control: Within Functional Limits  Balance Dynamic Standing Balance Dynamic Standing - Balance Support: During functional activity Dynamic Standing - Level of Assistance: 7: Independent Extremity/Trunk Assessment RUE Assessment RUE Assessment: Within Functional Limits LUE Assessment LUE Assessment: Within Functional Limits   Willeen Cass [facility] 01/05/2018, 8:27 AM

## 2018-01-08 ENCOUNTER — Ambulatory Visit: Payer: Self-pay | Admitting: Rehabilitative and Restorative Service Providers"

## 2018-01-08 LAB — VITAMIN B1: VITAMIN B1 (THIAMINE): 133 nmol/L (ref 66.5–200.0)

## 2018-01-09 ENCOUNTER — Telehealth: Payer: Self-pay

## 2018-01-09 NOTE — Telephone Encounter (Signed)
Wallene Huh, NCM with [facility] called requesting an order for a private duty sitter and an order for medication dispenser for pt.   Ph# [phone] Fax# [phone]

## 2018-01-09 NOTE — Telephone Encounter (Signed)
She may have both.  If an RX is needed, remind me tomorrow. THX

## 2018-01-10 LAB — METHYLMALONIC ACID, SERUM: Methylmalonic Acid, Quantitative: 100 nmol/L (ref 0–378)

## 2018-01-10 NOTE — Telephone Encounter (Signed)
Bridget Anderson has been notified.

## 2018-01-15 ENCOUNTER — Encounter: Payer: No Typology Code available for payment source | Admitting: Psychology

## 2018-01-25 ENCOUNTER — Ambulatory Visit: Payer: No Typology Code available for payment source

## 2018-01-25 ENCOUNTER — Ambulatory Visit: Payer: No Typology Code available for payment source | Admitting: Occupational Therapy

## 2018-01-25 ENCOUNTER — Ambulatory Visit: Payer: No Typology Code available for payment source | Admitting: Rehabilitative and Restorative Service Providers"

## 2018-01-25 DIAGNOSIS — R2689 Other abnormalities of gait and mobility: Secondary | ICD-10-CM

## 2018-01-25 DIAGNOSIS — M6281 Muscle weakness (generalized): Secondary | ICD-10-CM

## 2018-01-25 DIAGNOSIS — R41844 Frontal lobe and executive function deficit: Secondary | ICD-10-CM

## 2018-01-25 DIAGNOSIS — R41842 Visuospatial deficit: Secondary | ICD-10-CM

## 2018-01-25 DIAGNOSIS — R41841 Cognitive communication deficit: Secondary | ICD-10-CM

## 2018-01-25 DIAGNOSIS — R42 Dizziness and giddiness: Secondary | ICD-10-CM | POA: Insufficient documentation

## 2018-01-25 DIAGNOSIS — R2681 Unsteadiness on feet: Secondary | ICD-10-CM

## 2018-01-25 DIAGNOSIS — R41840 Attention and concentration deficit: Secondary | ICD-10-CM | POA: Insufficient documentation

## 2018-01-25 NOTE — Patient Instructions (Addendum)
Memory Compensation Strategies   1. Use "WARM" strategy. W= write it down A=  associate it R=  repeat it M=  make a mental picture  2. You can keep a Social worker. Use a 3-ring notebook with sections for the following:  calendar, important names and phone numbers, medications, doctors' names/phone numbers, "to do list"/reminders, and a section to journal what you did each day  3. Use a calendar to write appointments down.  4. Write yourself a schedule for the day.  This can be placed on the calendar or in a separate section of the Memory Notebook.  Keeping a regular schedule can help memory.  5. Use medication organizer with sections for each day or morning/evening pills  You may need help loading it  6. Keep a basket, or pegboard by the door.   Place items that you need to take out with you in the basket or on the pegboard.  You may also want to include a message board for reminders.  7. Use sticky notes. Place sticky notes with reminders in a place where the task is performed.  For example:  "turn off the stove" placed by the stove, "lock the door" placed on the door at eye level, "take your medications" on the bathroom mirror or by the place where you normally take your medications  8. Use alarms/timers.  Use while cooking to remind yourself to check on food or as a reminder to take your medicine, or as a reminder to make a call, or as a reminder to perform another task, etc.  9. Use your phone or a small tape recorder to record important information and notes for yourself.   =======================================================================================  SLP provided information found on [facility] website re: post-concussion syndrome at: ThermalCard.co.nz?p=1

## 2018-01-25 NOTE — Therapy (Signed)
[facility] [address] [place], [place], [address] Phone: [phone]   Fax:  [phone]  Occupational Therapy Evaluation  Patient Details  Name: Bridget Anderson MRN: [id number] Date of Birth: 04-24-1970 No data recorded  Encounter Date: 01/25/2018  OT End of Session - 01/25/18 1647    Visit Number  1    Number of Visits  17    Authorization Type  [employer]    Authorization - Visit Number  1    Authorization - Number of Visits  20    OT Start Time  7619    OT Stop Time  1540    OT Time Calculation (min)  55 min    Activity Tolerance  Treatment limited secondary to agitation    Behavior During Therapy  Agitated;Anxious       Past Medical History:  Diagnosis Date  . Chlamydia   . Diabetes ([place])   . GERD (gastroesophageal reflux disease)   . Hyperlipidemia   . Hypertension   . Migraine   . Obstructive sleep apnea on CPAP    pt has not worn CPAP in years  . Seasonal allergies   . Wears glasses     Past Surgical History:  Procedure Laterality Date  . ABDOMINAL HYSTERECTOMY N/A 07/16/2013   Procedure: HYSTERECTOMY ABDOMINAL;  Surgeon: Allyn Kenner, DO;  Location: [facility];  Service: Gynecology;  Laterality: N/A;  . BILATERAL SALPINGECTOMY Bilateral 07/16/2013   Procedure: BILATERAL SALPINGECTOMY;  Surgeon: Allyn Kenner, DO;  Location: [facility];  Service: Gynecology;  Laterality: Bilateral;  . CARPAL TUNNEL RELEASE  01/16/2012   Procedure: CARPAL TUNNEL RELEASE;  Surgeon: Nita Sells, MD;  Location: [facility];  Service: Orthopedics;  Laterality: Left;  . CARPAL TUNNEL RELEASE  02/20/2012   Procedure: CARPAL TUNNEL RELEASE;  Surgeon: Nita Sells, MD;  Location: [facility];  Service: Orthopedics;  Laterality: Right;  . COLONOSCOPY    . CYSTO WITH HYDRODISTENSION N/A 07/16/2013   Procedure: CYSTOSCOPY/HYDRODISTENSION;  Surgeon: Reece Packer, MD;  Location: [facility];  Service: Urology;  Laterality: N/A;  . DILATION AND CURETTAGE OF UTERUS    . FOOT SURGERY    . HYSTEROSCOPY W/D&C  10/31/2011   Procedure: DILATATION AND CURETTAGE /HYSTEROSCOPY;  Surgeon: Allyn Kenner, DO;  Location: [facility];  Service: Gynecology;  Laterality: N/A;  . LAPAROSCOPY N/A 07/16/2013   Procedure: LAPAROSCOPY DIAGNOSTIC;  Surgeon: Allyn Kenner, DO;  Location: [facility];  Service: Gynecology;  Laterality: N/A;  . PLANTAR FASCIA SURGERY    . PUBOVAGINAL SLING N/A 07/16/2013   Procedure: Gaynelle Arabian;  Surgeon: Reece Packer, MD;  Location: [facility];  Service: Urology;  Laterality: N/A;  . TUBAL LIGATION    . [place],  2003    There were no vitals filed for this visit.  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 PMH: HTN, migraines, DM    Currently in Pain?  Yes    Pain Score  7     Pain Location  Head    Pain Descriptors / Indicators  Headache    Pain Type  Acute pain    Pain Frequency  Constant    Aggravating Factors   Overstimulation    Pain Relieving Factors  rest, dark        [facility] OT Assessment - 01/25/18 0001      Assessment   Medical Diagnosis  TBI  Onset Date/Surgical Date  08/05/17    Hand Dominance  Right    Prior Therapy  Inpatient rehab 11/11 -01/05/18      Precautions   Precaution Comments  driving w/ supervision (but sister reports road range)  - therapist does not feel she is safe to drive     Restrictions   Weight Bearing Restrictions  No      Home  Environment   Bathroom Shower/Tub  Tub/Shower unit    Additional Comments  Pt lives in 1 story home w/ basement and laundry downstairs. 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#    Leisure  shopping, being with family      ADL   Eating/Feeding  Independent    Grooming  Independent    Aeronautical engineer  Supervision/safety   with 1 fall     IADL   Shopping  Needs to be accompanied on any shopping trip    Meal Prep  --   help from daughter to [employer]  --   family mostly drives pt, but pt has drvien w/ sup   Medication Management  Has difficulty remembering to take medication;Is not capable of dispensing or managing own medication    Financial Management  --   sister doing currently     Mobility   Mobility Status  Independent      Written Expression   Dominant Hand  Right      Vision - History   Baseline Vision  Wears glasses all the time    Additional Comments  reports bluriness occasionally      Vision Assessment   Ocular Range of Motion  Within Functional Limits   but reports dizziness w/ testing     Cognition   Overall Cognitive Status  Impaired/Different from baseline    Memory Impairment  Decreased recall of new information;Decreased short term memory    Awareness  Impaired    Problem Solving  Impaired    Behaviors  Restless    Cognition Comments  Pt w/ severly decreased awareness, working and short term memory deficits. Delayed recall 0/3, unable to subtract by 7's due to working memory deficits      Observation/Other Assessments   Observations  Pt easily frustrated. Pt easily overstimulated. Pt with decreased awareness into deficits and asks why she can't drive or return to work.       Coordination   9 Hole Peg Test  Right;Left    Right 9 Hole Peg Test  35.40 sec    Left 9 Hole Peg Test  33 sec    Coordination  Pt's coordination appears WFL's but slower w/ above times due to decreased ability to follow directions      Edema   Edema  none      ROM / Strength   AROM / PROM / Strength  AROM      AROM   Overall AROM Comments  RUE WNL's. LUE WFL's except  end range shoulder movement      Strength   Overall  Strength Comments  RUE 4/5, LUE 3+/5 w/ pain LUE      Hand Function   Right Hand Grip (lbs)  40 LBS    Left Hand Grip (lbs)  18 LBS       Treatment: discussion about O.T. Role in her recovery process and how it differs from other disciplines. Discussion about safety w/ cooking and making sure family member is always there to assist. Further discussion re: driving and why this therapist feels she is currently not safe to drive. Pt demo poor awareness and unable to recognize safety issues w/ driving. Pt's sister says that family is usually with her when she is driving.                  OT Short Term Goals - 01/25/18 1655      OT SHORT TERM GOAL #1   Title  Independent with HEP for LUE strength and grip strength    Time  4    Period  Weeks    Status  New      OT SHORT TERM GOAL #2   Title  Pt to verbalize understanding with memory strategies for functional tasks     Time  4    Period  Weeks    Status  New      OT SHORT TERM GOAL #3   Title  Pt to report dizziness less than 5/10 with functional tasks (loading/unloading dishwasher, laundry tasks, etc)     Time  4    Period  Weeks    Status  New      OT SHORT TERM GOAL #4   Title  Pt to perform simple familiar cooking task w/ no more than min cueing    Time  4    Period  Weeks    Status  New      OT SHORT TERM GOAL #5   Title  Pt to attend to cognitive task for 10 min. in quiet environment    Time  4    Period  Weeks    Status  New        OT Long Term Goals - 01/25/18 1658      OT LONG TERM GOAL #1   Title  Pt to improve grip strength Lt hand to 25 lbs or greater     Baseline  eval = 18 lbs     Time  8    Period  Weeks    Status  New      OT LONG TERM GOAL #2   Title  Pt to perform simple novel cooking task with min cues only    Time  8    Period  Weeks    Status  New      OT LONG TERM GOAL #3   Title  Pt to attend to cognitve task in  moderately distracting enviornment for 15 min. w/o rest    Time  8    Period  Weeks    Status  New      OT LONG TERM GOAL #4   Title  Pt to demo sufficient working memory for simple organizational/problem solving task    Time  8    Period  Weeks    Status  New      OT LONG TERM GOAL #5   Title  Pt to perform environmental scanning in busy environment w/ 90% accuracy    Time  8    Period  Weeks  Status  New            Plan - 01/25/18 1649    Clinical Impression Statement  Pt is a 47 y.o. female who presents to outpatient rehab s/p TBI on 08/05/17 after an 85# pipe fell and struck her head at work. Pt went to inpatient rehab 11/11 - 01/05/18. Pt presents today with signifcantly impaired awareness into deficits, working and short term memory. Pt also gets easily frustrated and over stimulated.  Pt also demo decreased strength LUE   Occupational Profile and client history currently impacting functional performance  Pt's life roles impacted as mother, daughter, sister, and employee. PMH: HTN, DM, migraines, asthma    Occupational performance deficits (Please refer to evaluation for details):  IADL's;Rest and Sleep;Work;Leisure;Social Participation;ADL's    Rehab Potential  Fair    Current Impairments/barriers affecting progress:  significant cognitive deficits    OT Frequency  2x / week    OT Duration  8 weeks    OT Treatment/Interventions  Self-care/ADL training;DME and/or AE instruction;Psychosocial skills training;Therapeutic activities;Cognitive remediation/compensation;Coping strategies training;Therapeutic exercise;Neuromuscular education;Passive range of motion;Functional Mobility Training;Visual/perceptual remediation/compensation;Patient/family education;Manual Therapy    Plan  putty HEP, LUE strengthening HEP, issue BI support group info   Clinical Decision Making  Several treatment options, min-mod task modification necessary    Consulted and Agree with Plan of Care   Patient;Family member/caregiver    Family Member Consulted  sister       Patient will benefit from skilled therapeutic intervention in order to improve the following deficits and impairments:  Decreased coping skills, Decreased safety awareness, Decreased endurance, Decreased activity tolerance, Decreased knowledge of precautions, Impaired UE functional use, Decreased cognition, Decreased strength, Impaired perceived functional ability, Impaired vision/preception, Decreased psychosocial skills  Visit Diagnosis: Attention and concentration deficit - Plan: Ot plan of care cert/re-cert  Frontal lobe and executive function deficit - Plan: Ot plan of care cert/re-cert  Visuospatial deficit - Plan: Ot plan of care cert/re-cert  Muscle weakness (generalized) - Plan: Ot plan of care cert/re-cert  Unsteadiness on feet - Plan: Ot plan of care cert/re-cert    Problem List Patient Active Problem List   Diagnosis Date Noted  . Anterograde amnesia   . Postconcussive syndrome 01/01/2018  . Asthma   . Essential hypertension   . Diabetes mellitus type 2 in obese ([place])   . Dyslipidemia   . Vascular headache   . Chronic post-traumatic headache, not intractable 11/20/2017  . Post concussion syndrome 11/20/2017  . Shortness of breath on exertion 05/25/2017  . Other fatigue 05/25/2017  . Type 2 diabetes mellitus without complication, without long-term current use of insulin ([place]) 05/25/2017  . Uncontrolled hypertension 05/25/2017  . Chronic migraine without aura, with intractable migraine, so stated, with status migrainosus 03/20/2017  . S/P hysterectomy 07/16/2013    Carey Bullocks, OTR/L 01/25/2018, [facility] [address] [place], [place], [address] Phone: [phone]   Fax:  [phone]  Name: DILIA ALEMANY MRN: [id number] Date of Birth: 1970-05-29
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
## 2018-01-29 ENCOUNTER — Encounter
Payer: No Typology Code available for payment source | Attending: Physical Medicine & Rehabilitation | Admitting: Physical Medicine & Rehabilitation

## 2018-01-29 ENCOUNTER — Encounter: Payer: Self-pay | Admitting: Physical Medicine & Rehabilitation

## 2018-01-29 VITALS — BP 192/105 | HR 55 | Ht 66.0 in | Wt 220.0 lb

## 2018-01-29 DIAGNOSIS — Z90710 Acquired absence of both cervix and uterus: Secondary | ICD-10-CM | POA: Diagnosis not present

## 2018-01-29 DIAGNOSIS — E785 Hyperlipidemia, unspecified: Secondary | ICD-10-CM | POA: Insufficient documentation

## 2018-01-29 DIAGNOSIS — G4700 Insomnia, unspecified: Secondary | ICD-10-CM | POA: Insufficient documentation

## 2018-01-29 DIAGNOSIS — F0781 Postconcussional syndrome: Secondary | ICD-10-CM | POA: Diagnosis not present

## 2018-01-29 DIAGNOSIS — G441 Vascular headache, not elsewhere classified: Secondary | ICD-10-CM

## 2018-01-29 DIAGNOSIS — G43909 Migraine, unspecified, not intractable, without status migrainosus: Secondary | ICD-10-CM | POA: Insufficient documentation

## 2018-01-29 DIAGNOSIS — Z8349 Family history of other endocrine, nutritional and metabolic diseases: Secondary | ICD-10-CM | POA: Diagnosis not present

## 2018-01-29 DIAGNOSIS — I10 Essential (primary) hypertension: Secondary | ICD-10-CM

## 2018-01-29 DIAGNOSIS — H9319 Tinnitus, unspecified ear: Secondary | ICD-10-CM | POA: Insufficient documentation

## 2018-01-29 DIAGNOSIS — Z8249 Family history of ischemic heart disease and other diseases of the circulatory system: Secondary | ICD-10-CM | POA: Diagnosis not present

## 2018-01-29 DIAGNOSIS — E119 Type 2 diabetes mellitus without complications: Secondary | ICD-10-CM | POA: Diagnosis not present

## 2018-01-29 DIAGNOSIS — K219 Gastro-esophageal reflux disease without esophagitis: Secondary | ICD-10-CM | POA: Insufficient documentation

## 2018-01-29 DIAGNOSIS — G4733 Obstructive sleep apnea (adult) (pediatric): Secondary | ICD-10-CM | POA: Diagnosis not present

## 2018-01-29 DIAGNOSIS — R413 Other amnesia: Secondary | ICD-10-CM | POA: Insufficient documentation

## 2018-01-29 DIAGNOSIS — G44329 Chronic post-traumatic headache, not intractable: Secondary | ICD-10-CM

## 2018-01-29 MED ORDER — METOPROLOL SUCCINATE ER 100 MG PO TB24: 100.0000 mg | ORAL_TABLET | Freq: Every day | ORAL | 4 refills | Status: AC

## 2018-01-29 MED ORDER — METHYLPHENIDATE HCL ER (LA) 20 MG PO CP24: 20.0000 mg | ORAL_CAPSULE | ORAL | 0 refills | Status: DC

## 2018-01-29 NOTE — Patient Instructions (Signed)
ESTABLISH A ROUTINE WITH ACTIVITIES YOU DO EACH DAY, WITH YOUR MEDICATIONS AND HOW/WHEN YOU TAKE THEM, WITH YOUR DIET/MONITORING OF BLOODS SUGARS, ROUTINE SLEEP.   KEEP A WEEKLY/DAILY CALENDAR TO USE TO HELP REMIND YOU OF ACTIVITIES THAT YOU NEED TO DO AT HOME, USE FOR KEEPING IMPORTANT DATES, APPOINTMENTS.

## 2018-01-29 NOTE — Progress Notes (Signed)
Subjective:    Patient ID: Bridget Anderson, female    DOB: May 04, 1970, 47 y.o.   MRN: [id number]  HPI   Yasmeen is here in follow up of her PCS. She has been home. WC hired an Administrator, arts. The aid apparently never showed up. Tamana's sister has done the best she can but has had to continue working as well. Her sister has finally secured someone help with her meds and safety during the day Zigmund Daniel).   As a result of the only intermittent help at home she has not been taking her medications regularly.  Brielle again asked me today whether she can go back to work.  She does state that she recalls me from before which is new.  She became frustrated w she ith her sister in the office today as she felt that she was trying to hold her back.  I asked how sugars were doing at home and was told that they have been elevated, with a recent a.m. CBG was 191.  Blood pressure has been up as well.  She has been receiving some therapy.  She has neuro rehab scheduled later this month.  In discussing medications today, sister feels that she noticed a big difference when she is using her Ritalin.  I had seen the same while in the hospital.  I asked the patient if she remembered her hospitalization and she did not frankly recall it.     Pain Inventory Average Pain 0 Pain Right Now 0 My pain is no pain  In the last 24 hours, has pain interfered with the following? General activity 0 Relation with others 0 Enjoyment of life 0 What TIME of day is your pain at its worst? no pain Sleep (in general) Poor  Pain is worse with: inactivity and unsure Pain improves with: rest Relief from Meds: n/a  Mobility walk without assistance ability to climb steps?  yes do you drive?  yes Do you have any goals in this area?  no  Function employed # of hrs/week 36-48 what is your job? [employer]  Neuro/Psych No problems in this area  Prior Studies Any changes since last visit?  no  Physicians involved in your care Any changes since  last visit?  no   Family History  Problem Relation Age of Onset  . Hypertension Mother   . Hyperlipidemia Mother   . Hypertension Father   . Hyperlipidemia Father   . Thyroid disease Father   . Hypertension Brother    Social History   Socioeconomic History  . Marital status: Single    Spouse name: Not on file  . Number of children: 2  . Years of education: Not on file  . Highest education level: Associate degree: academic program  Occupational History  . Occupation: Administrator, arts  Social Needs  . Financial resource strain: Not on file  . Food insecurity:    Worry: Not on file    Inability: Not on file  . Transportation needs:    Medical: Not on file    Non-medical: Not on file  Tobacco Use  . Smoking status: Never Smoker  . Smokeless tobacco: Never Used  Substance and Sexual Activity  . Alcohol use: Yes    Comment: Rarely  . Drug use: No  . Sexual activity: Yes    Birth control/protection: Surgical  Lifestyle  . Physical activity:    Days per week: Not on file    Minutes per session: Not on file  . Stress:  Not on file  Relationships  . Social connections:    Talks on phone: Not on file    Gets together: Not on file    Attends religious service: Not on file    Active member of club or organization: Not on file    Attends meetings of clubs or organizations: Not on file    Relationship status: Not on file  Other Topics Concern  . Not on file  Social History Narrative   Lives at home with her daughter   Right handed   Drinks 3 cups of caffeine daily   Past Surgical History:  Procedure Laterality Date  . ABDOMINAL HYSTERECTOMY N/A 07/16/2013   Procedure: HYSTERECTOMY ABDOMINAL;  Surgeon: Allyn Kenner, DO;  Location: [facility];  Service: Gynecology;  Laterality: N/A;  . BILATERAL SALPINGECTOMY Bilateral 07/16/2013   Procedure: BILATERAL SALPINGECTOMY;  Surgeon: Allyn Kenner, DO;  Location: [facility];  Service: Gynecology;  Laterality: Bilateral;  . CARPAL  TUNNEL RELEASE  01/16/2012   Procedure: CARPAL TUNNEL RELEASE;  Surgeon: Nita Sells, MD;  Location: [facility];  Service: Orthopedics;  Laterality: Left;  . CARPAL TUNNEL RELEASE  02/20/2012   Procedure: CARPAL TUNNEL RELEASE;  Surgeon: Nita Sells, MD;  Location: [facility];  Service: Orthopedics;  Laterality: Right;  . COLONOSCOPY    . CYSTO WITH HYDRODISTENSION N/A 07/16/2013   Procedure: CYSTOSCOPY/HYDRODISTENSION;  Surgeon: Reece Packer, MD;  Location: [facility];  Service: Urology;  Laterality: N/A;  . DILATION AND CURETTAGE OF UTERUS    . FOOT SURGERY    . HYSTEROSCOPY W/D&C  10/31/2011   Procedure: DILATATION AND CURETTAGE /HYSTEROSCOPY;  Surgeon: Allyn Kenner, DO;  Location: [facility];  Service: Gynecology;  Laterality: N/A;  . LAPAROSCOPY N/A 07/16/2013   Procedure: LAPAROSCOPY DIAGNOSTIC;  Surgeon: Allyn Kenner, DO;  Location: [facility];  Service: Gynecology;  Laterality: N/A;  . PLANTAR FASCIA SURGERY    . PUBOVAGINAL SLING N/A 07/16/2013   Procedure: Gaynelle Arabian;  Surgeon: Reece Packer, MD;  Location: [facility];  Service: Urology;  Laterality: N/A;  . TUBAL LIGATION    . VAGINAL DELIVERY  1995,  2003   Past Medical History:  Diagnosis Date  . Chlamydia   . Diabetes ([place])   . GERD (gastroesophageal reflux disease)   . Hyperlipidemia   . Hypertension   . Migraine   . Obstructive sleep apnea on CPAP    pt has not worn CPAP in years  . Seasonal allergies   . Wears glasses    BP (!) 192/105 (BP Location: Left Arm, Patient Position: Sitting, Cuff Size: Normal)   Pulse (!) 55   Ht 5\' 6"  (1.676 m)   Wt 220 lb (99.8 kg)   LMP 06/27/2013   SpO2 98%   BMI 35.51 kg/m   Opioid Risk Score:   Fall Risk Score:  `1  Depression screen PHQ 2/9  Depression screen [facility] 2/9 11/20/2017 05/25/2017  Decreased Interest 2 3  Down, Depressed, Hopeless 2 1  PHQ - 2 Score 4 4  Altered sleeping 3 2  Tired, decreased energy 2 3   Change in appetite 2 1  Feeling bad or failure about yourself  3 0  Trouble concentrating 3 3  Moving slowly or fidgety/restless 2 0  Suicidal thoughts 1 0  PHQ-9 Score 20 13  Difficult doing work/chores Extremely dIfficult Not difficult at all    Review of Systems  Constitutional: Negative.   HENT: Negative.   Eyes:  Negative.   Respiratory: Negative.   Cardiovascular: Negative.   Gastrointestinal: Negative.   Endocrine: Negative.   Genitourinary: Negative.   Musculoskeletal: Negative.   Skin: Negative.   Allergic/Immunologic: Negative.   Neurological: Negative.   Hematological: Negative.   Psychiatric/Behavioral: Negative.   All other systems reviewed and are negative.      Objective:   Physical Exam General: No acute distress HEENT: EOMI, oral membranes moist Cards: reg rate  Chest: normal effort Abdomen: Soft, NT, ND Skin: dry, intact Extremities: no edema  Neuro:  Patient alert to self.  Seems to recognize me today.  Did not really remember the hospital stay.  I asked her what she had for breakfast this morning her dinner last night and patient could not tell me.  Lacks insight and awareness.  At the same time she asked when she can go back to work and when I told her she could not, she asked why.  Language is intact.  Strength is generally normal.  Patient became more irritated today when challenging her from a cognitive/behavioral standpoint. Musculoskeletal:Functional neck range of motion.  No deformities of the head.  Transferred easily from sit to stand.Marland Kitchen Psych:Became irritated at times as noted above.  Overall cooperative however.     Assessment & Plan:  1. Post concussionsyndrome with ongoing headaches, memory loss, tinnitus, vestibular symptoms, insomnia and emotional lability 2.History of migraine headaches since her middle school years. Patient had been seen twice by neurology here in [place]. Apparently she has been on numerous  medications in the past for prophylaxis and treatment 3. Uncontrolled hypertension  4. DM II  Plan: 1.    Unfortunately while we had made some progress on inpatient rehab, things seem to have regressed at home.  Part of this is because no one ever came out to the house through [employer] to provide assistance at home.  The sister is doing the best she can given her workload and limitations of availability.  Sister now has a friend who she will hire to provide assistance at home.  We discussed at length today ways that we can make things more consistent and easier.  To start with she needs to keep a regular calendar that she can use excuse for things she is doing each day of the week and for appointments, etc.  Something such as a white board on the kitchen which she can refer to on a daily basis would be very helpful.  Initially she will need a caregiver to help cue her with this.  Hopefully over time it will become more automatic for her. 2.    Sister is working on a med dispensing system to help make medications easier to remember.  Along these lines I change the metoprolol from 75 twice daily to 100 mg XL.  Also change Ritalin to the ER form 20 mg daily.  All meds will be either in the morning or at bedtime now. 3.    Resume outpatient therapies.   4. Continue Aricept for memory 5.    Discussed the importance of better glucose control as well as blood pressure control.  Hopefully she is taking her medications as prescribed these pieces will fall in place..  6.  I had a fairly upfront discussion with the patient regarding her ability to return to work.  Tried to provide her some reasons and tangible evidence as to why she is unable to do so at this point.  Unfortunately I do not have a reasonable  idea when and if she will be able to return to work..    1 of the problems here as well is that the other family members do not see need to be buying into what is happening and are allowing the patient  to do things that she should not be doing and arm providing supervision and care that they need to be. 7.  Follow-up with me in about 1 month's time.  25 minutes were spent today.

## 2018-02-02 ENCOUNTER — Ambulatory Visit: Payer: Self-pay | Admitting: Physical Therapy

## 2018-02-05 ENCOUNTER — Ambulatory Visit: Payer: No Typology Code available for payment source | Admitting: Physical Therapy

## 2018-02-05 ENCOUNTER — Encounter: Payer: Self-pay | Admitting: Physical Therapy

## 2018-02-05 VITALS — BP 190/98

## 2018-02-05 DIAGNOSIS — R2689 Other abnormalities of gait and mobility: Secondary | ICD-10-CM

## 2018-02-05 DIAGNOSIS — R42 Dizziness and giddiness: Secondary | ICD-10-CM

## 2018-02-05 DIAGNOSIS — R2681 Unsteadiness on feet: Secondary | ICD-10-CM

## 2018-02-06 ENCOUNTER — Encounter: Payer: Self-pay | Admitting: Physical Therapy

## 2018-02-06 NOTE — Therapy (Signed)
[facility] [address] [place], [place], [address] Phone: [phone]   Fax:  [phone]  Physical Therapy Evaluation  Patient Details  Name: Bridget Anderson MRN: [id number] Date of Birth: 07/20/1970 Referring Provider (PT): Dr. Oval Linsey   Encounter Date: 02/05/2018  PT End of Session - 02/06/18 1951    Visit Number  1   New episode of care 02/05/18   Number of Visits  13    Date for PT Re-Evaluation  04/06/18    Authorization Type  Workers Comp    PT Start Time  1320    PT Stop Time  1401    PT Time Calculation (min)  41 min    Activity Tolerance  --   questions multiple times why she is here for therapy   Behavior During Therapy  Agitated;Anxious       Past Medical History:  Diagnosis Date  . Chlamydia   . Diabetes ([place])   . GERD (gastroesophageal reflux disease)   . Hyperlipidemia   . Hypertension   . Migraine   . Obstructive sleep apnea on CPAP    pt has not worn CPAP in years  . Seasonal allergies   . Wears glasses     Past Surgical History:  Procedure Laterality Date  . ABDOMINAL HYSTERECTOMY N/A 07/16/2013   Procedure: HYSTERECTOMY ABDOMINAL;  Surgeon: Allyn Kenner, DO;  Location: [facility];  Service: Gynecology;  Laterality: N/A;  . BILATERAL SALPINGECTOMY Bilateral 07/16/2013   Procedure: BILATERAL SALPINGECTOMY;  Surgeon: Allyn Kenner, DO;  Location: [facility];  Service: Gynecology;  Laterality: Bilateral;  . CARPAL TUNNEL RELEASE  01/16/2012   Procedure: CARPAL TUNNEL RELEASE;  Surgeon: Nita Sells, MD;  Location: [facility];  Service: Orthopedics;  Laterality: Left;  . CARPAL TUNNEL RELEASE  02/20/2012   Procedure: CARPAL TUNNEL RELEASE;  Surgeon: Nita Sells, MD;  Location: [facility];  Service: Orthopedics;  Laterality: Right;  . COLONOSCOPY    . CYSTO WITH HYDRODISTENSION N/A 07/16/2013   Procedure: CYSTOSCOPY/HYDRODISTENSION;   Surgeon: Reece Packer, MD;  Location: [facility];  Service: Urology;  Laterality: N/A;  . DILATION AND CURETTAGE OF UTERUS    . FOOT SURGERY    . HYSTEROSCOPY W/D&C  10/31/2011   Procedure: DILATATION AND CURETTAGE /HYSTEROSCOPY;  Surgeon: Allyn Kenner, DO;  Location: [facility];  Service: Gynecology;  Laterality: N/A;  . LAPAROSCOPY N/A 07/16/2013   Procedure: LAPAROSCOPY DIAGNOSTIC;  Surgeon: Allyn Kenner, DO;  Location: [facility];  Service: Gynecology;  Laterality: N/A;  . PLANTAR FASCIA SURGERY    . PUBOVAGINAL SLING N/A 07/16/2013   Procedure: Gaynelle Arabian;  Surgeon: Reece Packer, MD;  Location: [facility];  Service: Urology;  Laterality: N/A;  . TUBAL LIGATION    . [address],  2003    Vitals:   02/05/18 1327  BP: (!) 190/98     Subjective Assessment - 02/06/18 1940    Subjective  Reports having headaches all the time.  Thinks she has had a fall, but she is not sure.    Patient is accompained by:  Family member   Daughter   Pertinent History  Pt hit by 85# pipe at work June 2019, post-concussive syndrome,memory deficits    Patient Stated Goals  Pt's goal is to stop hurting (02/05/18) (regarding headaches)    Currently in Pain?  Yes    Pain Score  5     Pain Location  Head  Pain Orientation  Upper    Pain Descriptors / Indicators  Headache    Pain Type  Acute pain    Pain Onset  --   have them all the time   Pain Frequency  Constant    Aggravating Factors   getting up, doing a lot of things    Pain Relieving Factors  resting/sleeping, darkness, pain medication         [facility] PT Assessment - 02/06/18 1942      Assessment   Medical Diagnosis  TBI    Referring Provider (PT)  Dr. Oval Linsey    Onset Date/Surgical Date  08/05/17    Prior Therapy  Outpatient PT eval 12/05/17; then patient admitted for Inpatient rehab 11/11 -01/05/18      Precautions   Precautions  Fall      Balance Screen   Has the patient fallen in the past 6 months  Yes    How  many times?  1    Has the patient had a decrease in activity level because of a fear of falling?   No    Is the patient reluctant to leave their home because of a fear of falling?   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#    Leisure  shopping, being with family      Cognition   Overall Cognitive Status  Impaired/Different from baseline      Posture/Postural Control   Posture/Postural Control  Postural limitations    Postural Limitations  Rounded Shoulders;Forward head      Transfers   Transfers  Sit to Stand;Stand to Sit    Sit to Stand  6: Modified independent (Device/Increase time);With upper extremity assist;From chair/3-in-1    Stand to Sit  6: Modified independent (Device/Increase time);With upper extremity assist;To chair/3-in-1      Ambulation/Gait   Ambulation/Gait  Yes    Ambulation/Gait Assistance  5: Supervision    Ambulation Distance (Feet)  150 Feet    Assistive device  None    Gait Pattern  Step-through pattern;Decreased arm swing - right;Decreased arm swing - left   Slowed overall gait   Ambulation Surface  Level;Indoor      Standardized Balance Assessment   Standardized Balance Assessment  Timed Up and Go Test      Dynamic Gait Index   Level Surface  Moderate Impairment    Change in Gait Speed  Mild Impairment    Gait with Horizontal Head Turns  Moderate Impairment    Gait with Vertical Head Turns  Mild Impairment   veers to L   Gait and Pivot Turn  Moderate Impairment    Step Over Obstacle   Moderate Impairment    Step Around Obstacles  Moderate Impairment    Steps  Mild Impairment    Total Score  11  DGI comment:  Scores <19/24 indicate increased fall risk      Timed Up and Go Test   Normal TUG (seconds)  14.78    TUG Comments  Scores >13.5 seconds indicate increased fall risk.           Vestibular Assessment - 02/06/18 1949      Vestibular Assessment   General Observation  wears glasses daily      Symptom Behavior   Type of Dizziness  Spinning    Frequency of Dizziness  when headache pain gets really bad    Aggravating Factors  Comment   when headaches get really bad   Relieving Factors  Head stationary      Occulomotor Exam   Smooth Pursuits  Intact   increased headache with side to sid motion     Vestibulo-Occular Reflex   VOR 1 Head Only (x 1 viewing)  --   difficulty dissociating head and eye movement         Objective measurements completed on examination: See above findings.              PT Education - 02/06/18 1950    Education Details  Educated patient and daughter in eval results, including pt being at fall risk per DGI and TUG scores; discussed POC, including therapyto address balance, but to also address post-concussive type symptoms    Person(s) Educated  Patient;Child(ren)    Methods  Explanation    Comprehension  Need further instruction       PT Short Term Goals - 02/06/18 2004      PT SHORT TERM GOAL #1   Title  Patient to be independent with HEP for vestibualr rehab and headache symptoms and balance.  TARGET 03/02/18    Time  4    Period  Weeks    Status  New    Target Date  03/02/18      PT SHORT TERM GOAL #2   Title  Pt will improve DGI score to at least 15/24 for decreased fall risk.    Time  4    Period  Weeks    Status  New    Target Date  03/02/18      PT SHORT TERM GOAL #3   Title  Pt will improve TUG score to less than or equal to 13.5 seconds for decreased fall risk.    Time  4    Period  Weeks     Status  New    Target Date  03/02/18      PT SHORT TERM GOAL #4   Title  Pt/family will verbalize understanding of fall prevention in home environment.    Time  4    Period  Weeks    Status  New    Target Date  03/02/18        PT Long Term Goals - 02/06/18 2006      PT LONG TERM GOAL #1   Title  patient to be independent with advanced HEP to address vestibular system and balance.  TARGET 03/16/18    Time  6    Period  Weeks    Status  New    Target Date  03/16/18      PT LONG TERM GOAL #2   Title  patient to report reduction in headache frequency, intensity, duration by >/= 50%, for improved participation in ADLs.    Time  6    Period  Weeks    Status  New    Target Date  03/16/18      PT LONG TERM GOAL #3   Title  patient to improve DGI to at least 19/24 for demonstrating reduced fall risk    Time  6    Period  Weeks    Status  New    Target Date  03/16/18      PT LONG TERM GOAL #4   Title  Pt will ambulate at least 1000 ft, indoors and outdoors, independently, no LOB, for return to community gait activities.    Time  6    Period  Weeks    Status  New    Target Date  03/16/18             Plan - 02/06/18 1953    Clinical Impression Statement  Pt is a 47 year old female seen for OPPT evaluation with diagnosis of TBI, post-concussive syndrome.  She was evaluated in October 2019, but did not return to therapy and subsequently was admitted to inpatient rehab, staying in inpatient rehab 01/01/18-01/05/18.  Pt's initial injury was June 2019, when she was struck in the head by an 85# pipe at work.  She presents with constant headaches, spinning sensation, with decreased balance, decreased independence and safety with gait.  She has had one fall in past 6 months, and she is at fall risk per DGI and TUG scores.  Pt would benefit from skilled PT to address the above stated deficits to assist with safe return to independent mobility and to decrease risk of falls.     History and Personal Factors relevant to plan of care:  PMH>3 co-morbidities, including DM, HTN, migraine; concussion/memory loss/need for family assistance for IADLs; pt was independent prior to injury    Clinical Presentation  Evolving    Clinical Presentation due to:  at fall risk per TUG and DGI scores; hx of 1 fall in past 6 months    Clinical Decision Making  Moderate    Rehab Potential  Good    Clinical Impairments Affecting Rehab Potential  Severity of (cognitive) deficits following injury; family involvement (sister working, has possible hired caregiver for home)    PT Frequency  Other (comment)   3x/wk for 1 week, then 2x/wk for 5 weeks   PT Treatment/Interventions  ADLs/Self Care Home Management;Gait training;Functional mobility training;Therapeutic activities;Therapeutic exercise;Patient/family education;Neuromuscular re-education;Balance training;Canalith Repostioning;Visual/perceptual remediation/compensation;Vestibular    PT Next Visit Plan  Check gait velocity; further vestibular assessment; begin HEP to address balance, vestibular issues    Consulted and Agree with Plan of Care  Patient;Family member/caregiver    Family Member Consulted  older daughter       Patient will benefit from skilled therapeutic intervention in order to improve the following deficits and impairments:  Abnormal gait, Decreased balance, Decreased safety awareness, Decreased mobility, Difficulty walking, Postural dysfunction, Dizziness, Decreased activity tolerance  Visit Diagnosis: Unsteadiness on feet  Other abnormalities of gait and mobility  Dizziness and giddiness     Problem List Patient Active Problem List   Diagnosis Date Noted  . Anterograde amnesia   . Postconcussive syndrome 01/01/2018  . Asthma   . Essential hypertension   . Diabetes mellitus type 2 in obese ([place])   . Dyslipidemia   . Vascular headache   . Chronic post-traumatic headache, not intractable 11/20/2017  . Post  concussion syndrome 11/20/2017  . Shortness of breath on exertion 05/25/2017  . Other fatigue 05/25/2017  . Type 2 diabetes mellitus without complication, without  long-term current use of insulin ([place]) 05/25/2017  . Uncontrolled hypertension 05/25/2017  . Chronic migraine without aura, with intractable migraine, so stated, with status migrainosus 03/20/2017  . S/P hysterectomy 07/16/2013    Sherelle Castelli W. 02/06/2018, 8:10 PM  Frazier Butt., PT   [facility] [address] [place], [place], [address] Phone: [phone]   Fax:  [phone]  Name: Bridget Anderson MRN: [id number] Date of Birth: [birth year]

## 2018-02-07 ENCOUNTER — Telehealth: Payer: Self-pay

## 2018-02-07 ENCOUNTER — Ambulatory Visit: Payer: No Typology Code available for payment source | Admitting: Physical Therapy

## 2018-02-07 ENCOUNTER — Ambulatory Visit: Payer: No Typology Code available for payment source

## 2018-02-07 ENCOUNTER — Ambulatory Visit: Payer: No Typology Code available for payment source | Admitting: Occupational Therapy

## 2018-02-07 NOTE — Telephone Encounter (Signed)
Called and left message with patient's sister re: missed P.T. and speech appointments today and reminded her of O.T. appointment at 1:15. Left call back number

## 2018-02-09 ENCOUNTER — Encounter: Payer: Self-pay | Admitting: Physical Therapy

## 2018-02-09 ENCOUNTER — Ambulatory Visit: Payer: No Typology Code available for payment source | Attending: Internal Medicine | Admitting: Physical Therapy

## 2018-02-09 ENCOUNTER — Ambulatory Visit: Payer: No Typology Code available for payment source

## 2018-02-09 DIAGNOSIS — R2681 Unsteadiness on feet: Secondary | ICD-10-CM | POA: Diagnosis not present

## 2018-02-09 DIAGNOSIS — R42 Dizziness and giddiness: Secondary | ICD-10-CM | POA: Insufficient documentation

## 2018-02-09 DIAGNOSIS — R41841 Cognitive communication deficit: Secondary | ICD-10-CM

## 2018-02-09 DIAGNOSIS — R2689 Other abnormalities of gait and mobility: Secondary | ICD-10-CM | POA: Insufficient documentation

## 2018-02-09 NOTE — Patient Instructions (Addendum)
    Memory Compensation Strategies   1. Use "WARM" strategy. W= write it down A=  associate it R=  repeat it M=  make a mental picture  2. You can keep a Social worker. Use a 3-ring notebook with sections for the following:  calendar, important names and phone numbers, medications, doctors' names/phone numbers, "to do list"/reminders, and a section to journal what you did each day  Sections:   - calendar with appointments   - homework for therapy   - PT exercises   - to-do list   - journal of daily activities that you would write down  3. Use a calendar to write appointments down.  4. Write yourself a schedule for the day.  This can be placed on the calendar or in a separate section of the Memory Notebook.  Keeping a regular schedule can help memory.  5. Use medication organizer with sections for each day or morning/evening pills  You may need help loading it  6. Keep a basket, or pegboard by the door.   Place items that you need to take out with you in the basket or on the pegboard.  You may also want to include a message board for reminders.  7. Use sticky notes. Place sticky notes with reminders in a place where the task is performed.  For example:  "turn off the stove" placed by the stove, "lock the door" placed on the door at eye level, "take your medications" on the bathroom mirror or by the place where you normally take your medications  8. Use alarms/timers.  Use while cooking to remind yourself to check on food or as a reminder to take your medicine, or as a reminder to make a call, or as a reminder to perform another task, etc.  9. Use a small tape recorder or a recorder app to record important information and notes for yourself.

## 2018-02-09 NOTE — Therapy (Signed)
[facility] [address] [place], [place], [address] Phone: [phone]   Fax:  [phone]  Speech Language Pathology Treatment  Patient Details  Name: Bridget Anderson MRN: [id number] Date of Birth: [birth year] Referring Provider (SLP): Alger Simons, MD   Encounter Date: 02/09/2018  End of Session - 02/09/18 1225    Visit Number  2    Number of Visits  25    Date for SLP Re-Evaluation  04/19/18    Authorization - Visit Number  1    Authorization - Number of Visits  25    SLP Start Time  0347    SLP Stop Time   1146    SLP Time Calculation (min)  18 min    Activity Tolerance  Patient tolerated treatment well       Past Medical History:  Diagnosis Date  . Chlamydia   . Diabetes ([place])   . GERD (gastroesophageal reflux disease)   . Hyperlipidemia   . Hypertension   . Migraine   . Obstructive sleep apnea on CPAP    pt has not worn CPAP in years  . Seasonal allergies   . Wears glasses     Past Surgical History:  Procedure Laterality Date  . ABDOMINAL HYSTERECTOMY N/A 07/16/2013   Procedure: HYSTERECTOMY ABDOMINAL;  Surgeon: Allyn Kenner, DO;  Location: [facility];  Service: Gynecology;  Laterality: N/A;  . BILATERAL SALPINGECTOMY Bilateral 07/16/2013   Procedure: BILATERAL SALPINGECTOMY;  Surgeon: Allyn Kenner, DO;  Location: [facility];  Service: Gynecology;  Laterality: Bilateral;  . CARPAL TUNNEL RELEASE  01/16/2012   Procedure: CARPAL TUNNEL RELEASE;  Surgeon: Nita Sells, MD;  Location: [facility];  Service: Orthopedics;  Laterality: Left;  . CARPAL TUNNEL RELEASE  02/20/2012   Procedure: CARPAL TUNNEL RELEASE;  Surgeon: Nita Sells, MD;  Location: [facility];  Service: Orthopedics;  Laterality: Right;  . COLONOSCOPY    . CYSTO WITH HYDRODISTENSION N/A 07/16/2013   Procedure: CYSTOSCOPY/HYDRODISTENSION;  Surgeon: Reece Packer, MD;  Location: [facility];   Service: Urology;  Laterality: N/A;  . DILATION AND CURETTAGE OF UTERUS    . FOOT SURGERY    . HYSTEROSCOPY W/D&C  10/31/2011   Procedure: DILATATION AND CURETTAGE /HYSTEROSCOPY;  Surgeon: Allyn Kenner, DO;  Location: [facility];  Service: Gynecology;  Laterality: N/A;  . LAPAROSCOPY N/A 07/16/2013   Procedure: LAPAROSCOPY DIAGNOSTIC;  Surgeon: Allyn Kenner, DO;  Location: [facility];  Service: Gynecology;  Laterality: N/A;  . PLANTAR FASCIA SURGERY    . PUBOVAGINAL SLING N/A 07/16/2013   Procedure: Gaynelle Arabian;  Surgeon: Reece Packer, MD;  Location: [facility];  Service: Urology;  Laterality: N/A;  . TUBAL LIGATION    . [place],  2003    There were no vitals filed for this visit.  Subjective Assessment - 02/09/18 1130    Subjective  Pt arrived at 1125 for [address].     Currently in Pain?  Yes    Pain Score  8     Pain Location  Head    Pain Orientation  Upper    Pain Descriptors / Indicators  Headache    Pain Type  Acute pain    Pain Onset  More than a month ago    Pain Frequency  Constant    Aggravating Factors   light, walking "pretty much anything"    Pain Relieving Factors  meds, sleep     Effect of  Pain on Daily Activities  "makes it hard to do everything"            ADULT SLP TREATMENT - 02/09/18 1134      General Information   Behavior/Cognition  Distractible;Cooperative;Alert      Treatment Provided   Treatment provided  Cognitive-Linquistic      Cognitive-Linquistic Treatment   Treatment focused on  Cognition    Skilled Treatment  SLP reviewed last session with pt and SLP told pt that since she's having memory problems a 3-ring binder may help her. SLP educated pt with memory strategies and went over these with pt. SLP printed out memory tips and provided to pt.       Assessment / Recommendations / Plan   Plan  Continue with current plan of care      Progression Toward Goals   Progression toward goals  Progressing toward goals        SLP Education - 02/09/18 1225    Education Details  memory tips, 3-ring binder for memory notebook    Person(s) Educated  Patient    Methods  Explanation;Handout    Comprehension  Verbalized understanding;Need further instruction       SLP Short Term Goals - 02/09/18 1227      SLP SHORT TERM GOAL #1   Title  pt will demo use of memory enhancing system outside of ST session (i.e., writing down items/entries in a memory journal) between 8 sessions to enhance or provide a means of functional memory for pt    Time  6    Period  Weeks    Status  On-going      SLP SHORT TERM GOAL #2   Title  pt will tell SLP 3 practical ramifications of her cognitive deficits with modified independence (i.e., written notes) in 4 sessions    Time  6    Period  Weeks    Status  On-going      SLP SHORT TERM GOAL #3   Title  pt will give adequate synopsis of salient written material she enjoys with modified independence over two sessions    Time  4    Period  Weeks    Status  On-going       SLP Long Term Goals - 02/09/18 1230      SLP LONG TERM GOAL #1   Title  pt will demo appropriate usage of a memory enhancement/compensation system between 10 ST sessions    Time  12    Period  Weeks    Status  On-going       Plan - 02/09/18 1227    Clinical Impression Statement  Pt presents with significant/profound memory deficits (memory encoding?) - pt stated "It was nice to meet you today", when she met SLP for ST eval. Decr'd intellectual awareness for deficits (except memory), decr'd insight into deficits, decr'd problem solving, decr'd safety judgment/awareness, and orientation. SLP believes pt's sister drove pt to appointments today. Both SLP and OT cont to agree that pt should not drive. Pt would benefit from skilled ST to develop a system to enhance her memory so that she can increase her independence and her quality of life. Pt told SLP during eval "I feel like I'm lost - not remembering things  upsets me a lot."    Speech Therapy Frequency  2x / week    Duration  --   12 weeks/25 sessions   Treatment/Interventions  Compensatory strategies;Patient/family education;Functional tasks;Cueing hierarchy;Cognitive reorganization;Environmental controls;Internal/external aids;SLP  instruction and feedback    Potential to Achieve Goals  Fair    Potential Considerations  Severity of impairments;Pain level    Consulted and Agree with Plan of Care  Patient       Patient will benefit from skilled therapeutic intervention in order to improve the following deficits and impairments:   Cognitive communication deficit    Problem List Patient Active Problem List   Diagnosis Date Noted  . Anterograde amnesia   . Postconcussive syndrome 01/01/2018  . Asthma   . Essential hypertension   . Diabetes mellitus type 2 in obese ([place])   . Dyslipidemia   . Vascular headache   . Chronic post-traumatic headache, not intractable 11/20/2017  . Post concussion syndrome 11/20/2017  . Shortness of breath on exertion 05/25/2017  . Other fatigue 05/25/2017  . Type 2 diabetes mellitus without complication, without long-term current use of insulin ([place]) 05/25/2017  . Uncontrolled hypertension 05/25/2017  . Chronic migraine without aura, with intractable migraine, so stated, with status migrainosus 03/20/2017  . S/P hysterectomy 07/16/2013    [facility] ,MS, CCC-SLP  02/09/2018, 12:34 PM  [facility] [address] [place], [place], [address] Phone: [phone]   Fax:  [phone]   Name: Bridget Anderson MRN: [id number] Date of Birth: 1970/06/07

## 2018-02-09 NOTE — Therapy (Signed)
[facility] [address] [place], [place], [address] Phone: [phone]   Fax:  [phone]  Physical Therapy Treatment  Patient Details  Name: Bridget Anderson MRN: [id number] Date of Birth: [birth year] Referring Provider (PT): Dr. Oval Linsey   Encounter Date: 02/09/2018  PT End of Session - 02/09/18 1254    Visit Number  2   New episode of care 02/05/18   Number of Visits  13    Date for PT Re-Evaluation  04/06/18    Authorization Type  Workers Comp    PT Start Time  1153    PT Stop Time  1235    PT Time Calculation (min)  42 min    Activity Tolerance  Patient limited by pain    Behavior During Therapy  Anxious       Past Medical History:  Diagnosis Date  . Chlamydia   . Diabetes ([place])   . GERD (gastroesophageal reflux disease)   . Hyperlipidemia   . Hypertension   . Migraine   . Obstructive sleep apnea on CPAP    pt has not worn CPAP in years  . Seasonal allergies   . Wears glasses     Past Surgical History:  Procedure Laterality Date  . ABDOMINAL HYSTERECTOMY N/A 07/16/2013   Procedure: HYSTERECTOMY ABDOMINAL;  Surgeon: Allyn Kenner, DO;  Location: [facility];  Service: Gynecology;  Laterality: N/A;  . BILATERAL SALPINGECTOMY Bilateral 07/16/2013   Procedure: BILATERAL SALPINGECTOMY;  Surgeon: Allyn Kenner, DO;  Location: [facility];  Service: Gynecology;  Laterality: Bilateral;  . CARPAL TUNNEL RELEASE  01/16/2012   Procedure: CARPAL TUNNEL RELEASE;  Surgeon: Nita Sells, MD;  Location: [facility];  Service: Orthopedics;  Laterality: Left;  . CARPAL TUNNEL RELEASE  02/20/2012   Procedure: CARPAL TUNNEL RELEASE;  Surgeon: Nita Sells, MD;  Location: [facility];  Service: Orthopedics;  Laterality: Right;  . COLONOSCOPY    . CYSTO WITH HYDRODISTENSION N/A 07/16/2013   Procedure: CYSTOSCOPY/HYDRODISTENSION;  Surgeon: Reece Packer, MD;  Location: [facility];  Service: Urology;  Laterality: N/A;  . DILATION AND CURETTAGE OF UTERUS    . FOOT SURGERY    . HYSTEROSCOPY W/D&C  10/31/2011   Procedure: DILATATION AND CURETTAGE /HYSTEROSCOPY;  Surgeon: Allyn Kenner, DO;  Location: [facility];  Service: Gynecology;  Laterality: N/A;  . LAPAROSCOPY N/A 07/16/2013   Procedure: LAPAROSCOPY DIAGNOSTIC;  Surgeon: Allyn Kenner, DO;  Location: [facility];  Service: Gynecology;  Laterality: N/A;  . PLANTAR FASCIA SURGERY    . PUBOVAGINAL SLING N/A 07/16/2013   Procedure: Gaynelle Arabian;  Surgeon: Reece Packer, MD;  Location: [facility];  Service: Urology;  Laterality: N/A;  . TUBAL LIGATION    . [place],  2003    There were no vitals filed for this visit.  Subjective Assessment - 02/09/18 1213    Patient is accompained by:  Family member   Daughter   Pertinent History  Pt hit by 85# pipe at work June 2019, post-concussive syndrome,memory deficits    Patient Stated Goals  Pt's goal is to stop hurting (02/05/18) (regarding headaches)    Currently in Pain?  Yes    Pain Onset  --   have them all the time            Vestibular Assessment - 02/09/18 1219      Symptom Behavior   Type of Dizziness  "World moves"    Frequency  of Dizziness  daily      Occulomotor Exam   Occulomotor Alignment  Normal    Spontaneous  Absent    Gaze-induced  Absent    Smooth Pursuits  Intact   extra head movements   Saccades  Slow    Comment  Convergence impaired      Vestibulo-Occular Reflex   VOR to Slow Head Movement  Comment   difficulty maintaining focus on target   Comment  HIT negative but difficulty keeping eyes on target      Positional Sensitivities   Nose to Right Knee  No dizziness   headache   Right Knee to Sitting  No dizziness    Nose to Left Knee  No dizziness    Left Knee to Sitting  No dizziness   headache   Head Turning x 5  Mild dizziness    Head Nodding x 5  Mild dizziness    Pivot Right in Standing  No dizziness     Pivot Left in Standing  No dizziness    Positional Sensitivities Comments  keeps eye closed during movements               [facility] Adult PT Treatment/Exercise - 02/09/18 1245      Therapeutic Activites    Therapeutic Activities  Other Therapeutic Activities    Other Therapeutic Activities  educated pt and sister on TBI support group and benefit of peer support and normalizing her experience.  Patient stated that she feels like she is "continuously getting re hit in the head."  Discussed the protective role of pain but also how the brain can be in a cycle of increased vigilence and how that can affect attention/memory.  Encouraged pt to discuss this as well as her headaches with neuropsych and physiatrist.  Pt also asking if she can go back to work.  Discussed how current symptoms may interfere with her ability to work right now.             PT Education - 02/09/18 1253    Education Details  see TA    Person(s) Educated  Patient;Other (comment)   sister   Methods  Explanation    Comprehension  Verbalized understanding       PT Short Term Goals - 02/06/18 2004      PT SHORT TERM GOAL #1   Title  Patient to be independent with HEP for vestibualr rehab and headache symptoms and balance.  TARGET 03/02/18    Time  4    Period  Weeks    Status  New    Target Date  03/02/18      PT SHORT TERM GOAL #2   Title  Pt will improve DGI score to at least 15/24 for decreased fall risk.    Time  4    Period  Weeks    Status  New    Target Date  03/02/18      PT SHORT TERM GOAL #3   Title  Pt will improve TUG score to less than or equal to 13.5 seconds for decreased fall risk.    Time  4    Period  Weeks    Status  New    Target Date  03/02/18      PT SHORT TERM GOAL #4   Title  Pt/family will verbalize understanding of fall prevention in home environment.    Time  4    Period  Weeks    Status  New    Target Date  03/02/18        PT Long Term Goals - 02/06/18 2006       PT LONG TERM GOAL #1   Title  patient to be independent with advanced HEP to address vestibular system and balance.  TARGET 03/16/18    Time  6    Period  Weeks    Status  New    Target Date  03/16/18      PT LONG TERM GOAL #2   Title  patient to report reduction in headache frequency, intensity, duration by >/= 50%, for improved participation in ADLs.    Time  6    Period  Weeks    Status  New    Target Date  03/16/18      PT LONG TERM GOAL #3   Title  patient to improve DGI to at least 19/24 for demonstrating reduced fall risk    Time  6    Period  Weeks    Status  New    Target Date  03/16/18      PT LONG TERM GOAL #4   Title  Pt will ambulate at least 1000 ft, indoors and outdoors, independently, no LOB, for return to community gait activities.    Time  6    Period  Weeks    Status  New    Target Date  03/16/18            Plan - 02/09/18 1255    Clinical Impression Statement  Pt initially asking if she has to do therapy and reports significant headache today.  Following discussion and education with pt and sister in private, quiet treatment room pt was willing to participate in further assessment of vestibular system.  Pt does present with oculomotor impairments and motion sensitivity.  Did not test for positional vertigo today but will continue to assess and will begin to initiate habituation and gaze stability exercises into treatment and HEP.    Rehab Potential  Good    Clinical Impairments Affecting Rehab Potential  Severity of (cognitive) deficits following injury; family involvement (sister working, has possible hired caregiver for home)    PT Frequency  Other (comment)   3x/wk for 1 week, then 2x/wk for 5 weeks   PT Treatment/Interventions  ADLs/Self Care Home Management;Gait training;Functional mobility training;Therapeutic activities;Therapeutic exercise;Patient/family education;Neuromuscular re-education;Balance training;Canalith Repostioning;Visual/perceptual  remediation/compensation;Vestibular    PT Next Visit Plan  Try to treat in a dark/quiet room if she is having severe HA.  How did her appointment with neuropsych go?  Check gait velocity; assess for positional vertigo.  Initiate HEP: balance, habituation, oculomotor, gaze stability in sitting    Consulted and Agree with Plan of Care  Patient;Family member/caregiver    Family Member Consulted  sister       Patient will benefit from skilled therapeutic intervention in order to improve the following deficits and impairments:  Abnormal gait, Decreased balance, Decreased safety awareness, Decreased mobility, Difficulty walking, Postural dysfunction, Dizziness, Decreased activity tolerance  Visit Diagnosis: Unsteadiness on feet  Other abnormalities of gait and mobility  Dizziness and giddiness     Problem List Patient Active Problem List   Diagnosis Date Noted  . Anterograde amnesia   . Postconcussive syndrome 01/01/2018  . Asthma   . Essential hypertension   . Diabetes mellitus type 2 in obese ([place])   . Dyslipidemia   . Vascular headache   . Chronic post-traumatic headache, not intractable 11/20/2017  .  Post concussion syndrome 11/20/2017  . Shortness of breath on exertion 05/25/2017  . Other fatigue 05/25/2017  . Type 2 diabetes mellitus without complication, without long-term current use of insulin ([place]) 05/25/2017  . Uncontrolled hypertension 05/25/2017  . Chronic migraine without aura, with intractable migraine, so stated, with status migrainosus 03/20/2017  . S/P hysterectomy 07/16/2013    Rico Junker, PT, DPT 02/09/18    1:02 PM    [facility] [address] [place], [place], [address] Phone: [phone]   Fax:  [phone]  Name: Bridget Anderson MRN: [id number] Date of Birth: Jan 11, 1971

## 2018-02-16 ENCOUNTER — Encounter: Payer: No Typology Code available for payment source | Attending: Psychology | Admitting: Psychology

## 2018-02-16 DIAGNOSIS — F0781 Postconcussional syndrome: Secondary | ICD-10-CM | POA: Insufficient documentation

## 2018-02-16 DIAGNOSIS — R413 Other amnesia: Secondary | ICD-10-CM | POA: Diagnosis present

## 2018-02-16 DIAGNOSIS — G441 Vascular headache, not elsewhere classified: Secondary | ICD-10-CM

## 2018-02-16 DIAGNOSIS — G44329 Chronic post-traumatic headache, not intractable: Secondary | ICD-10-CM | POA: Insufficient documentation

## 2018-02-22 ENCOUNTER — Ambulatory Visit: Payer: No Typology Code available for payment source | Admitting: Speech Pathology

## 2018-02-22 ENCOUNTER — Ambulatory Visit: Payer: No Typology Code available for payment source | Admitting: Physical Therapy

## 2018-02-22 DIAGNOSIS — R41841 Cognitive communication deficit: Secondary | ICD-10-CM | POA: Insufficient documentation

## 2018-02-22 DIAGNOSIS — R2681 Unsteadiness on feet: Secondary | ICD-10-CM | POA: Insufficient documentation

## 2018-02-22 DIAGNOSIS — R42 Dizziness and giddiness: Secondary | ICD-10-CM

## 2018-02-22 DIAGNOSIS — R2689 Other abnormalities of gait and mobility: Secondary | ICD-10-CM | POA: Insufficient documentation

## 2018-02-27 ENCOUNTER — Ambulatory Visit: Payer: No Typology Code available for payment source

## 2018-02-27 ENCOUNTER — Ambulatory Visit: Payer: No Typology Code available for payment source | Admitting: Physical Therapy

## 2018-03-07 ENCOUNTER — Encounter: Payer: Self-pay | Admitting: Physical Therapy

## 2018-03-07 ENCOUNTER — Encounter
Payer: No Typology Code available for payment source | Attending: Physical Medicine & Rehabilitation | Admitting: Physical Medicine & Rehabilitation

## 2018-03-07 ENCOUNTER — Ambulatory Visit: Payer: No Typology Code available for payment source | Admitting: Physical Therapy

## 2018-03-07 ENCOUNTER — Other Ambulatory Visit: Payer: Self-pay

## 2018-03-07 ENCOUNTER — Encounter: Payer: Self-pay | Admitting: Physical Medicine & Rehabilitation

## 2018-03-07 ENCOUNTER — Ambulatory Visit: Payer: No Typology Code available for payment source

## 2018-03-07 VITALS — BP 169/104 | HR 65 | Ht 66.0 in | Wt 218.0 lb

## 2018-03-07 VITALS — BP 140/100

## 2018-03-07 DIAGNOSIS — R2689 Other abnormalities of gait and mobility: Secondary | ICD-10-CM

## 2018-03-07 DIAGNOSIS — R2681 Unsteadiness on feet: Secondary | ICD-10-CM

## 2018-03-07 DIAGNOSIS — G44329 Chronic post-traumatic headache, not intractable: Secondary | ICD-10-CM | POA: Diagnosis present

## 2018-03-07 DIAGNOSIS — F0781 Postconcussional syndrome: Secondary | ICD-10-CM | POA: Diagnosis present

## 2018-03-07 DIAGNOSIS — G441 Vascular headache, not elsewhere classified: Secondary | ICD-10-CM | POA: Insufficient documentation

## 2018-03-07 DIAGNOSIS — R41841 Cognitive communication deficit: Secondary | ICD-10-CM

## 2018-03-07 DIAGNOSIS — R42 Dizziness and giddiness: Secondary | ICD-10-CM

## 2018-03-07 NOTE — Progress Notes (Signed)
Subjective:    Patient ID: Bridget Anderson, female    DOB: [birth year], 48 y.o.   MRN: [id number]  HPI   Bridget Anderson is here in follow up of her TBI and post-concussion syndrome. Sister states that she has been taking her medications inconsistently at best.  [name] states that she is taking medications as prescribed.  Sister states that there are a lot of leftover pills still at home given that she is not taking them as she should.  There has not been an aide at home unfortunately.  Her sister is doing the best she can with transportation oversight but she has had to work and has had some family matters of her own to handle.  Blood sugars have been out of control.  [name] reports ongoing headaches.  Blood pressure was high again today on examination and has not been checked consistently at home.  Worker's Comp. Tourist information centre manager is here today and states that she will be meeting with the patient and the potential aide who will be at the house tomorrow morning.  This person would help with medication and supervision as well as general safety.  There still is a need for consistent transportation to therapies.  Is a stretch currently for her sister given her job and other responsibilities.    Pain Inventory Average Pain 8 Pain Right Now 8 My pain is aching  In the last 24 hours, has pain interfered with the following? General activity 9 Relation with others 9 Enjoyment of life 9 What TIME of day is your pain at its worst? all Sleep (in general) Poor  Pain is worse with: inactivity and unsure Pain improves with: rest Relief from Meds: n/a  Mobility ability to climb steps?  yes do you drive?  yes  Function Do you have any goals in this area?  yes  Neuro/Psych confusion  Prior Studies Any changes since last visit?  no  Physicians involved in your care Any changes since last visit?  no   Family History  Problem Relation Age of Onset  . Hypertension Mother   . Hyperlipidemia Mother   .  Hypertension Father   . Hyperlipidemia Father   . Thyroid disease Father   . Hypertension Brother    Social History   Socioeconomic History  . Marital status: Single    Spouse name: Not on file  . Number of children: 2  . Years of education: Not on file  . Highest education level: Associate degree: academic program  Occupational History  . Occupation: Administrator, arts  Social Needs  . Financial resource strain: Not on file  . Food insecurity:    Worry: Not on file    Inability: Not on file  . Transportation needs:    Medical: Not on file    Non-medical: Not on file  Tobacco Use  . Smoking status: Never Smoker  . Smokeless tobacco: Never Used  Substance and Sexual Activity  . Alcohol use: Yes    Comment: Rarely  . Drug use: No  . Sexual activity: Yes    Birth control/protection: Surgical  Lifestyle  . Physical activity:    Days per week: Not on file    Minutes per session: Not on file  . Stress: Not on file  Relationships  . Social connections:    Talks on phone: Not on file    Gets together: Not on file    Attends religious service: Not on file    Active member of club or  organization: Not on file    Attends meetings of clubs or organizations: Not on file    Relationship status: Not on file  Other Topics Concern  . Not on file  Social History Narrative   Lives at home with her daughter   Right handed   Drinks 3 cups of caffeine daily   Past Surgical History:  Procedure Laterality Date  . ABDOMINAL HYSTERECTOMY N/A 07/16/2013   Procedure: HYSTERECTOMY ABDOMINAL;  Surgeon: Allyn Kenner, DO;  Location: [facility];  Service: Gynecology;  Laterality: N/A;  . BILATERAL SALPINGECTOMY Bilateral 07/16/2013   Procedure: BILATERAL SALPINGECTOMY;  Surgeon: Allyn Kenner, DO;  Location: [facility];  Service: Gynecology;  Laterality: Bilateral;  . CARPAL TUNNEL RELEASE  01/16/2012   Procedure: CARPAL TUNNEL RELEASE;  Surgeon: Nita Sells, MD;  Location: [facility];  Service: Orthopedics;  Laterality: Left;  . CARPAL TUNNEL RELEASE  02/20/2012   Procedure: CARPAL TUNNEL RELEASE;  Surgeon: Nita Sells, MD;  Location: [facility];  Service: Orthopedics;  Laterality: Right;  . COLONOSCOPY    . CYSTO WITH HYDRODISTENSION N/A 07/16/2013   Procedure: CYSTOSCOPY/HYDRODISTENSION;  Surgeon: Reece Packer, MD;  Location: [facility];  Service: Urology;  Laterality: N/A;  . DILATION AND CURETTAGE OF UTERUS    . FOOT SURGERY    . HYSTEROSCOPY W/D&C  10/31/2011   Procedure: DILATATION AND CURETTAGE /HYSTEROSCOPY;  Surgeon: Allyn Kenner, DO;  Location: [facility];  Service: Gynecology;  Laterality: N/A;  . LAPAROSCOPY N/A 07/16/2013   Procedure: LAPAROSCOPY DIAGNOSTIC;  Surgeon: Allyn Kenner, DO;  Location: [facility];  Service: Gynecology;  Laterality: N/A;  . PLANTAR FASCIA SURGERY    . PUBOVAGINAL SLING N/A 07/16/2013   Procedure: Gaynelle Arabian;  Surgeon: Reece Packer, MD;  Location: [facility];  Service: Urology;  Laterality: N/A;  . TUBAL LIGATION    . VAGINAL DELIVERY  1995,  2003   Past Medical History:  Diagnosis Date  . Chlamydia   . Diabetes ([place])   . GERD (gastroesophageal reflux disease)   . Hyperlipidemia   . Hypertension   . Migraine   . Obstructive sleep apnea on CPAP    pt has not worn CPAP in years  . Seasonal allergies   . Wears glasses    BP (!) 169/104   Pulse 65   Ht 5\' 6"  (1.676 m)   Wt 218 lb (98.9 kg)   LMP 06/27/2013   SpO2 96%   BMI 35.19 kg/m   Opioid Risk Score:   Fall Risk Score:  `1  Depression screen PHQ 2/9  Depression screen [facility] 2/9 03/07/2018 11/20/2017 05/25/2017  Decreased Interest 0 2 3  Down, Depressed, Hopeless 0 2 1  PHQ - 2 Score 0 4 4  Altered sleeping - 3 2  Tired, decreased energy - 2 3  Change in appetite - 2 1  Feeling bad or failure about yourself  - 3 0  Trouble concentrating - 3 3  Moving slowly or fidgety/restless - 2 0  Suicidal thoughts - 1 0    PHQ-9 Score - 20 13  Difficult doing work/chores - Extremely dIfficult Not difficult at all    Review of Systems  Constitutional: Negative.   HENT: Negative.   Eyes: Negative.   Respiratory: Negative.   Cardiovascular: Negative.   Gastrointestinal: Negative.   Endocrine: Negative.   Genitourinary: Negative.   Musculoskeletal: Negative.   Skin: Negative.   Allergic/Immunologic: Negative.   Neurological: Positive for weakness and  headaches.  Hematological: Negative.   Psychiatric/Behavioral: Negative.   All other systems reviewed and are negative.      Objective:   Physical Exam General: No acute distress HEENT: EOMI, oral membranes moist Cards: reg rate  Chest: normal effort Abdomen: Soft, NT, ND Skin: dry, intact Extremities: no edema  Neuro:Patient is alert.  Language is normal.  She does not recognize me today.  She does not know why she is here.  She moves all fours.  Balance and strength are functional.  She is wearing sunglasses because of her headache.  When I tried to engage the patient from a cognitive standpoint she struggled displaying any insight or awareness.  Day-to-day memory appears poor.  She almost confabulates. Musculoskeletal:Functional neck range of motion.  No deformities of the head.  Transferred easily from sit to stand.Marland Kitchen Psych:Became agitated when we were talking about her condition.  Tried to engage her and her ability to contribute to the discussion was limited.      Assessment & Plan:  1. Post concussionsyndrome with ongoing headaches, memory loss, tinnitus, vestibular symptoms, insomnia and emotional lability 2.History of migraine headaches since her middle school years. Patient had been seen twice by neurology here in [place]. Apparently she has been on numerous medications in the past for prophylaxis and treatment 3. Uncontrolled hypertension  4. DM II    Plan: 1.     Again we seem to be stuck in the same situation  as her last visit.  Her sister is only able to provide so much help at home and the patient has been left at home unassisted.  She is not keeping any type of routine or schedule.  She is not taking her medications as prescribed.  Therefore headaches are out of control as well as her blood pressure and sugars.  Therapy has been inconsistent due to the sister being limited in her ability to provide transportation.  Spoke with case manager today about having an aide at home to help with medications and supervision and to provide some generalized continuity.  That will need to start.   2.     Will try again with outpatient therapies.  If we cannot make transportation happens we may need to fall back to home health. 3. Patient needs to resume all medications as previously discussed.  No refills were needed today as the patient has plenty of medications to left at home 4. Continue Aricept for memory 5.    Again discussed with the patient that we need to improve her memory and awareness.  A sign of improvement would be that she has awareness of her own deficits.  This does not exist at present.  She states that she wants to go back to work and to drive but yet does not understand the profound nature of her problems.   7.  Follow-up with me in about  month's time.    Neuropsychology  follow-up scheduled as well.  50 minutes were spent today including conference with case manager.

## 2018-03-07 NOTE — Patient Instructions (Signed)
PLEASE FEEL FREE TO CALL OUR OFFICE WITH ANY PROBLEMS OR QUESTIONS ([phone])      

## 2018-03-07 NOTE — Patient Instructions (Signed)
Access Code: [email or web address]  URL: [email or web address]  Date: 03/07/2018  Prepared by: Misty Stanley   Exercises  Romberg Stance with Head Rotation - 10 reps - 2 sets - 2x daily - 5x weekly  Walking Tandem Stance - 10 reps - 3 sets - 1x daily - 7x weekly

## 2018-03-07 NOTE — Therapy (Signed)
[facility] [address] [place], [place], [address] Phone: [phone]   Fax:  [phone]  Physical Therapy Treatment  Patient Details  Name: Bridget Anderson MRN: [id number] Date of Birth: 1970-04-08 Referring Provider (PT): Dr. Oval Linsey   Encounter Date: 03/07/2018  PT End of Session - 03/07/18 1550    Visit Number  3    Number of Visits  13    Date for PT Re-Evaluation  04/06/18    Authorization Type  Workers Comp    PT Start Time  1412    PT Stop Time  1450    PT Time Calculation (min)  38 min    Activity Tolerance  Patient limited by pain    Behavior During Therapy  Flat affect       Past Medical History:  Diagnosis Date  . Chlamydia   . Diabetes ([place])   . GERD (gastroesophageal reflux disease)   . Hyperlipidemia   . Hypertension   . Migraine   . Obstructive sleep apnea on CPAP    pt has not worn CPAP in years  . Seasonal allergies   . Wears glasses     Past Surgical History:  Procedure Laterality Date  . ABDOMINAL HYSTERECTOMY N/A 07/16/2013   Procedure: HYSTERECTOMY ABDOMINAL;  Surgeon: Allyn Kenner, DO;  Location: [facility];  Service: Gynecology;  Laterality: N/A;  . BILATERAL SALPINGECTOMY Bilateral 07/16/2013   Procedure: BILATERAL SALPINGECTOMY;  Surgeon: Allyn Kenner, DO;  Location: [facility];  Service: Gynecology;  Laterality: Bilateral;  . CARPAL TUNNEL RELEASE  01/16/2012   Procedure: CARPAL TUNNEL RELEASE;  Surgeon: Nita Sells, MD;  Location: [facility];  Service: Orthopedics;  Laterality: Left;  . CARPAL TUNNEL RELEASE  02/20/2012   Procedure: CARPAL TUNNEL RELEASE;  Surgeon: Nita Sells, MD;  Location: [facility];  Service: Orthopedics;  Laterality: Right;  . COLONOSCOPY    . CYSTO WITH HYDRODISTENSION N/A 07/16/2013   Procedure: CYSTOSCOPY/HYDRODISTENSION;  Surgeon: Reece Packer, MD;  Location: [facility];  Service: Urology;   Laterality: N/A;  . DILATION AND CURETTAGE OF UTERUS    . FOOT SURGERY    . HYSTEROSCOPY W/D&C  10/31/2011   Procedure: DILATATION AND CURETTAGE /HYSTEROSCOPY;  Surgeon: Allyn Kenner, DO;  Location: [facility];  Service: Gynecology;  Laterality: N/A;  . LAPAROSCOPY N/A 07/16/2013   Procedure: LAPAROSCOPY DIAGNOSTIC;  Surgeon: Allyn Kenner, DO;  Location: [facility];  Service: Gynecology;  Laterality: N/A;  . PLANTAR FASCIA SURGERY    . PUBOVAGINAL SLING N/A 07/16/2013   Procedure: Gaynelle Arabian;  Surgeon: Reece Packer, MD;  Location: [facility];  Service: Urology;  Laterality: N/A;  . TUBAL LIGATION    . [place],  2003    Vitals:   03/07/18 1416  BP: (!) 140/100    Subjective Assessment - 03/07/18 1416    Subjective  Still having headaches.  No falls.  States today is a good day but she continues to report not remembering coming to this facility    Patient is accompained by:  Family member   Daughter   Pertinent History  Pt hit by 85# pipe at work June 2019, post-concussive syndrome,memory deficits    Patient Stated Goals  Pt's goal is to stop hurting (02/05/18) (regarding headaches)    Currently in Pain?  Yes    Pain Score  8     Pain Location  Head    Pain Descriptors / Indicators  Headache    Pain Type  Chronic pain    Pain Onset  --   have them all the time        [facility] PT Assessment - 03/07/18 1421      Standardized Balance Assessment   Standardized Balance Assessment  Timed Up and Go Test;10 meter walk test;Dynamic Gait Index    10 Meter Walk  3.03 ft/sec without AD      Dynamic Gait Index   Level Surface  Mild Impairment    Change in Gait Speed  Mild Impairment    Gait with Horizontal Head Turns  Moderate Impairment    Gait with Vertical Head Turns  Moderate Impairment    Gait and Pivot Turn  Mild Impairment    Step Over Obstacle  Mild Impairment    Step Around Obstacles  Mild Impairment    Steps  Mild Impairment    Total Score  14    DGI  comment:  14/24  still at increased risk for falls      Timed Up and Go Test   Normal TUG (seconds)  13.41    TUG Comments  decreased falls risk                        Balance Exercises - 03/07/18 1552      Balance Exercises: Standing   Standing Eyes Closed  Narrow base of support (BOS);Head turns;Solid surface;Other reps (comment);10 secs   maintain x 10 sec; head turns/nods x 10       PT Education - 03/07/18 1550    Education Details  adding more visits due to missed visits; initiating HEP    Person(s) Educated  Patient;Other (comment)   sister   Methods  Explanation;Demonstration    Comprehension  Need further instruction       PT Short Term Goals - 03/07/18 1430      PT SHORT TERM GOAL #1   Title  Patient to be independent with HEP for vestibualr rehab and headache symptoms and balance.  TARGET 03/02/18    Baseline  no HEP has been set to date    Time  4    Period  Weeks    Status  On-going      PT SHORT TERM GOAL #2   Title  Pt will improve DGI score to at least 15/24 for decreased fall risk.    Baseline  14/24    Time  4    Period  Weeks    Status  Partially Met      PT SHORT TERM GOAL #3   Title  Pt will improve TUG score to less than or equal to 13.5 seconds for decreased fall risk.    Baseline  13.5    Time  4    Period  Weeks    Status  Achieved      PT SHORT TERM GOAL #4   Title  Pt/family will verbalize understanding of fall prevention in home environment.    Baseline  pt unable to recall recommendations    Time  4    Period  Weeks    Status  Not Met        PT Long Term Goals - 03/07/18 1557      PT LONG TERM GOAL #1   Title  patient to be independent with advanced HEP to address vestibular system and balance.  (Goals delayed to 2/14 due to multiple weeks of missed visits)  Period  Weeks    Status  New    Target Date  04/06/18      PT LONG TERM GOAL #2   Title  patient to report reduction in headache frequency,  intensity, duration by >/= 50%, for improved participation in ADLs.    Time  6    Period  Weeks    Status  New    Target Date  04/06/18      PT LONG TERM GOAL #3   Title  patient to improve DGI to at least 19/24 for demonstrating reduced fall risk    Time  6    Period  Weeks    Status  New    Target Date  04/06/18      PT LONG TERM GOAL #4   Title  Pt will ambulate at least 1000 ft, indoors and outdoors, independently, no LOB, for return to community gait activities.    Time  6    Period  Weeks    Status  New    Target Date  04/06/18            Plan - 03/07/18 1552    Clinical Impression Statement  This is only third visit for pt due to multiple no shows and missed visits.  Performed reassessment of balance to determine ongoing impairments and functional limitations.  Despite limited visits pt does demonstrate improvement in balance during gait as indicated by DGI and decreased time to perform TUG.  Pt has met 1/4 STG.  Attempted to address STG #1 by initiating balance HEP but after head turns and nods in corner with eyes closed pt reporting increase in HA.  Still unable to set initial HEP.  Pt did not meet final STG due to impaired memory and recall of recommendations.  Pt will benefit from adding more therapy visits and having sister come back during sessions to assist with recall and compliance with HEP.      Rehab Potential  Good    Clinical Impairments Affecting Rehab Potential  Severity of (cognitive) deficits following injury; family involvement (sister working, has possible hired caregiver for home)    PT Frequency  Other (comment)   3x/wk for 1 week, then 2x/wk for 5 weeks   PT Treatment/Interventions  ADLs/Self Care Home Management;Gait training;Functional mobility training;Therapeutic activities;Therapeutic exercise;Patient/family education;Neuromuscular re-education;Balance training;Canalith Repostioning;Visual/perceptual remediation/compensation;Vestibular    PT Next  Visit Plan  Have her sister come back to watch HEP.  Add to and finalize HEP for balance, habituation, oculomotor, gaze stability in sitting    PT Home Exercise Plan  [id number] ([employer] started but not finalized)    Consulted and Agree with Plan of Care  Patient;Family member/caregiver    Family Member Consulted  sister       Patient will benefit from skilled therapeutic intervention in order to improve the following deficits and impairments:  Abnormal gait, Decreased balance, Decreased safety awareness, Decreased mobility, Difficulty walking, Postural dysfunction, Dizziness, Decreased activity tolerance  Visit Diagnosis: Unsteadiness on feet  Other abnormalities of gait and mobility  Dizziness and giddiness     Problem List Patient Active Problem List   Diagnosis Date Noted  . Anterograde amnesia   . Postconcussive syndrome 01/01/2018  . Asthma   . Essential hypertension   . Diabetes mellitus type 2 in obese ([place])   . Dyslipidemia   . Vascular headache   . Chronic post-traumatic headache, not intractable 11/20/2017  . Post concussion syndrome 11/20/2017  . Shortness of breath on  exertion 05/25/2017  . Other fatigue 05/25/2017  . Type 2 diabetes mellitus without complication, without long-term current use of insulin ([place]) 05/25/2017  . Uncontrolled hypertension 05/25/2017  . Chronic migraine without aura, with intractable migraine, so stated, with status migrainosus 03/20/2017  . S/P hysterectomy 07/16/2013    Rico Junker, PT, DPT 03/07/18    4:00 PM    [facility] [address] [place], [place], [address] Phone: [phone]   Fax:  [phone]  Name: CARTIER MAPEL MRN: [id number] Date of Birth: Oct 06, 1970

## 2018-03-07 NOTE — Therapy (Signed)
[facility] [address] [place], [place], [address] Phone: [phone]   Fax:  [phone]  Speech Language Pathology Treatment  Patient Details  Name: Bridget Anderson MRN: [id number] Date of Birth: [birth year] Referring Provider (SLP): Alger Simons, MD   Encounter Date: 03/07/2018  End of Session - 03/07/18 1738    Visit Number  3    Number of Visits  25    Date for SLP Re-Evaluation  04/19/18    Authorization - Visit Number  2    Authorization - Number of Visits  25    SLP Start Time  1450    SLP Stop Time   3818    SLP Time Calculation (min)  40 min    Activity Tolerance  Patient limited by pain   pt had head between hands due to headache for approx 75% of session      Past Medical History:  Diagnosis Date  . Chlamydia   . Diabetes ([place])   . GERD (gastroesophageal reflux disease)   . Hyperlipidemia   . Hypertension   . Migraine   . Obstructive sleep apnea on CPAP    pt has not worn CPAP in years  . Seasonal allergies   . Wears glasses     Past Surgical History:  Procedure Laterality Date  . ABDOMINAL HYSTERECTOMY N/A 07/16/2013   Procedure: HYSTERECTOMY ABDOMINAL;  Surgeon: Allyn Kenner, DO;  Location: [facility];  Service: Gynecology;  Laterality: N/A;  . BILATERAL SALPINGECTOMY Bilateral 07/16/2013   Procedure: BILATERAL SALPINGECTOMY;  Surgeon: Allyn Kenner, DO;  Location: [facility];  Service: Gynecology;  Laterality: Bilateral;  . CARPAL TUNNEL RELEASE  01/16/2012   Procedure: CARPAL TUNNEL RELEASE;  Surgeon: Nita Sells, MD;  Location: [facility];  Service: Orthopedics;  Laterality: Left;  . CARPAL TUNNEL RELEASE  02/20/2012   Procedure: CARPAL TUNNEL RELEASE;  Surgeon: Nita Sells, MD;  Location: [facility];  Service: Orthopedics;  Laterality: Right;  . COLONOSCOPY    . CYSTO WITH HYDRODISTENSION N/A 07/16/2013   Procedure:  CYSTOSCOPY/HYDRODISTENSION;  Surgeon: Reece Packer, MD;  Location: [facility];  Service: Urology;  Laterality: N/A;  . DILATION AND CURETTAGE OF UTERUS    . FOOT SURGERY    . HYSTEROSCOPY W/D&C  10/31/2011   Procedure: DILATATION AND CURETTAGE /HYSTEROSCOPY;  Surgeon: Allyn Kenner, DO;  Location: [facility];  Service: Gynecology;  Laterality: N/A;  . LAPAROSCOPY N/A 07/16/2013   Procedure: LAPAROSCOPY DIAGNOSTIC;  Surgeon: Allyn Kenner, DO;  Location: [facility];  Service: Gynecology;  Laterality: N/A;  . PLANTAR FASCIA SURGERY    . PUBOVAGINAL SLING N/A 07/16/2013   Procedure: Gaynelle Arabian;  Surgeon: Reece Packer, MD;  Location: [facility];  Service: Urology;  Laterality: N/A;  . TUBAL LIGATION    . [place],  2003    There were no vitals filed for this visit.  Subjective Assessment - 03/07/18 1457    Subjective  Pt did not recall seeing Dr. Naaman Plummer at 1300 (pt checked out at 1352 from his office).    Currently in Pain?  Yes    Pain Score  7     Pain Location  Head    Pain Orientation  Upper    Pain Descriptors / Indicators  Headache    Pain Type  Chronic pain    Pain Frequency  Constant    Aggravating Factors   being upright    Pain Relieving Factors  meds, sleep            ADULT SLP TREATMENT - 03/07/18 1459      General Information   Behavior/Cognition  Distractible;Cooperative;Decreased sustained attention      Treatment Provided   Treatment provided  Cognitive-Linquistic      Cognitive-Linquistic Treatment   Treatment focused on  Patient/family/caregiver education    Skilled Treatment  Pt arrived alone to ST. "It's upsetting when someone tells me (that I didn't remember seeing the MD." SLP used this situation to encourage pt to keep a notebook about journaling about what pt has done that day and other things to recall. SLP also suggested a calendar to write down appointments. Because pt arrived alone and would likely not recall items discussed today  SLP went into lobby for family and pt sister arrived into ST session for SLP to discuss with pt. SLP told pt/sister that pt should keep memory journal, sister stated pt wa sdoing so but that she had to cue pt to use 100% of the time. SLP assured sister that did not surprise SLP based on pt did not recall MD appointment at 1300. SLP explained to pt/sister that in order for efficacious ST there must be some carryover/memory from session to session and that SLP was hopeful that consistency in therapy would assist somewhat with this. SLP then talked to/educatd pt/sister about tasks they could complete at home to hopefully improve pt's memory/attention re: romance novels or other novels. SLP also mentioned asking pt questions about what pt has done earlier in the day and have her check her journal if unsure, to get pt into the habit of doing this.      Assessment / Recommendations / Plan   Plan  Continue with current plan of care      Progression Toward Goals   Progression toward goals  Progressing toward goals       SLP Education - 03/07/18 1738    Education Details  journal for memory, bring journal next session, home tasks    Person(s) Educated  Patient;Caregiver(s)    Methods  Explanation    Comprehension  Verbalized understanding;Need further instruction       SLP Short Term Goals - 03/07/18 1743      SLP SHORT TERM GOAL #1   Title  pt will demo use of memory enhancing system outside of ST session (i.e., writing down items/entries in a memory journal) between 8 sessions to enhance or provide a means of functional memory for pt    Time  5    Period  Weeks    Status  On-going      SLP SHORT TERM GOAL #2   Title  pt will tell SLP 3 practical ramifications of her cognitive deficits with modified independence (i.e., written notes) in 4 sessions    Time  5    Period  Weeks    Status  On-going      SLP SHORT TERM GOAL #3   Title  pt will give adequate synopsis of salient written material  she enjoys with modified independence over two sessions    Time  3    Period  Weeks    Status  On-going       SLP Long Term Goals - 03/07/18 1743      SLP LONG TERM GOAL #1   Title  pt will demo appropriate usage of a memory enhancement/compensation system between 10 ST sessions    Time  11  Period  Weeks    Status  On-going       Plan - 03/07/18 1739    Clinical Impression Statement  Pt presents with significant/profound memory deficits (memory encoding?) - pt did not recall MD appt < 1 hour prior to ST todayl. SLP spent the session mostly educating pt/pt's sister re: memory journal, home tasks, telling pt/sister that memory/awareness/carryover from session to session for ST is crucial to recovery, and relating the importance of consistency in attendance. Pt would benefit from skilled ST to develop a system to enhance her memory so that she can increase her independence and her quality of life. If pt does not appear to recall things more consistently from previous ST sessions in approx 4-6 more sessions, d/c may be appropriate.     Speech Therapy Frequency  2x / week    Duration  --   12 weeks/25 sessions   Treatment/Interventions  Compensatory strategies;Patient/family education;Functional tasks;Cueing hierarchy;Cognitive reorganization;Environmental controls;Internal/external aids;SLP instruction and feedback    Potential to Achieve Goals  Fair    Potential Considerations  Severity of impairments;Pain level    Consulted and Agree with Plan of Care  Patient       Patient will benefit from skilled therapeutic intervention in order to improve the following deficits and impairments:   Cognitive communication deficit    Problem List Patient Active Problem List   Diagnosis Date Noted  . Anterograde amnesia   . Postconcussive syndrome 01/01/2018  . Asthma   . Essential hypertension   . Diabetes mellitus type 2 in obese ([place])   . Dyslipidemia   . Vascular headache   . Chronic  post-traumatic headache, not intractable 11/20/2017  . Post concussion syndrome 11/20/2017  . Shortness of breath on exertion 05/25/2017  . Other fatigue 05/25/2017  . Type 2 diabetes mellitus without complication, without long-term current use of insulin ([place]) 05/25/2017  . Uncontrolled hypertension 05/25/2017  . Chronic migraine without aura, with intractable migraine, so stated, with status migrainosus 03/20/2017  . S/P hysterectomy 07/16/2013    [facility] ,[place], CCC-SLP  03/07/2018, 5:43 PM  [facility] [address] [place], [place], [address] Phone: [phone]   Fax:  [phone]   Name: Bridget Anderson MRN: [id number] Date of Birth: Jun 23, 1970

## 2018-03-09 ENCOUNTER — Other Ambulatory Visit (INDEPENDENT_AMBULATORY_CARE_PROVIDER_SITE_OTHER): Payer: Self-pay | Admitting: Physician Assistant

## 2018-03-12 ENCOUNTER — Ambulatory Visit: Payer: 59 | Attending: Physical Medicine & Rehabilitation | Admitting: Physical Therapy

## 2018-03-12 ENCOUNTER — Encounter: Payer: Self-pay | Admitting: Physical Therapy

## 2018-03-12 ENCOUNTER — Ambulatory Visit: Payer: 59

## 2018-03-12 DIAGNOSIS — R2689 Other abnormalities of gait and mobility: Secondary | ICD-10-CM | POA: Insufficient documentation

## 2018-03-12 DIAGNOSIS — R42 Dizziness and giddiness: Secondary | ICD-10-CM | POA: Diagnosis present

## 2018-03-12 DIAGNOSIS — R41841 Cognitive communication deficit: Secondary | ICD-10-CM | POA: Diagnosis present

## 2018-03-12 DIAGNOSIS — R2681 Unsteadiness on feet: Secondary | ICD-10-CM | POA: Diagnosis present

## 2018-03-12 NOTE — Therapy (Signed)
[facility] [address] [place], [place], [address] Phone: [phone]   Fax:  [phone]  Speech Language Pathology Treatment  Patient Details  Name: Bridget Anderson MRN: [id number] Date of Birth: [birth year] Referring Provider (SLP): Alger Simons, MD   Encounter Date: 03/12/2018  End of Session - 03/12/18 1653    Visit Number  4    Number of Visits  25    Date for SLP Re-Evaluation  04/19/18    Authorization - Visit Number  3    Authorization - Number of Visits  25    SLP Start Time  0165    SLP Stop Time   5374    SLP Time Calculation (min)  45 min    Activity Tolerance  Patient tolerated treatment well       Past Medical History:  Diagnosis Date  . Chlamydia   . Diabetes ([place])   . GERD (gastroesophageal reflux disease)   . Hyperlipidemia   . Hypertension   . Migraine   . Obstructive sleep apnea on CPAP    pt has not worn CPAP in years  . Seasonal allergies   . Wears glasses     Past Surgical History:  Procedure Laterality Date  . ABDOMINAL HYSTERECTOMY N/A 07/16/2013   Procedure: HYSTERECTOMY ABDOMINAL;  Surgeon: Allyn Kenner, DO;  Location: [facility];  Service: Gynecology;  Laterality: N/A;  . BILATERAL SALPINGECTOMY Bilateral 07/16/2013   Procedure: BILATERAL SALPINGECTOMY;  Surgeon: Allyn Kenner, DO;  Location: [facility];  Service: Gynecology;  Laterality: Bilateral;  . CARPAL TUNNEL RELEASE  01/16/2012   Procedure: CARPAL TUNNEL RELEASE;  Surgeon: Nita Sells, MD;  Location: [facility];  Service: Orthopedics;  Laterality: Left;  . CARPAL TUNNEL RELEASE  02/20/2012   Procedure: CARPAL TUNNEL RELEASE;  Surgeon: Nita Sells, MD;  Location: [facility];  Service: Orthopedics;  Laterality: Right;  . COLONOSCOPY    . CYSTO WITH HYDRODISTENSION N/A 07/16/2013   Procedure: CYSTOSCOPY/HYDRODISTENSION;  Surgeon: Reece Packer, MD;  Location: [facility];   Service: Urology;  Laterality: N/A;  . DILATION AND CURETTAGE OF UTERUS    . FOOT SURGERY    . HYSTEROSCOPY W/D&C  10/31/2011   Procedure: DILATATION AND CURETTAGE /HYSTEROSCOPY;  Surgeon: Allyn Kenner, DO;  Location: [facility];  Service: Gynecology;  Laterality: N/A;  . LAPAROSCOPY N/A 07/16/2013   Procedure: LAPAROSCOPY DIAGNOSTIC;  Surgeon: Allyn Kenner, DO;  Location: [facility];  Service: Gynecology;  Laterality: N/A;  . PLANTAR FASCIA SURGERY    . PUBOVAGINAL SLING N/A 07/16/2013   Procedure: Gaynelle Arabian;  Surgeon: Reece Packer, MD;  Location: [facility];  Service: Urology;  Laterality: N/A;  . TUBAL LIGATION    . [place],  2003    There were no vitals filed for this visit.  Subjective Assessment - 03/12/18 1545    Subjective  Pt does not recall seeing me last session.     Patient is accompained by:  --   alone   Currently in Pain?  Yes    Pain Score  6     Pain Location  Head    Pain Orientation  --   top ([place])   Pain Descriptors / Indicators  Headache    Pain Onset  More than a month ago    Pain Frequency  Constant    Aggravating Factors   light    Pain Relieving Factors  sleeping  ADULT SLP TREATMENT - 03/12/18 1548      General Information   Behavior/Cognition  Distractible;Cooperative;Decreased sustained attention      Treatment Provided   Treatment provided  Cognitive-Linquistic      Cognitive-Linquistic Treatment   Treatment focused on  Patient/family/caregiver education    Skilled Treatment  "I hate asking for help because it's not who I am." Pt arrives with significant concerns about not wanting to "be a burden to others". Pt did not bring memory notebook sister told SLP last sesison pt was writing in. Pt was unable to answer question if she had a memory notebook/journal or not. As pt arrived alone, SLP got pt's mother from lobby and talked with mother re: rationale to d/c in 2-3 visits if SLP does not see pt with any  carryover/recall of previous sessions. Because pt's mother had no knowledge of things discussed with pt/sister in previous session, SLP then told mother about the previous session and everything talked about, reiterating routine, and home tasks. SLP told pt/mother that there had to be better communication between people who bring pt to tx due to pt's memory deficits. SLP told pt mother pt would have best chance for recall by repetition. SLP told pt and mother he has concerns re: her safety at home if nobody there with pt 24/7. Pt said she refused someone present in her home 24/7 with her.       Assessment / Recommendations / Plan   Plan  Continue with current plan of care      Progression Toward Goals   Progression toward goals  Not progressing toward goals (comment)   communication re: pt between family; severity of deficits      SLP Education - 03/12/18 1652    Education Details  memory journal, home tasks, bring journal, consider 24/7 supervision, d/c in 2-3 sessions if minmal/no carryover or recall between sessions    Person(s) Educated  Patient;Parent(s)    Methods  Explanation    Comprehension  Verbalized understanding;Need further instruction       SLP Short Term Goals - 03/12/18 1657      SLP SHORT TERM GOAL #1   Title  pt will demo use of memory enhancing system outside of ST session (i.e., writing down items/entries in a memory journal) between 8 sessions to enhance or provide a means of functional memory for pt    Time  4    Period  Weeks    Status  On-going      SLP SHORT TERM GOAL #2   Title  pt will tell SLP 3 practical ramifications of her cognitive deficits with modified independence (i.e., written notes) in 4 sessions    Time  4    Period  Weeks    Status  On-going      SLP SHORT TERM GOAL #3   Title  pt will give adequate synopsis of salient written material she enjoys with modified independence over two sessions    Time  4    Period  Weeks    Status  On-going        SLP Long Term Goals - 03/12/18 1657      SLP LONG TERM GOAL #1   Title  pt will demo appropriate usage of a memory enhancement/compensation system between 10 ST sessions    Time  10    Period  Weeks    Status  On-going       Plan - 03/12/18 1654  Clinical Impression Statement  Pt presents with significant/profound memory deficits (memory encoding?) - pt did not recall PT entering [facility] room with ensuing conversation about referral to neuropsych 25 minutes prior. Pt saw neuropsych 02-16-18 and did not recall any of this appointment. As pt's mother stated she did not hear any details of previous ST session, SLP spent the session mostly educating pt/pt's mother re: memory journal, home tasks, memory/awareness/carryover from session to session for ST is crucial to recovery, the importance of procedural memory, and ways pt could use memory journal. Pt would benefit from skilled ST to develop a system to enhance her memory so that she can increase her independence and her quality of life. If pt does not appear to recall things more consistently from previous ST sessions in approx 3-4 more sessions, d/c may be appropriate.     Speech Therapy Frequency  2x / week    Duration  --   12 weeks/25 sessions   Treatment/Interventions  Compensatory strategies;Patient/family education;Functional tasks;Cueing hierarchy;Cognitive reorganization;Environmental controls;Internal/external aids;SLP instruction and feedback    Potential to Achieve Goals  Fair    Potential Considerations  Cooperation/participation level;Severity of impairments;Ability to learn/carryover information       Patient will benefit from skilled therapeutic intervention in order to improve the following deficits and impairments:   Cognitive communication deficit    Problem List Patient Active Problem List   Diagnosis Date Noted  . Anterograde amnesia   . Postconcussive syndrome 01/01/2018  . Asthma   . Essential hypertension    . Diabetes mellitus type 2 in obese ([place])   . Dyslipidemia   . Vascular headache   . Chronic post-traumatic headache, not intractable 11/20/2017  . Post concussion syndrome 11/20/2017  . Shortness of breath on exertion 05/25/2017  . Other fatigue 05/25/2017  . Type 2 diabetes mellitus without complication, without long-term current use of insulin ([place]) 05/25/2017  . Uncontrolled hypertension 05/25/2017  . Chronic migraine without aura, with intractable migraine, so stated, with status migrainosus 03/20/2017  . S/P hysterectomy 07/16/2013    [facility] ,MS, CCC-SLP  03/12/2018, 4:59 PM  [facility] [address] [place], [place], [address] Phone: [phone]   Fax:  [phone]   Name: SHINIKA ESTELLE MRN: [id number] Date of Birth: [birth year]

## 2018-03-12 NOTE — Patient Instructions (Signed)
BRING YOUR MEMORY JOURNAL NEXT SESSION.

## 2018-03-12 NOTE — Therapy (Signed)
[facility] [address] [place], [place], [address] Phone: [phone]   Fax:  [phone]  Physical Therapy Treatment  Patient Details  Name: Bridget Anderson MRN: [id number] Date of Birth: 10/27/1970 Referring Provider (PT): Dr. Oval Linsey   Encounter Date: 03/12/2018  PT End of Session - 03/12/18 1449    Visit Number  4    Number of Visits  13    Date for PT Re-Evaluation  04/06/18    Authorization Type  Workers Comp    PT Start Time  1448    PT Stop Time  1533    PT Time Calculation (min)  45 min    Activity Tolerance  No increased pain    Behavior During Therapy  Flat affect       Past Medical History:  Diagnosis Date  . Chlamydia   . Diabetes ([place])   . GERD (gastroesophageal reflux disease)   . Hyperlipidemia   . Hypertension   . Migraine   . Obstructive sleep apnea on CPAP    pt has not worn CPAP in years  . Seasonal allergies   . Wears glasses     Past Surgical History:  Procedure Laterality Date  . ABDOMINAL HYSTERECTOMY N/A 07/16/2013   Procedure: HYSTERECTOMY ABDOMINAL;  Surgeon: Allyn Kenner, DO;  Location: [facility];  Service: Gynecology;  Laterality: N/A;  . BILATERAL SALPINGECTOMY Bilateral 07/16/2013   Procedure: BILATERAL SALPINGECTOMY;  Surgeon: Allyn Kenner, DO;  Location: [facility];  Service: Gynecology;  Laterality: Bilateral;  . CARPAL TUNNEL RELEASE  01/16/2012   Procedure: CARPAL TUNNEL RELEASE;  Surgeon: Nita Sells, MD;  Location: [facility];  Service: Orthopedics;  Laterality: Left;  . CARPAL TUNNEL RELEASE  02/20/2012   Procedure: CARPAL TUNNEL RELEASE;  Surgeon: Nita Sells, MD;  Location: [facility];  Service: Orthopedics;  Laterality: Right;  . COLONOSCOPY    . CYSTO WITH HYDRODISTENSION N/A 07/16/2013   Procedure: CYSTOSCOPY/HYDRODISTENSION;  Surgeon: Reece Packer, MD;  Location: [facility];  Service: Urology;  Laterality:  N/A;  . DILATION AND CURETTAGE OF UTERUS    . FOOT SURGERY    . HYSTEROSCOPY W/D&C  10/31/2011   Procedure: DILATATION AND CURETTAGE /HYSTEROSCOPY;  Surgeon: Allyn Kenner, DO;  Location: [facility];  Service: Gynecology;  Laterality: N/A;  . LAPAROSCOPY N/A 07/16/2013   Procedure: LAPAROSCOPY DIAGNOSTIC;  Surgeon: Allyn Kenner, DO;  Location: [facility];  Service: Gynecology;  Laterality: N/A;  . PLANTAR FASCIA SURGERY    . PUBOVAGINAL SLING N/A 07/16/2013   Procedure: Gaynelle Arabian;  Surgeon: Reece Packer, MD;  Location: [facility];  Service: Urology;  Laterality: N/A;  . TUBAL LIGATION    . [place],  2003    There were no vitals filed for this visit.  Subjective Assessment - 03/12/18 1450    Subjective  I'm sure I've been doing some exercises. Mother came today and is in the lobby (not sister as requested for education).    Patient is accompained by:  Family member      Pertinent History  Pt hit by 85# pipe at work June 2019, post-concussive syndrome,memory deficits    Patient Stated Goals  Pt's goal is to stop hurting (02/05/18) (regarding headaches)    Currently in Pain?  Yes    Pain Score  7     Pain Location  Head    Pain Orientation  Other (Comment)   top of head  Pain Descriptors / Indicators  Headache;Aching    Pain Type  Chronic pain    Pain Onset  More than a month ago   have them all the time   Pain Frequency  Constant    Aggravating Factors   lights,     Pain Relieving Factors  sleeping             Vestibular Assessment - 03/12/18 0001      Vestibular Assessment   General Observation  reports spinning with changes in position (lying down and sitting up)      Symptom Behavior   Type of Dizziness  Spinning    Frequency of Dizziness  daily    Duration of Dizziness  pt unsure but thinks <1 minute    Aggravating Factors  Lying supine;Supine to sit   later pt reports happens when she closes her eyes   Relieving Factors  Head stationary       Occulomotor Exam   Comment  convergence: left eye drifts laterally with target ~12"      Vestibulo-Occular Reflex   VOR to Slow Head Movement  Comment   difficulty maintaining eyes on target ~15 seconds     Positional Testing   Sidelying Test  Sidelying Right;Sidelying Left      Sidelying Right   Sidelying Right Duration  0    Sidelying Right Symptoms  No nystagmus      Sidelying Left   Sidelying Left Duration  0    Sidelying Left Symptoms  No nystagmus                Vestibular Treatment/Exercise - 03/12/18 1958      Vestibular Treatment/Exercise   Vestibular Treatment Provided  Gaze    Gaze Exercises  X1 Viewing Horizontal;Comment   convergence--pencil pushup; left eye drifts w/ target ~12"     X1 Viewing Horizontal   Foot Position  standing    Time  --   15 sec   Reps  3    Comments  stops when target becomes blurry or "has a shadow"; eyes visibly dysconjugate after 15 sec         Balance Exercises - 03/12/18 2003      Balance Exercises: Standing   Standing Eyes Closed  Narrow base of support (BOS);Solid surface;20 secs   incr sway (esp left) with bumping into walls on either side   Tandem Gait  Forward;Intermittent upper extremity support;4 reps        PT Education - 03/12/18 2005    Education Details  reprinted the 2 exercises she's been given for HEP and asked pt to read instructions and demonstrate how she is doing exercises at home; 1st ex she was able to complete correctly however was unable to find how often or # reps she should do; 2nd exercise was able to read and do correctly and when asked how often and #reps and she was able to find this information    Person(s) Educated  Patient    Methods  Explanation;Demonstration;Verbal cues;Handout    Comprehension  Verbalized understanding;Returned demonstration;Verbal cues required;Need further instruction       PT Short Term Goals - 03/07/18 1430      PT SHORT TERM GOAL #1   Title  Patient  to be independent with HEP for vestibualr rehab and headache symptoms and balance.  TARGET 03/02/18    Baseline  no HEP has been set to date    Time  4  Period  Weeks    Status  On-going      PT SHORT TERM GOAL #2   Title  Pt will improve DGI score to at least 15/24 for decreased fall risk.    Baseline  14/24    Time  4    Period  Weeks    Status  Partially Met      PT SHORT TERM GOAL #3   Title  Pt will improve TUG score to less than or equal to 13.5 seconds for decreased fall risk.    Baseline  13.5    Time  4    Period  Weeks    Status  Achieved      PT SHORT TERM GOAL #4   Title  Pt/family will verbalize understanding of fall prevention in home environment.    Baseline  pt unable to recall recommendations    Time  4    Period  Weeks    Status  Not Met        PT Long Term Goals - 03/07/18 1557      PT LONG TERM GOAL #1   Title  patient to be independent with advanced HEP to address vestibular system and balance.  (Goals delayed to 2/14 due to multiple weeks of missed visits)    Period  Weeks    Status  New    Target Date  04/06/18      PT LONG TERM GOAL #2   Title  patient to report reduction in headache frequency, intensity, duration by >/= 50%, for improved participation in ADLs.    Time  6    Period  Weeks    Status  New    Target Date  04/06/18      PT LONG TERM GOAL #3   Title  patient to improve DGI to at least 19/24 for demonstrating reduced fall risk    Time  6    Period  Weeks    Status  New    Target Date  04/06/18      PT LONG TERM GOAL #4   Title  Pt will ambulate at least 1000 ft, indoors and outdoors, independently, no LOB, for return to community gait activities.    Time  6    Period  Weeks    Status  New    Target Date  04/06/18            Plan - 03/12/18 2011    Clinical Impression Statement  Session initially focused on pt's ability to do assigned HEP correctly (she showed correct technique and needed assist to understand how  many times per week, times per day, sets and reps). Further vestibular rehab attempted with pt able to follow instructions for VORx1 and pencil pushups. Her headache did not increase with rest period between each activity. Patient reports she doesn't like her personality now and gives example of easily "ticked off." Also embarrassed by her decr memory and chooses not to engage in conversations (isolates herself). As pt moved from PT to SLP session, discussed this information with Glendell Docker, SLP with ?if pt could benefit from neuropsychology. He reported pt would need dedicated family member(s) who would attend with her due to severely impaired memory. (Later noticed pt has been tested by Dr. Sima Matas with 2 more appts set for pt with him).     Rehab Potential  Good    Clinical Impairments Affecting Rehab Potential  Severity of (cognitive) deficits following injury; family involvement (sister  working, has possible hired caregiver for home)    PT Frequency  Other (comment)   3x/wk for 1 week, then 2x/wk for 5 weeks   PT Treatment/Interventions  ADLs/Self Care Home Management;Gait training;Functional mobility training;Therapeutic activities;Therapeutic exercise;Patient/family education;Neuromuscular re-education;Balance training;Canalith Repostioning;Visual/perceptual remediation/compensation;Vestibular    PT Next Visit Plan  ?did she get a memory notebook? (SLP has asked family to get one); Have her sister come back to watch HEP.  Add to and finalize HEP for balance, habituation, oculomotor, gaze stability in sitting    PT Home Exercise Plan  [id number] ([employer] started but not finalized)    Consulted and Agree with Plan of Care  Patient    Family Member Consulted  --       Patient will benefit from skilled therapeutic intervention in order to improve the following deficits and impairments:  Abnormal gait, Decreased balance, Decreased safety awareness, Decreased mobility, Difficulty walking, Postural  dysfunction, Dizziness, Decreased activity tolerance  Visit Diagnosis: Unsteadiness on feet  Other abnormalities of gait and mobility     Problem List Patient Active Problem List   Diagnosis Date Noted  . Anterograde amnesia   . Postconcussive syndrome 01/01/2018  . Asthma   . Essential hypertension   . Diabetes mellitus type 2 in obese ([place])   . Dyslipidemia   . Vascular headache   . Chronic post-traumatic headache, not intractable 11/20/2017  . Post concussion syndrome 11/20/2017  . Shortness of breath on exertion 05/25/2017  . Other fatigue 05/25/2017  . Type 2 diabetes mellitus without complication, without long-term current use of insulin ([place]) 05/25/2017  . Uncontrolled hypertension 05/25/2017  . Chronic migraine without aura, with intractable migraine, so stated, with status migrainosus 03/20/2017  . S/P hysterectomy 07/16/2013    Rexanne Mano, PT 03/12/2018, 8:18 PM  [facility] [address] [place], [place], [address] Phone: [phone]   Fax:  [phone]  Name: SUETTA HOFFMEISTER MRN: [id number] Date of Birth: 12-07-1970

## 2018-03-16 ENCOUNTER — Ambulatory Visit: Payer: 59

## 2018-03-16 ENCOUNTER — Encounter: Payer: Self-pay | Admitting: Physical Therapy

## 2018-03-16 ENCOUNTER — Ambulatory Visit: Payer: 59 | Admitting: Physical Therapy

## 2018-03-16 DIAGNOSIS — R2689 Other abnormalities of gait and mobility: Secondary | ICD-10-CM

## 2018-03-16 DIAGNOSIS — R42 Dizziness and giddiness: Secondary | ICD-10-CM

## 2018-03-16 DIAGNOSIS — R2681 Unsteadiness on feet: Secondary | ICD-10-CM | POA: Diagnosis not present

## 2018-03-16 NOTE — Therapy (Signed)
[facility] [address] [place], [place], [address] Phone: [phone]   Fax:  [phone]  Physical Therapy Treatment  Patient Details  Name: Bridget Anderson MRN: [id number] Date of Birth: 1970/03/30 Referring Provider (PT): Dr. Oval Linsey   Encounter Date: 03/16/2018  PT End of Session - 03/16/18 1230    Visit Number  5    Number of Visits  13    Date for PT Re-Evaluation  04/06/18    Authorization Type  Workers Comp    PT Start Time  1230    PT Stop Time  1317    PT Time Calculation (min)  47 min    Activity Tolerance  Treatment limited secondary to medical complications (Comment)   limited by incr headache and nausea   Behavior During Therapy  Flat affect       Past Medical History:  Diagnosis Date  . Chlamydia   . Diabetes ([place])   . GERD (gastroesophageal reflux disease)   . Hyperlipidemia   . Hypertension   . Migraine   . Obstructive sleep apnea on CPAP    pt has not worn CPAP in years  . Seasonal allergies   . Wears glasses     Past Surgical History:  Procedure Laterality Date  . ABDOMINAL HYSTERECTOMY N/A 07/16/2013   Procedure: HYSTERECTOMY ABDOMINAL;  Surgeon: Allyn Kenner, DO;  Location: [facility];  Service: Gynecology;  Laterality: N/A;  . BILATERAL SALPINGECTOMY Bilateral 07/16/2013   Procedure: BILATERAL SALPINGECTOMY;  Surgeon: Allyn Kenner, DO;  Location: [facility];  Service: Gynecology;  Laterality: Bilateral;  . CARPAL TUNNEL RELEASE  01/16/2012   Procedure: CARPAL TUNNEL RELEASE;  Surgeon: Nita Sells, MD;  Location: [facility];  Service: Orthopedics;  Laterality: Left;  . CARPAL TUNNEL RELEASE  02/20/2012   Procedure: CARPAL TUNNEL RELEASE;  Surgeon: Nita Sells, MD;  Location: [facility];  Service: Orthopedics;  Laterality: Right;  . COLONOSCOPY    . CYSTO WITH HYDRODISTENSION N/A 07/16/2013   Procedure: CYSTOSCOPY/HYDRODISTENSION;   Surgeon: Reece Packer, MD;  Location: [facility];  Service: Urology;  Laterality: N/A;  . DILATION AND CURETTAGE OF UTERUS    . FOOT SURGERY    . HYSTEROSCOPY W/D&C  10/31/2011   Procedure: DILATATION AND CURETTAGE /HYSTEROSCOPY;  Surgeon: Allyn Kenner, DO;  Location: [facility];  Service: Gynecology;  Laterality: N/A;  . LAPAROSCOPY N/A 07/16/2013   Procedure: LAPAROSCOPY DIAGNOSTIC;  Surgeon: Allyn Kenner, DO;  Location: [facility];  Service: Gynecology;  Laterality: N/A;  . PLANTAR FASCIA SURGERY    . PUBOVAGINAL SLING N/A 07/16/2013   Procedure: Gaynelle Arabian;  Surgeon: Reece Packer, MD;  Location: [facility];  Service: Urology;  Laterality: N/A;  . TUBAL LIGATION    . [place],  2003    There were no vitals filed for this visit.  Subjective Assessment - 03/16/18 1230    Subjective  I feel like my family is helping me too much and does not involve me in decisions that affect me.     Patient is accompained by:  Family member   sister, [name]   Pertinent History  Pt hit by 85# pipe at work June 2019, post-concussive syndrome,memory deficits    Patient Stated Goals  Pt's goal is to stop hurting (02/05/18) (regarding headaches)    Currently in Pain?  Yes    Pain Score  4     Pain Location  Head  Pain Descriptors / Indicators  Headache    Pain Type  Chronic pain    Pain Onset  More than a month ago   have them all the time   Pain Frequency  Constant    Aggravating Factors   frustrating     Pain Relieving Factors  sleeping                       [facility] Adult PT Treatment/Exercise - 03/16/18 1937      Self-Care   Self-Care  Other Self-Care Comments    Other Self-Care Comments   initial portion of session spent discussing importance of attending with a family member present and with her memory notebook (pt arrived 30 minutes late for SLP session prior to PT session); discussed that some exercises important for her recovery are a bit complex and will  HAVE to have someone to guide her through the exercises for maximum recovery.       Vestibular Treatment/Exercise - 03/16/18 1933      Vestibular Treatment/Exercise   Vestibular Treatment Provided  Gaze    Gaze Exercises  X1 Viewing Horizontal;X1 Viewing Vertical      X1 Viewing Horizontal   Foot Position  apart    Time  --   20-30 sec   Reps  3    Comments  vc for technique; educated sister in technique so she can cue pt at home      X1 Viewing Vertical   Foot Position  apart, could not tolerate >10 seconds and moved to sitting     Time  --   <10 sec in standing; 15-20 sec seated   Reps  2    Comments  pt reported incr headache and "stomach hurting" and did not try 3rd rep         Balance Exercises - 03/16/18 1935      Balance Exercises: Standing   Standing Eyes Closed  Narrow base of support (BOS);Solid surface;20 secs;Head turns    Tandem Gait  Forward;Intermittent upper extremity support;2 reps   back of hand along wall       PT Education - 03/16/18 1942    Education Details  See self care for details discussed re: family support and assist with exercises; reprinted ex handout so sister could provide instruction to pt re: 2 initial exercises and then educated on VOR x1    Person(s) Educated  Patient;Other (comment)   sister, [name]   Methods  Explanation;Demonstration;Verbal cues;Handout    Comprehension  Verbalized understanding;Returned demonstration;Verbal cues required;Need further instruction       PT Short Term Goals - 03/07/18 1430      PT SHORT TERM GOAL #1   Title  Patient to be independent with HEP for vestibualr rehab and headache symptoms and balance.  TARGET 03/02/18    Baseline  no HEP has been set to date    Time  4    Period  Weeks    Status  On-going      PT SHORT TERM GOAL #2   Title  Pt will improve DGI score to at least 15/24 for decreased fall risk.    Baseline  14/24    Time  4    Period  Weeks    Status  Partially Met      PT  SHORT TERM GOAL #3   Title  Pt will improve TUG score to less than or equal to 13.5 seconds for decreased fall  risk.    Baseline  13.5    Time  4    Period  Weeks    Status  Achieved      PT SHORT TERM GOAL #4   Title  Pt/family will verbalize understanding of fall prevention in home environment.    Baseline  pt unable to recall recommendations    Time  4    Period  Weeks    Status  Not Met        PT Long Term Goals - 03/07/18 1557      PT LONG TERM GOAL #1   Title  patient to be independent with advanced HEP to address vestibular system and balance.  (Goals delayed to 2/14 due to multiple weeks of missed visits)    Period  Weeks    Status  New    Target Date  04/06/18      PT LONG TERM GOAL #2   Title  patient to report reduction in headache frequency, intensity, duration by >/= 50%, for improved participation in ADLs.    Time  6    Period  Weeks    Status  New    Target Date  04/06/18      PT LONG TERM GOAL #3   Title  patient to improve DGI to at least 19/24 for demonstrating reduced fall risk    Time  6    Period  Weeks    Status  New    Target Date  04/06/18      PT LONG TERM GOAL #4   Title  Pt will ambulate at least 1000 ft, indoors and outdoors, independently, no LOB, for return to community gait activities.    Time  6    Period  Weeks    Status  New    Target Date  04/06/18            Plan - 03/16/18 1948    Clinical Impression Statement  Session focused on pt/family education re: purpose and importance of adhering to exercises in HEP. Sister did very well reading directions aloud while pt performed exercises. Instructed in VORx1 which pt could not fully tolerate in standing, therefore instructed for home program to do in sitting. Increased time reviewing instruction with pt's sister. Anticipate slower progress due to >6 months since injury and very poor memory.     Rehab Potential  Good    Clinical Impairments Affecting Rehab Potential  Severity of  (cognitive) deficits following injury; family involvement (sister working, has possible hired caregiver for home)    PT Frequency  Other (comment)   3x/wk for 1 week, then 2x/wk for 5 weeks   PT Treatment/Interventions  ADLs/Self Care Home Management;Gait training;Functional mobility training;Therapeutic activities;Therapeutic exercise;Patient/family education;Neuromuscular re-education;Balance training;Canalith Repostioning;Visual/perceptual remediation/compensation;Vestibular    PT Next Visit Plan  did she bring her memory notebook? (SLP has asked family to get one); Have her sister (and her aide, if present) come back to watch/learn HEP.  Add to and finalize HEP for balance, habituation, oculomotor, gaze stability in sitting    PT Home Exercise Plan  [id number] ([employer] started but not finalized)    Consulted and Agree with Plan of Care  Patient;Family member/caregiver    Family Member Consulted  sister, [name]       Patient will benefit from skilled therapeutic intervention in order to improve the following deficits and impairments:  Abnormal gait, Decreased balance, Decreased safety awareness, Decreased mobility, Difficulty walking, Postural dysfunction, Dizziness, Decreased activity tolerance  Visit Diagnosis: Unsteadiness on feet  Other abnormalities of gait and mobility  Dizziness and giddiness     Problem List Patient Active Problem List   Diagnosis Date Noted  . Anterograde amnesia   . Postconcussive syndrome 01/01/2018  . Asthma   . Essential hypertension   . Diabetes mellitus type 2 in obese ([place])   . Dyslipidemia   . Vascular headache   . Chronic post-traumatic headache, not intractable 11/20/2017  . Post concussion syndrome 11/20/2017  . Shortness of breath on exertion 05/25/2017  . Other fatigue 05/25/2017  . Type 2 diabetes mellitus without complication, without long-term current use of insulin ([place]) 05/25/2017  . Uncontrolled hypertension 05/25/2017  . Chronic  migraine without aura, with intractable migraine, so stated, with status migrainosus 03/20/2017  . S/P hysterectomy 07/16/2013    Rexanne Mano, PT 03/16/2018, 7:54 PM  [facility] [address] [place], [place], [address] Phone: [phone]   Fax:  [phone]  Name: Bridget Anderson MRN: [id number] Date of Birth: August 09, 1970

## 2018-03-16 NOTE — Patient Instructions (Addendum)
Gaze Stabilization: Tip Card  1.Target must remain in focus, not blurry, and appear stationary while head is in motion. 2.Perform exercises with small head movements (30 to either side of midline). 3.Increase speed of head motion so long as target is in focus. 4.If you wear eyeglasses, be sure you can see target through lens (therapist will give specific instructions for bifocal / progressive lenses). 5.These exercises should provoke dizziness or nausea. Work through these symptoms. If too dizzy, slow head movement slightly. (Too dizzy is moderately dizzy or 5 out of 10 with a 10 meaning you're so dizzy you are falling).  Overall the symptoms of dizziness or nausea should resolve within 30 minutes of completing all repetitions. 6.Exercises demand concentration; avoid distractions. Begin with the target on a simple background unless instructed otherwise.      Special Instructions:  If symptoms are lasting longer than 30 minutes, modify your exercises by:    >decreasing the # of times you complete each activity >ensuring your symptoms return to baseline before moving onto the next exercise >dividing up exercises so you do not do them all in one session, but multiple short sessions throughout the day >doing them once a day until symptoms improve   Gaze Stabilization: Sitting    Keeping eyes on target on wall 3 feet away, and move head side to side for _30-60___ seconds. Rest and repeat side to side. Then repeat while moving head up and down for __30-60__ seconds. Rest and repeat up and down. (ie do each direction two times)  Do __2-3__ sessions per day.

## 2018-03-22 ENCOUNTER — Encounter: Payer: Self-pay | Admitting: Psychology

## 2018-03-22 NOTE — Progress Notes (Signed)
Neuropsychological Consultation   Patient:   Bridget Anderson   DOB:   December 03, 1970  MR Number:  [id number]  Location:  [facility] [place], [place] [id number] [place] [address] Dept: [phone]           Date of Service:   02/16/2018  Start Time:   10 AM End Time:   11 AM  Provider/Observer:  Ilean Skill, Psy.D.       Clinical Neuropsychologist       Billing Code/Service: Neurobehavioral status exam  Chief Complaint:    Bridget Anderson is a 48 year old right-handed female who was referred for neuropsychological follow-up following discharge from the inpatient rehabilitation unit.  The patient has a prior history of hypertension, migraine headaches, and diabetes.  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  The patient has continued to have profound memory deficits for new events.  The patient does remember past events but takes longer to recall information but has little to no ability to learn new events and new information.  The patient remembers her children from past events and past knowledge but is not learning new information.  Reason for Service:  Bridget Anderson a 48 year old right-handed female with a history of hypertension, migraine headaches, and diabetes.  The patient was involved in a work-related accident on 08/05/2017 when a 85 pound pipe fell and struck the patient on the head.  The patient reports that she has been told she was standing and cleaning machinery at work when the pipe fell and hit her in the head.  The patient reports that she does have memory and recall for most of what happened prior to being struck on the head.  The patient reports that she remembers feeling the pipe hit her and that she could "feel it in her teeth."  During the  interview while she was on the inpatient unit the patient reported that she initially could not see or hear anything but oriented to self leaning over the pipe and was an area by herself.  The patient reports that she called someone on her cell phone to come and assist.  The patient reports that she does not think she had a full loss of consciousness but there was an altered consciousness.  When team members came down they ended up driving her to the hospital.  The patient reports that she remembers being in a great deal of pain and remembers the events in the emergency department and that she was seen in the emergency department for a long time.  The patient reports that she remembers the night at home after it happened quite clearly but when she woke up in the morning everything is far as her mood and remembering new events that her memory and recall was fuzzy for any new event.  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  The patient and her sister reports that the patient is remembering all the events although having some delayed speed in her recall.  There also some minor word finding issues noted.  During her emergency department presentation at [facility] she was complaining of nausea and vertigo with headache. She acknowledged that she did not recall the full events of the accident. CT scan  of the head was reviewed from that time and was unremarkable for intracranial process. She was treated for a scalp contusion and then released. There was only transient loss of vision and hearing and no complete loss of consciousness. The patient again presented to the emergency department on 08/08/2017 with increased headache, nausea, dizziness and blurred vision. She was also experiencing ringing in her ears. The patient has continued to report persistent headaches as well as bouts of memory loss. The patient describes her memory  difficulties related to any new event. She reports that she will meet with 1 of her doctors and be able to follow along and remember the conversation while it is active. However, once the interaction with others have been completed she quickly loses memory of what is been happening or what has transpired. The patient has trouble remembering any new event but is well oriented and with good executive functioning during the event itself.  The patient did [facility] her [facility] The impressions of this MRI showed no acute findings and there were no indications of acute or remote evidence of intracranial hemorrhage.  Now that the patient is out of the inpatient unit she is desperately trying to get back to work and is having difficulty acknowledging the degree of her memory deficits.  The patient reports that she is having headaches all of the time now and her sister and patient acknowledge a profound memory deficit for new events.  There are also some changes in personality and temperament noted.  Current Status:  The patient continues to have profound retrograde amnesia.  The patient is unable to learn any new information or very little information and when pressed will sometimes confabulate and attempts to try to describe what is happened recently but it is clear that she is doing nothing more than gassing based on judgment and estimation about what should or could happened.  Reliability of Information: The information is derived from 1 hour face-to-face clinical interview, review of available medical records as well as review of records of my 2 previous visits with the patient while she was on the inpatient unit.  Behavioral Observation: SHAKERA EBRAHIMI  presents as a 48 y.o.-year-old Right African American Female who appeared her stated age. her dress was Appropriate and she was Well Groomed and her manners were Appropriate to the situation.  her  participation was indicative of Appropriate and Redirectable behaviors.  There were not any physical disabilities noted.  she displayed an appropriate level of cooperation and motivation.     Interactions:    Active Appropriate, Inattentive and Redirectable  Attention:   abnormal and attention span appeared shorter than expected for age  Memory:   abnormal; remote memory intact, recent memory impaired  Visuo-spatial:  not examined  Speech (Volume):  low  Speech:   normal; slowed response style  Thought Process:  Coherent and Relevant  Though Content:  WNL; not suicidal and not homicidal  Orientation:   person, place and time/date  Judgment:   Poor  Planning:   Poor  Affect:    Anxious and Depressed  Mood:    Dysphoric  Insight:   Lacking  Intelligence:   normal  Marital Status/Living: The patient was born in [place] and has 3 siblings.  She is currently living with her youngest daughter.  The patient is single.  She has a 52 year old daughter and a 85 year old daughter.  Current Employment: The patient is not currently working  and has not worked since her accident.  Past Employment:  The patient is worked for [employer] for the past 13 years right up until the time of her accident at work.  Substance Use:  No concerns of substance abuse are reported.    Education:   Engineering geologist History:   Past Medical History:  Diagnosis Date  . Chlamydia   . Diabetes ([place])   . GERD (gastroesophageal reflux disease)   . Hyperlipidemia   . Hypertension   . Migraine   . Obstructive sleep apnea on CPAP    pt has not worn CPAP in years  . Seasonal allergies   . Wears glasses        Abuse/Trauma History: The patient denies any prior traumatic history beyond the events at work where she was struck on the head by an 85 pound pipe.  Psychiatric History:  Patient has no prior psychiatric history.  Family Med/Psych History:  Family History  Problem Relation  Age of Onset  . Hypertension Mother   . Hyperlipidemia Mother   . Hypertension Father   . Hyperlipidemia Father   . Thyroid disease Father   . Hypertension Brother     Risk of Suicide/Violence: low the patient denies any suicidal or homicidal ideation.  Impression/DX:  Laterria Lasota a 48 year old right-handed female with a history of hypertension, migraine headaches, and diabetes.  The patient was involved in a work-related accident on 08/05/2017 when a 85 pound pipe fell and struck the patient on the head.  The patient reports that she has been told she was standing and cleaning machinery at work when the pipe fell and hit her in the head.  The patient reports that she does have memory and recall for most of what happened prior to being struck on the head.  The patient reports that she remembers feeling the pipe hit her and that she could "feel it in her teeth."  During the interview while she was on the inpatient unit the patient reported that she initially could not see or hear anything but oriented to self leaning over the pipe and was an area by herself.  The patient reports that she called someone on her cell phone to come and assist.  The patient reports that she does not think she had a full loss of consciousness but there was an altered consciousness.  When team members came down they ended up driving her to the hospital.  The patient reports that she remembers being in a great deal of pain and remembers the events in the emergency department and that she was seen in the emergency department for a long time.  The patient reports that she remembers the night at home after it happened quite clearly but when she woke up in the morning everything is far as her mood and remembering new events that her memory and recall was fuzzy for any new event.  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  The patient and her sister reports that the  patient is remembering all the events although having some delayed speed in her recall.  There also some minor word finding issues noted.  During her emergency department presentation at [facility] she was complaining of nausea and vertigo with headache. She acknowledged that she did not recall the full events of the accident. CT scan of the head was reviewed from that time and was unremarkable for intracranial  process. She was treated for a scalp contusion and then released. There was only transient loss of vision and hearing and no complete loss of consciousness. The patient again presented to the emergency department on 08/08/2017 with increased headache, nausea, dizziness and blurred vision. She was also experiencing ringing in her ears. The patient has continued to report persistent headaches as well as bouts of memory loss. The patient describes her memory difficulties related to any new event. She reports that she will meet with 1 of her doctors and be able to follow along and remember the conversation while it is active. However, once the interaction with others have been completed she quickly loses memory of what is been happening or what has transpired. The patient has trouble remembering any new event but is well oriented and with good executive functioning during the event itself.  The patient did [facility] The impressions of this MRI showed no acute findings and there were no indications of acute or remote evidence of intracranial hemorrhage.  Now that the patient is out of the inpatient unit she is desperately trying to get back to work and is having difficulty acknowledging the degree of her memory deficits.  The patient reports that she is having headaches all of the time now and her sister and patient acknowledge a profound memory deficit for new events.  There are also some changes in personality and  temperament noted.  The patient continues to have profound retrograde amnesia.  The patient is unable to learn any new information or very little information and when pressed will sometimes confabulate and attempts to try to describe what is happened recently but it is clear that she is doing nothing more than gassing based on judgment and estimation about what should or could happened.   Disposition/Plan:  We have set the patient up for individual psychotherapeutic and neuropsychological interventions to try to cope with the profound memory and learning deficits that are residual posterior injury.  It is unclear what the specific etiological factors are for her profound memory deficits but they are in some way related to the residual effects of her postconcussion syndrome and possible vascular event happening within 24 hours of the accident.  Diagnosis:    Post concussion syndrome  Vascular headache  Chronic post-traumatic headache, not intractable  Memory deficits         Electronically Signed   _______________________ Ilean Skill, Psy.D.

## 2018-03-23 ENCOUNTER — Telehealth: Payer: Self-pay | Admitting: *Deleted

## 2018-03-23 DIAGNOSIS — E7849 Other hyperlipidemia: Secondary | ICD-10-CM

## 2018-03-23 MED ORDER — GLIPIZIDE ER 5 MG PO TB24: 5.0000 mg | ORAL_TABLET | Freq: Every day | ORAL | 0 refills | Status: DC

## 2018-03-23 NOTE — Telephone Encounter (Signed)
Ms Tapia sister has just discovered by filling her med box that Jermaine has not been taking her glucotrol XL since discharge.  Somehow the pharmacy did not get the order and fill it for her because they were out of the medication.  Melissa CM for [facility] is asking that we send in an rx to the pharmacy [facility] on [address].  It looks like Chauncey was d/c in November.  I cannot see if she has followed up with her PCP. In speaking with her Case Manager Melissa with [facility], she needs a list of her medications that she can go out and do some teaching with the pt (provided by fax). I told her I would call in one month of the Glucatrol XL since the pharmacy did not have the medication at the time of discharge and it was never filled off the Rx given at discharge, and I will make sure pharmacy knows further questions and refills have to go through Dr Woody Seller her PCP.

## 2018-03-30 ENCOUNTER — Ambulatory Visit: Payer: Self-pay | Admitting: Psychology

## 2018-04-04 ENCOUNTER — Ambulatory Visit: Payer: No Typology Code available for payment source

## 2018-04-04 ENCOUNTER — Ambulatory Visit
Payer: No Typology Code available for payment source | Attending: Physical Medicine & Rehabilitation | Admitting: Occupational Therapy

## 2018-04-04 ENCOUNTER — Ambulatory Visit: Payer: No Typology Code available for payment source | Admitting: Rehabilitative and Restorative Service Providers"

## 2018-04-04 VITALS — BP 160/98

## 2018-04-04 DIAGNOSIS — R41840 Attention and concentration deficit: Secondary | ICD-10-CM

## 2018-04-04 DIAGNOSIS — R2689 Other abnormalities of gait and mobility: Secondary | ICD-10-CM

## 2018-04-04 DIAGNOSIS — R41844 Frontal lobe and executive function deficit: Secondary | ICD-10-CM | POA: Diagnosis present

## 2018-04-04 DIAGNOSIS — R41842 Visuospatial deficit: Secondary | ICD-10-CM | POA: Insufficient documentation

## 2018-04-04 DIAGNOSIS — M6281 Muscle weakness (generalized): Secondary | ICD-10-CM | POA: Diagnosis present

## 2018-04-04 DIAGNOSIS — R42 Dizziness and giddiness: Secondary | ICD-10-CM

## 2018-04-04 DIAGNOSIS — R41841 Cognitive communication deficit: Secondary | ICD-10-CM

## 2018-04-04 DIAGNOSIS — R2681 Unsteadiness on feet: Secondary | ICD-10-CM

## 2018-04-04 NOTE — Therapy (Signed)
[facility] [address] [place], [place], [address] Phone: [phone]   Fax:  [phone]  Physical Therapy Treatment  Patient Details  Name: Bridget Anderson MRN: [id number] Date of Birth: 11-14-1970 Referring Provider (PT): Dr. Oval Linsey   Encounter Date: 04/04/2018  PT End of Session - 04/04/18 1427    Visit Number  6    Number of Visits  13    Date for PT Re-Evaluation  04/06/18    Authorization Type  Workers Comp    PT Start Time  1320    PT Stop Time  1400    PT Time Calculation (min)  40 min    Activity Tolerance  Treatment limited secondary to medical complications (Comment)   limited by incr headache   Behavior During Therapy  --   unable to remember items during therapy      Past Medical History:  Diagnosis Date  . Chlamydia   . Diabetes ([place])   . GERD (gastroesophageal reflux disease)   . Hyperlipidemia   . Hypertension   . Migraine   . Obstructive sleep apnea on CPAP    pt has not worn CPAP in years  . Seasonal allergies   . Wears glasses     Past Surgical History:  Procedure Laterality Date  . ABDOMINAL HYSTERECTOMY N/A 07/16/2013   Procedure: HYSTERECTOMY ABDOMINAL;  Surgeon: Allyn Kenner, DO;  Location: [facility];  Service: Gynecology;  Laterality: N/A;  . BILATERAL SALPINGECTOMY Bilateral 07/16/2013   Procedure: BILATERAL SALPINGECTOMY;  Surgeon: Allyn Kenner, DO;  Location: [facility];  Service: Gynecology;  Laterality: Bilateral;  . CARPAL TUNNEL RELEASE  01/16/2012   Procedure: CARPAL TUNNEL RELEASE;  Surgeon: Nita Sells, MD;  Location: [facility];  Service: Orthopedics;  Laterality: Left;  . CARPAL TUNNEL RELEASE  02/20/2012   Procedure: CARPAL TUNNEL RELEASE;  Surgeon: Nita Sells, MD;  Location: [facility];  Service: Orthopedics;  Laterality: Right;  . COLONOSCOPY    . CYSTO WITH HYDRODISTENSION N/A 07/16/2013   Procedure:  CYSTOSCOPY/HYDRODISTENSION;  Surgeon: Reece Packer, MD;  Location: [facility];  Service: Urology;  Laterality: N/A;  . DILATION AND CURETTAGE OF UTERUS    . FOOT SURGERY    . HYSTEROSCOPY W/D&C  10/31/2011   Procedure: DILATATION AND CURETTAGE /HYSTEROSCOPY;  Surgeon: Allyn Kenner, DO;  Location: [facility];  Service: Gynecology;  Laterality: N/A;  . LAPAROSCOPY N/A 07/16/2013   Procedure: LAPAROSCOPY DIAGNOSTIC;  Surgeon: Allyn Kenner, DO;  Location: [facility];  Service: Gynecology;  Laterality: N/A;  . PLANTAR FASCIA SURGERY    . PUBOVAGINAL SLING N/A 07/16/2013   Procedure: Gaynelle Arabian;  Surgeon: Reece Packer, MD;  Location: [facility];  Service: Urology;  Laterality: N/A;  . TUBAL LIGATION    . [place],  2003    Vitals:   04/04/18 1330  BP: (!) 160/98    Subjective Assessment - 04/04/18 1330    Subjective  The patient notes she has a headache every day.  She is unable to remember if she has done HEP.  Ebony, her caregiver, states they have been more focused on taking blood pressure 2x/day, blood sugar 2x/day Tues-Friday.     Pertinent History  Pt hit by 85# pipe at work June 2019, post-concussive syndrome,memory deficits    Patient Stated Goals  Pt's goal is to stop hurting (02/05/18) (regarding headaches)    Currently in Pain?  Yes    Pain  Score  6    "I just want to lay down."   Pain Location  Head    Pain Descriptors / Indicators  Headache    Pain Type  Chronic pain    Pain Onset  More than a month ago    Pain Frequency  Constant             Vestibular Assessment - 04/04/18 1337      Symptom Behavior   Type of Dizziness  Spinning    Frequency of Dizziness  daily               [facility] Adult PT Treatment/Exercise - 04/04/18 1428      Self-Care   Self-Care  Other Self-Care Comments    Other Self-Care Comments   Patient forgot memory notebook.  PT recommended to [facility], caregiver, that she perform HEP with patient 1-2 times/day  Tues-Friday when she is present.  We reviewed each activity.        Neuro Re-ed    Neuro Re-ed Details   tandem walking near support surface- patient able to complete without loss of balance without UE support.      Vestibular Treatment/Exercise - 04/04/18 1339      Vestibular Treatment/Exercise   Vestibular Treatment Provided  Gaze    Habituation Exercises  Standing Horizontal Head Turns    Gaze Exercises  X1 Viewing Horizontal;X1 Viewing Vertical;Eye/Head Exercise Horizontal      Standing Horizontal Head Turns   Number of Reps   10    Symptom Description   standing in corner with feet together and head turns with eyes open; reviewed with Ebony, caregiver to improve carryover to home      X1 Viewing Horizontal   Foot Position  seated    Reps  2    Comments  demonstration; performed at greater distance of 6-8 feet to imrpove vision.  Performed x 10 seconds.      X1 Viewing Vertical   Foot Position  seated    Reps  2    Comments  Gaze exercise increases HA in both planes.  Performed at 6-8 feet.      Eye/Head Exercise Horizontal   Foot Position  seated    Comments  eye/head corrective saccades to targets            PT Education - 04/04/18 1426    Education Details  HEP: modified gaze x 1 adaptation to perform at 6-8 ft due to dec'd convergence (still can only tolerate 10 seconds with inc'd headache).  Seated corrective saccades, standing corner head motion, tandem ait.    Person(s) Educated  Patient;Caregiver(s)    Methods  Explanation;Demonstration;Handout    Comprehension  Verbalized understanding;Returned demonstration;Need further instruction       PT Short Term Goals - 03/07/18 1430      PT SHORT TERM GOAL #1   Title  Patient to be independent with HEP for vestibualr rehab and headache symptoms and balance.  TARGET 03/02/18    Baseline  no HEP has been set to date    Time  4    Period  Weeks    Status  On-going      PT SHORT TERM GOAL #2   Title  Pt will  improve DGI score to at least 15/24 for decreased fall risk.    Baseline  14/24    Time  4    Period  Weeks    Status  Partially Met  PT SHORT TERM GOAL #3   Title  Pt will improve TUG score to less than or equal to 13.5 seconds for decreased fall risk.    Baseline  13.5    Time  4    Period  Weeks    Status  Achieved      PT SHORT TERM GOAL #4   Title  Pt/family will verbalize understanding of fall prevention in home environment.    Baseline  pt unable to recall recommendations    Time  4    Period  Weeks    Status  Not Met        PT Long Term Goals - 03/07/18 1557      PT LONG TERM GOAL #1   Title  patient to be independent with advanced HEP to address vestibular system and balance.  (Goals delayed to 2/14 due to multiple weeks of missed visits)    Period  Weeks    Status  New    Target Date  04/06/18      PT LONG TERM GOAL #2   Title  patient to report reduction in headache frequency, intensity, duration by >/= 50%, for improved participation in ADLs.    Time  6    Period  Weeks    Status  New    Target Date  04/06/18      PT LONG TERM GOAL #3   Title  patient to improve DGI to at least 19/24 for demonstrating reduced fall risk    Time  6    Period  Weeks    Status  New    Target Date  04/06/18      PT LONG TERM GOAL #4   Title  Pt will ambulate at least 1000 ft, indoors and outdoors, independently, no LOB, for return to community gait activities.    Time  6    Period  Weeks    Status  New    Target Date  04/06/18            Plan - 04/04/18 1431    Clinical Impression Statement  Today's session focused on reviewing gaze x 1 exercise with caregiver, reviewing prior balance exercise with pateint and caregiver, and adding seated occulomotor for eye/head coordination.  PT recommended daily performance to work through symptoms.  Patient's participation limited due to presence of HA (worsened today by all activities), and her memory.      Clinical  Impairments Affecting Rehab Potential  Severity of (cognitive) deficits following injury; family involvement (sister working, has possible hired caregiver for home)    PT Treatment/Interventions  ADLs/Self Care Home Management;Gait training;Functional mobility training;Therapeutic activities;Therapeutic exercise;Patient/family education;Neuromuscular re-education;Balance training;Canalith Repostioning;Visual/perceptual remediation/compensation;Vestibular    PT Next Visit Plan  Check LTGs, review HEP, *do not anticipate HEP can be progressed until patient works on regularly with family/nurse's aide    Consulted and Agree with Plan of Care  Patient       Patient will benefit from skilled therapeutic intervention in order to improve the following deficits and impairments:  Abnormal gait, Decreased balance, Decreased safety awareness, Decreased mobility, Difficulty walking, Postural dysfunction, Dizziness, Decreased activity tolerance  Visit Diagnosis: Other abnormalities of gait and mobility  Dizziness and giddiness  Unsteadiness on feet     Problem List Patient Active Problem List   Diagnosis Date Noted  . Anterograde amnesia   . Postconcussive syndrome 01/01/2018  . Asthma   . Essential hypertension   . Diabetes mellitus type 2 in  obese ([place])   . Dyslipidemia   . Vascular headache   . Chronic post-traumatic headache, not intractable 11/20/2017  . Post concussion syndrome 11/20/2017  . Shortness of breath on exertion 05/25/2017  . Other fatigue 05/25/2017  . Type 2 diabetes mellitus without complication, without long-term current use of insulin ([place]) 05/25/2017  . Uncontrolled hypertension 05/25/2017  . Chronic migraine without aura, with intractable migraine, so stated, with status migrainosus 03/20/2017  . S/P hysterectomy 07/16/2013    Bridget Anderson, PT 04/04/2018, 2:35 PM  [facility] [address] [place], [place], [address] Phone: [phone]   Fax:  [phone]  Name: Bridget Anderson MRN: [id number] Date of Birth: [birth year]

## 2018-04-04 NOTE — Therapy (Signed)
[facility] [address] [place], [place], [address] Phone: [phone]   Fax:  [phone]  Speech Language Pathology Treatment  Patient Details  Name: Bridget Anderson MRN: [id number] Date of Birth: 1970-10-22 Referring Provider (SLP): Alger Simons, MD   Encounter Date: 04/04/2018  End of Session - 04/04/18 1516    Visit Number  5    Number of Visits  25    Date for SLP Re-Evaluation  04/19/18    Authorization - Number of Visits  25    SLP Start Time  3546    SLP Stop Time   1531    SLP Time Calculation (min)  43 min    Activity Tolerance  Patient tolerated treatment well       Past Medical History:  Diagnosis Date  . Chlamydia   . Diabetes ([place])   . GERD (gastroesophageal reflux disease)   . Hyperlipidemia   . Hypertension   . Migraine   . Obstructive sleep apnea on CPAP    pt has not worn CPAP in years  . Seasonal allergies   . Wears glasses     Past Surgical History:  Procedure Laterality Date  . ABDOMINAL HYSTERECTOMY N/A 07/16/2013   Procedure: HYSTERECTOMY ABDOMINAL;  Surgeon: Allyn Kenner, DO;  Location: [facility];  Service: Gynecology;  Laterality: N/A;  . BILATERAL SALPINGECTOMY Bilateral 07/16/2013   Procedure: BILATERAL SALPINGECTOMY;  Surgeon: Allyn Kenner, DO;  Location: [facility];  Service: Gynecology;  Laterality: Bilateral;  . CARPAL TUNNEL RELEASE  01/16/2012   Procedure: CARPAL TUNNEL RELEASE;  Surgeon: Nita Sells, MD;  Location: [facility];  Service: Orthopedics;  Laterality: Left;  . CARPAL TUNNEL RELEASE  02/20/2012   Procedure: CARPAL TUNNEL RELEASE;  Surgeon: Nita Sells, MD;  Location: [facility];  Service: Orthopedics;  Laterality: Right;  . COLONOSCOPY    . CYSTO WITH HYDRODISTENSION N/A 07/16/2013   Procedure: CYSTOSCOPY/HYDRODISTENSION;  Surgeon: Reece Packer, MD;  Location: [facility];  Service: Urology;  Laterality: N/A;   . DILATION AND CURETTAGE OF UTERUS    . FOOT SURGERY    . HYSTEROSCOPY W/D&C  10/31/2011   Procedure: DILATATION AND CURETTAGE /HYSTEROSCOPY;  Surgeon: Allyn Kenner, DO;  Location: [facility];  Service: Gynecology;  Laterality: N/A;  . LAPAROSCOPY N/A 07/16/2013   Procedure: LAPAROSCOPY DIAGNOSTIC;  Surgeon: Allyn Kenner, DO;  Location: [facility];  Service: Gynecology;  Laterality: N/A;  . PLANTAR FASCIA SURGERY    . PUBOVAGINAL SLING N/A 07/16/2013   Procedure: Gaynelle Arabian;  Surgeon: Reece Packer, MD;  Location: [facility];  Service: Urology;  Laterality: N/A;  . TUBAL LIGATION    . [place],  2003    There were no vitals filed for this visit.  Subjective Assessment - 04/04/18 1412    Subjective  "So are you the one who could tell them I'm OK to return to work?"     Patient is accompained by:  Bridget Anderson, aide   Currently in Pain?  Yes    Pain Score  5     Pain Location  Head    Pain Orientation  Upper    Pain Descriptors / Indicators  Burning;Headache    Pain Type  Chronic pain    Pain Onset  More than a month ago    Pain Frequency  Constant            ADULT SLP TREATMENT - 04/04/18  [place]   Behavior/Cognition  Distractible;Cooperative;Decreased sustained attention      Treatment Provided   Treatment provided  Cognitive-Linquistic      Cognitive-Linquistic Treatment   Treatment focused on  Cognition    Skilled Treatment  SLP used example that occurred to work on awareness today. Pt did not recall the example so SLP had to remind her that it happened, about recall of "last door on the right." Pt wrote that SLP ould "help pt tell Dr. Naaman Anderson (she) could go back to work" when SLP told pt that SLP would provide into for Dr. Naaman Anderson to decide if she could return to work. SLP told aide that she may need to verify if pt wrote what was said as SLP stated "(her) desires seem to be infusing their way into what Bridget Anderson is writing down" at  times. SLP told Bridget Anderson she is an important part of Bridget Anderson's recovery as she will be the one to help Bridget Anderson recall correctly and reinforce correct memories. Pt asked SLP if there were meds to assist memory and attention and SLP told pt there were - she wrote down she wanted to ask the MD about these - her med list shows she is already prescribed meds for these areas. She then wrote down to ask about increasing the dosage. Pt was asked what she did in previous therapies and req'd cues from SLP for names and order of therapies today (OT, then PT - Bridget Anderson and Bridget Anderson). With pt writing during the session, pt educated aide that if pt is writing she needs to be quiet, and/or mute the TV - take away all distractions. SLP told pt what time she arrived tomorrow [phone]) and pt was unsure if she could get here at [facility] due to dropping her dtr off at school at 0800; SLP told pt to make it here asap after that - appro 2-3 mintues used discussing how early this was, don't worry if she's 5-7 minutes late, etc, etc. 2 minutes later SLP asked pt what time she was coming and pt did not recall.       Assessment / Recommendations / Plan   Plan  Continue with current plan of care      Progression Toward Goals   Progression toward goals  Progressing toward goals   pt actively writing the entire session      SLP Education - 04/04/18 1515    Education Details  severity of pt deficits, Dr. Naaman Anderson will not tell her she can return to work on Monday 04-09-18, how aide can assist pt    Person(s) Educated  Patient;Caregiver(s)    Methods  Explanation;Demonstration    Comprehension  Verbalized understanding;Need further instruction       SLP Short Term Goals - 04/04/18 1520      SLP SHORT TERM GOAL #1   Title  pt will demo use of memory enhancing system outside of ST session (i.e., writing down items/entries in a memory journal) between 8 sessions to enhance or provide a means of functional memory for pt    Time  3    Period   Weeks    Status  On-going      SLP SHORT TERM GOAL #2   Title  pt will tell SLP 3 practical ramifications of her cognitive deficits with modified independence (i.e., written notes) in 4 sessions    Time  3    Period  Weeks    Status  On-going      SLP SHORT TERM GOAL #3   Title  pt will give adequate synopsis of salient written material she enjoys with modified independence over two sessions    Time  3    Period  Weeks    Status  On-going       SLP Long Term Goals - 04/04/18 1520      SLP LONG TERM GOAL #1   Title  pt will demo appropriate usage of a memory enhancement/compensation system between 10 ST sessions    Time  9    Period  Weeks    Status  On-going       Plan - 04/04/18 1517    Clinical Impression Statement  Pt did not bring memory binder/notebook with her today. Pt presents with significant/profound memory deficits (memory encoding?) - pt demo'd sustained memory of approx 45-60 seconds, with pockets of sustained memory of 1-2 minutes. See skilled intervention for details about today's visit. Aide, Bridget Anderson, now attending with pt in hopes pt's environment and cueing as well as reminders to use compensations will be more consistent, this will positively impact pt's memory skills. Pt would benefit from skilled ST to develop a system to enhance her memory so that she can increase her independence and her quality of life. If pt does not appear to recall things more consistently from previous ST sessions in approx 3-4 more sessions, d/c may be appropriate.     Speech Therapy Frequency  2x / week    Duration  --   12 weeks/25 sessions   Treatment/Interventions  Compensatory strategies;Patient/family education;Functional tasks;Cueing hierarchy;Cognitive reorganization;Environmental controls;Internal/external aids;SLP instruction and feedback    Potential to Achieve Goals  Fair    Potential Considerations  Cooperation/participation level;Severity of impairments;Ability to  learn/carryover information       Patient will benefit from skilled therapeutic intervention in order to improve the following deficits and impairments:   Cognitive communication deficit    Problem List Patient Active Problem List   Diagnosis Date Noted  . Anterograde amnesia   . Postconcussive syndrome 01/01/2018  . Asthma   . Essential hypertension   . Diabetes mellitus type 2 in obese ([place])   . Dyslipidemia   . Vascular headache   . Chronic post-traumatic headache, not intractable 11/20/2017  . Post concussion syndrome 11/20/2017  . Shortness of breath on exertion 05/25/2017  . Other fatigue 05/25/2017  . Type 2 diabetes mellitus without complication, without long-term current use of insulin ([place]) 05/25/2017  . Uncontrolled hypertension 05/25/2017  . Chronic migraine without aura, with intractable migraine, so stated, with status migrainosus 03/20/2017  . S/P hysterectomy 07/16/2013    [facility] ,MS, CCC-SLP  04/04/2018, 3:21 PM  Bridget Anderson [address] [place], [place], [address] Phone: [phone]   Fax:  [phone]   Name: Bridget Anderson MRN: [id number] Date of Birth: [birth year]

## 2018-04-04 NOTE — Patient Instructions (Addendum)
Gaze Stabilization: Tip Card  1.Target must remain in focus, not blurry, and appear stationary while head is in motion. 2.Perform exercises with small head movements (30 to either side of midline). 3.Increase speed of head motion so long as target is in focus. 4.If you wear eyeglasses, be sure you can see target through lens (therapist will give specific instructions for bifocal / progressive lenses). 5.These exercises should provoke dizziness or nausea. Work through these symptoms. If too dizzy, slow head movement slightly. (Too dizzy is moderately dizzy or 5 out of 10 with a 10 meaning you're so dizzy you are falling).  Overall the symptoms of dizziness or nausea should resolve within 30 minutes of completing all repetitions. 6.Exercises demand concentration; avoid distractions. Begin with the target on a simple background unless instructed otherwise.      Special Instructions:  If symptoms are lasting longer than 30 minutes, modify your exercises by:               >decreasing the # of times you complete each activity >ensuring your symptoms return to baseline before moving onto the next exercise >dividing up exercises so you do not do them all in one session, but multiple short sessions throughout the day >doing them once a day until symptoms improve   Gaze Stabilization: Sitting    Keeping eyes on target on wall 6 to 8 feet away, and move head side to side for _30-60___ seconds. Rest and repeat side to side. Then repeat while moving head up and down for __30-60__ seconds. Rest and repeat up and down. (ie do each direction two times)  Do __2-3__ sessions per day.   Compensatory Strategies: Corrective Saccades    1. Holding two stationary targets placed __8-10__ inches apart, move eyes to target, keep head still. 2. Then move head in direction of target while eyes remain on target. 3/4. Repeat in opposite direction. Perform sitting. Repeat sequence _5___ times per  session. Do __2__ sessions per day.  Copyright  VHI. All rights reserved.    Access Code: [email or web address]  URL: [email or web address]  Date: 04/04/2018  Prepared by: Rudell Cobb   Exercises Romberg Stance with Head Rotation - 10 reps - 2 sets - 2x daily - 5x weekly Walking Tandem Stance - 10 reps - 3 sets - 1x daily - 7x weekly

## 2018-04-04 NOTE — Therapy (Signed)
[facility] [address] [place], [place], [address] Phone: [phone]   Fax:  [phone]  Occupational Therapy Renewal  Patient Details  Name: Bridget Anderson MRN: [id number] Date of Birth: 08/18/1970 No data recorded  Encounter Date: 04/04/2018  OT End of Session - 04/04/18 1436    Visit Number  1    Number of Visits  16    Date for OT Re-Evaluation  06/03/18    Authorization Type  [employer]? vs Workman's Comp?    OT Start Time  1230    OT Stop Time  1320    OT Time Calculation (min)  50 min    Activity Tolerance  Patient tolerated treatment well       Past Medical History:  Diagnosis Date  . Chlamydia   . Diabetes ([place])   . GERD (gastroesophageal reflux disease)   . Hyperlipidemia   . Hypertension   . Migraine   . Obstructive sleep apnea on CPAP    pt has not worn CPAP in years  . Seasonal allergies   . Wears glasses     Past Surgical History:  Procedure Laterality Date  . ABDOMINAL HYSTERECTOMY N/A 07/16/2013   Procedure: HYSTERECTOMY ABDOMINAL;  Surgeon: Allyn Kenner, DO;  Location: [facility];  Service: Gynecology;  Laterality: N/A;  . BILATERAL SALPINGECTOMY Bilateral 07/16/2013   Procedure: BILATERAL SALPINGECTOMY;  Surgeon: Allyn Kenner, DO;  Location: [facility];  Service: Gynecology;  Laterality: Bilateral;  . CARPAL TUNNEL RELEASE  01/16/2012   Procedure: CARPAL TUNNEL RELEASE;  Surgeon: Nita Sells, MD;  Location: [facility];  Service: Orthopedics;  Laterality: Left;  . CARPAL TUNNEL RELEASE  02/20/2012   Procedure: CARPAL TUNNEL RELEASE;  Surgeon: Nita Sells, MD;  Location: [facility];  Service: Orthopedics;  Laterality: Right;  . COLONOSCOPY    . CYSTO WITH HYDRODISTENSION N/A 07/16/2013   Procedure: CYSTOSCOPY/HYDRODISTENSION;  Surgeon: Reece Packer, MD;  Location: [facility];  Service: Urology;  Laterality: N/A;  . DILATION AND CURETTAGE OF  UTERUS    . FOOT SURGERY    . HYSTEROSCOPY W/D&C  10/31/2011   Procedure: DILATATION AND CURETTAGE /HYSTEROSCOPY;  Surgeon: Allyn Kenner, DO;  Location: [facility];  Service: Gynecology;  Laterality: N/A;  . LAPAROSCOPY N/A 07/16/2013   Procedure: LAPAROSCOPY DIAGNOSTIC;  Surgeon: Allyn Kenner, DO;  Location: [facility];  Service: Gynecology;  Laterality: N/A;  . PLANTAR FASCIA SURGERY    . PUBOVAGINAL SLING N/A 07/16/2013   Procedure: Gaynelle Arabian;  Surgeon: Reece Packer, MD;  Location: [facility];  Service: Urology;  Laterality: N/A;  . TUBAL LIGATION    . [place],  2003    There were no vitals filed for this visit.  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 PMH: HTN, DM, migraines    Limitations  decreased memory and awareness into deficits    Currently in Pain?  Yes    Pain Score  6     Pain Location  Head    Pain Descriptors / Indicators  Headache    Pain Type  Chronic pain    Pain Onset  More than a month ago         [facility] OT Assessment - 04/04/18 0001      Home  Environment   Additional Comments  Pt has aide Tues-Fri for most of the day (8:30-5:00). Pt's sister, brother, and parents live close by. Lives on main level, but laundry in basement.     Lives With  Daughter   29 y.o. daughter     ADL   ADL comments  Pt mod I for all BADLS, except aide helps w/ medication management, monitors HTN and blood sugars. Pt's aide reports she has 24 hr. supervision b/t aide, daughter, and family (mainly sister)      IADL   Shopping  Needs to be accompanied on any shopping trip    Light Housekeeping  Performs light daily tasks such as dishwashing, bed making   does not perform laundry   Meal Prep  --   Aide or daughter assist due to cognitive deficits   Community Mobility  --   pt has driven short distances to take dtr to school in am    Medication Management  Is not capable of dispensing or managing own medication;Has difficulty remembering to take medication    Financial Management  --   sister performing     Cognition   Overall Cognitive Status  Impaired/Different from baseline    Attention  Selective;Alternating;Divided    Selective Attention  Impaired    Alternating Attention  Impaired    Divided Attention  Impaired    Memory  Impaired    Memory Impairment  Decreased recall of new information;Decreased short term memory;Prospective memory    Awareness  Impaired    Awareness Impairment  Emergent impairment      Coordination   9 Hole Peg Test  Right;Left    Right 9 Hole Peg Test  31 sec    Left 9 Hole Peg Test  31 sec      ROM / Strength   AROM / PROM / Strength  AROM;Strength      AROM   Overall AROM Comments  BUE AROM WNL'S w/ soreness Lt upper lateral arm       Strength   Overall Strength Comments  LUE MMT grossly 3+/5      Hand Function   Right Hand Grip (lbs)  62 lbs    Left Hand Grip (lbs)  25 lbs               OT Treatments/Exercises (OP) - 04/04/18 0001      ADLs   ADL Comments  Pt has not been seen by O.T. since initial evaluation on 01/25/18, therefore completed reassessment today and slightly revised POC prn. Discussed POC w/ pt and aide.                OT Short Term Goals - 04/04/18 1438      OT SHORT TERM GOAL #1   Title  Independent with HEP for LUE strength and grip strength - due 05/03/18    Time  4    Period  Weeks    Status  New      OT SHORT TERM GOAL #2   Title  Pt to verbalize understanding with memory strategies for functional tasks     Time  4    Period  Weeks    Status  New      OT SHORT TERM GOAL #3   Title  Pt to report dizziness less than 5/10 with functional tasks (loading/unloading dishwasher, laundry tasks, etc)     Time  4    Period  Weeks    Status  New      OT SHORT TERM GOAL #4   Title  Pt to perform simple familiar cooking task w/ no  more than min cueing    Time  4    Period  Weeks    Status  New      OT SHORT TERM GOAL #5   Title  Pt to attend to cognitive task for 10 min. in quiet environment in prep for meal planning/money management    Time  4    Period  Weeks    Status  New        OT Long Term Goals - 04/04/18 1440      OT LONG TERM GOAL #1   Title  Pt to improve grip strength Lt hand to 35 lbs or greater - due 06/03/18    Baseline  eval = 18 lbs, reeval on 04/04/18 = 25 lbs    Time  8    Period  Weeks    Status  Revised      OT LONG TERM GOAL #2   Title  Pt to perform simple novel cooking task with min cues only    Time  8    Period  Weeks    Status  New      OT LONG TERM GOAL #3   Title  Pt to attend to simple cognitve task (meal planning, basic money management) in moderately distracting enviornment for 15 min. w/o rest    Time  8    Period  Weeks    Status  New      OT LONG TERM GOAL #4   Title  Pt to demo sufficient working memory for simple organizational/problem solving task    Time  8    Period  Weeks    Status  New      OT LONG TERM GOAL #5   Title  Pt to perform environmental scanning in busy environment w/ 90% accuracy    Time  8    Period  Weeks    Status  New            Plan - 04/04/18 1448    Clinical Impression Statement  Pt returns to O.T. today after not being seen in over 2 months from initial evaluation on 01/25/2018. Renewal completed today - see reassessment. 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, HTN, DM, migraines    Occupational performance deficits (Please refer to evaluation for details):  IADL's;Work;Leisure;Social Participation    Rehab Potential  Fair    OT Frequency  2x / week    OT Duration  8 weeks    OT Treatment/Interventions  Self-care/ADL training;Therapeutic exercise;Visual/perceptual remediation/compensation;Coping  strategies training;Patient/family education;Therapist, nutritional;Therapeutic activities;DME and/or AE instruction;Cognitive remediation/compensation    Plan  LUE strengthening HEP (Low range theraband), putty HEP Lt hand, issue BI support group info    Clinical Decision Making  Limited treatment options, no task modification necessary    Consulted and Agree with Plan of Care  Patient;Other (Comment)   Aide      Patient will benefit from skilled therapeutic intervention in order to improve the following deficits and impairments:  Decreased cognition, Impaired vision/preception, Pain, Decreased endurance, Decreased activity tolerance, Decreased strength, Decreased coping skills, Decreased knowledge of precautions, Decreased safety awareness, Impaired perceived functional ability, Impaired UE functional use  Visit Diagnosis: Muscle weakness (generalized) - Plan: Ot  plan of care cert/re-cert  Visuospatial deficit - Plan: Ot plan of care cert/re-cert  Attention and concentration deficit - Plan: Ot plan of care cert/re-cert  Frontal lobe and executive function deficit - Plan: Ot plan of care cert/re-cert    Problem List Patient Active Problem List   Diagnosis Date Noted  . Anterograde amnesia   . Postconcussive syndrome 01/01/2018  . Asthma   . Essential hypertension   . Diabetes mellitus type 2 in obese ([place])   . Dyslipidemia   . Vascular headache   . Chronic post-traumatic headache, not intractable 11/20/2017  . Post concussion syndrome 11/20/2017  . Shortness of breath on exertion 05/25/2017  . Other fatigue 05/25/2017  . Type 2 diabetes mellitus without complication, without long-term current use of insulin ([place]) 05/25/2017  . Uncontrolled hypertension 05/25/2017  . Chronic migraine without aura, with intractable migraine, so stated, with status migrainosus 03/20/2017  . S/P hysterectomy 07/16/2013    Carey Bullocks, OTR/L 04/04/2018, 2:55 PM  [facility] [address] [place], [place], [address] Phone: [phone]   Fax:  [phone]  Name: Bridget Anderson MRN: [id number] Date of Birth: 15-Dec-1970

## 2018-04-04 NOTE — Patient Instructions (Signed)
  Please complete the assigned speech therapy homework prior to your next session and return it to the speech therapist at your next visit.   (write in your notebook tonight and tomorrow morning, and BRING NOTEBOOK)

## 2018-04-05 ENCOUNTER — Ambulatory Visit: Payer: No Typology Code available for payment source | Admitting: Physical Therapy

## 2018-04-05 ENCOUNTER — Ambulatory Visit: Payer: No Typology Code available for payment source | Admitting: Occupational Therapy

## 2018-04-05 ENCOUNTER — Ambulatory Visit: Payer: No Typology Code available for payment source

## 2018-04-05 ENCOUNTER — Encounter: Payer: Self-pay | Admitting: Physical Therapy

## 2018-04-05 VITALS — BP 152/90 | HR 50

## 2018-04-05 DIAGNOSIS — M6281 Muscle weakness (generalized): Secondary | ICD-10-CM

## 2018-04-05 DIAGNOSIS — R2681 Unsteadiness on feet: Secondary | ICD-10-CM

## 2018-04-05 DIAGNOSIS — R42 Dizziness and giddiness: Secondary | ICD-10-CM

## 2018-04-05 DIAGNOSIS — R41841 Cognitive communication deficit: Secondary | ICD-10-CM

## 2018-04-05 DIAGNOSIS — R2689 Other abnormalities of gait and mobility: Secondary | ICD-10-CM

## 2018-04-05 NOTE — Patient Instructions (Signed)
   Strengthening: Resisted Flexion   Hold tubing with _Lt____ arm(s) at side. Pull forward and up. Move shoulder through pain-free range of motion. Repeat __10__ times per set.  Do _1-2_ sessions per day , every other day     Strengthening: Resisted Extension   Hold tubing in __Lt___ hand(s), arm forward. Pull arm back, elbow straight. Repeat _10___ times per set. Do _1-2___ sessions per day, every other day.     Resisted Horizontal Abduction: Bilateral   Sit or stand, tubing in both hands, arms out in front. Keeping arms straight, pinch shoulder blades together and stretch arms out. Repeat _10___ times per set. Do _1-2___ sessions per day, every other day.     1. Grip Strengthening (Resistive Putty)   Squeeze putty using thumb and all fingers. Repeat _20___ times. Do __2__ sessions per day.   2. Roll putty into tube on table and pinch between first 2 fingers and thumb x 10 reps. Do 2 sessions per day.      Copyright  VHI. All rights reserved.

## 2018-04-05 NOTE — Therapy (Signed)
[facility] [address] [place], [place], [address] Phone: [phone]   Fax:  [phone]  Occupational Therapy Treatment  Patient Details  Name: Bridget Anderson MRN: [id number] Date of Birth: 08-Dec-1970 No data recorded  Encounter Date: 04/05/2018  OT End of Session - 04/05/18 1149    Visit Number  2    Number of Visits  16    Date for OT Re-Evaluation  06/03/18    Authorization Type  [employer]? vs Workman's Comp?    OT Start Time  1105    OT Stop Time  1145    OT Time Calculation (min)  40 min    Activity Tolerance  Patient tolerated treatment well       Past Medical History:  Diagnosis Date  . Chlamydia   . Diabetes ([place])   . GERD (gastroesophageal reflux disease)   . Hyperlipidemia   . Hypertension   . Migraine   . Obstructive sleep apnea on CPAP    pt has not worn CPAP in years  . Seasonal allergies   . Wears glasses     Past Surgical History:  Procedure Laterality Date  . ABDOMINAL HYSTERECTOMY N/A 07/16/2013   Procedure: HYSTERECTOMY ABDOMINAL;  Surgeon: Allyn Kenner, DO;  Location: [facility];  Service: Gynecology;  Laterality: N/A;  . BILATERAL SALPINGECTOMY Bilateral 07/16/2013   Procedure: BILATERAL SALPINGECTOMY;  Surgeon: Allyn Kenner, DO;  Location: [facility];  Service: Gynecology;  Laterality: Bilateral;  . CARPAL TUNNEL RELEASE  01/16/2012   Procedure: CARPAL TUNNEL RELEASE;  Surgeon: Nita Sells, MD;  Location: [facility];  Service: Orthopedics;  Laterality: Left;  . CARPAL TUNNEL RELEASE  02/20/2012   Procedure: CARPAL TUNNEL RELEASE;  Surgeon: Nita Sells, MD;  Location: [facility];  Service: Orthopedics;  Laterality: Right;  . COLONOSCOPY    . CYSTO WITH HYDRODISTENSION N/A 07/16/2013   Procedure: CYSTOSCOPY/HYDRODISTENSION;  Surgeon: Reece Packer, MD;  Location: [facility];  Service: Urology;  Laterality: N/A;  . DILATION AND CURETTAGE OF  UTERUS    . FOOT SURGERY    . HYSTEROSCOPY W/D&C  10/31/2011   Procedure: DILATATION AND CURETTAGE /HYSTEROSCOPY;  Surgeon: Allyn Kenner, DO;  Location: [facility];  Service: Gynecology;  Laterality: N/A;  . LAPAROSCOPY N/A 07/16/2013   Procedure: LAPAROSCOPY DIAGNOSTIC;  Surgeon: Allyn Kenner, DO;  Location: [facility];  Service: Gynecology;  Laterality: N/A;  . PLANTAR FASCIA SURGERY    . PUBOVAGINAL SLING N/A 07/16/2013   Procedure: Gaynelle Arabian;  Surgeon: Reece Packer, MD;  Location: [facility];  Service: Urology;  Laterality: N/A;  . TUBAL LIGATION    . [place],  2003    There were no vitals filed for this visit.  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 PMH: HTN, DM, migraines    Limitations  decreased memory and awareness into deficits    Currently in Pain?  Yes    Pain Score  4     Pain Location  Arm    Pain Orientation  Left    Pain Descriptors / Indicators  Aching    Pain Onset  More than a month ago    Pain Frequency  Constant    Aggravating Factors   worse at night    Pain Relieving Factors  resting position       Pt issued putty and  theraband HEP for LUE to address weakness. Pt demo each w/ reps as indicated, with min cueing. Pt's aide present for education to carryover at home d/t memory deficits. See pt instructions for details Pt also reports constant ache/soreness lateral upper arm, elbow joint, and proximal forearm LUE. Pt point tender at lateral epicondyle attachment and pain with wrist extension. Suspect some mild lateral epicondylitis, however this would not explain pain at elbow joint (dorsally) and upper arm pain. Kinesiotaped to relax middle deltoid and wrist extensors. Educated Patent examiner on wear and care                    OT Education - 04/05/18 1148    Education Details  putty HEP, Theraband HEP, kinesiotape wear and care    Person(s) Educated   Patient;Caregiver(s)    Methods  Explanation;Demonstration;Handout;Verbal cues    Comprehension  Verbalized understanding;Returned demonstration       OT Short Term Goals - 04/05/18 1150      OT SHORT TERM GOAL #1   Title  Independent with HEP for LUE strength and grip strength - due 05/03/18    Time  4    Period  Weeks    Status  On-going      OT SHORT TERM GOAL #2   Title  Pt to verbalize understanding with memory strategies for functional tasks     Time  4    Period  Weeks    Status  New      OT SHORT TERM GOAL #3   Title  Pt to report dizziness less than 5/10 with functional tasks (loading/unloading dishwasher, laundry tasks, etc)     Time  4    Period  Weeks    Status  New      OT SHORT TERM GOAL #4   Title  Pt to perform simple familiar cooking task w/ no more than min cueing    Time  4    Period  Weeks    Status  New      OT SHORT TERM GOAL #5   Title  Pt to attend to cognitive task for 10 min. in quiet environment in prep for meal planning/money management    Time  4    Period  Weeks    Status  New        OT Long Term Goals - 04/04/18 1440      OT LONG TERM GOAL #1   Title  Pt to improve grip strength Lt hand to 35 lbs or greater - due 06/03/18    Baseline  eval = 18 lbs, reeval on 04/04/18 = 25 lbs    Time  8    Period  Weeks    Status  Revised      OT LONG TERM GOAL #2   Title  Pt to perform simple novel cooking task with min cues only    Time  8    Period  Weeks    Status  New      OT LONG TERM GOAL #3   Title  Pt to attend to simple cognitve task (meal planning, basic money management) in moderately distracting enviornment for 15 min. w/o rest    Time  8    Period  Weeks    Status  New      OT LONG TERM GOAL #4   Title  Pt to demo sufficient working memory for simple organizational/problem solving task    Time  8  Period  Weeks    Status  New      OT LONG TERM GOAL #5   Title  Pt to perform environmental scanning in busy environment w/  90% accuracy    Time  8    Period  Weeks    Status  New            Plan - 04/05/18 1150    Clinical Impression Statement  Pt tolerating theraband and putty HEP well for LUE. Pt noted to be point tender at wrist extensor tendon attatchment at lateral epicondyle and more pain w/ wrist ext - ? 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, HTN, DM, migraines    Occupational performance deficits (Please refer to evaluation for details):  IADL's;Work;Leisure;Social Participation    Rehab Potential  Fair    OT Frequency  2x / week    OT Duration  8 weeks    OT Treatment/Interventions  Self-care/ADL training;Therapeutic exercise;Visual/perceptual remediation/compensation;Coping strategies training;Patient/family education;Therapist, nutritional;Therapeutic activities;DME and/or AE instruction;Cognitive remediation/compensation    Plan  review HEP prn, assess taping, issue BI support group info, progress towards goals    Consulted and Agree with Plan of Care  Patient;Other (Comment)   aide      Patient will benefit from skilled therapeutic intervention in order to improve the following deficits and impairments:  Decreased cognition, Impaired vision/preception, Pain, Decreased endurance, Decreased activity tolerance, Decreased strength, Decreased coping skills, Decreased knowledge of precautions, Decreased safety awareness, Impaired perceived functional ability, Impaired UE functional use  Visit Diagnosis: Muscle weakness (generalized)    Problem List Patient Active Problem List   Diagnosis Date Noted  . Anterograde amnesia   . Postconcussive syndrome 01/01/2018  . Asthma   . Essential hypertension   . Diabetes mellitus type 2 in obese ([place])   . Dyslipidemia   . Vascular headache   . Chronic post-traumatic headache, not intractable 11/20/2017  . Post concussion syndrome 11/20/2017  . Shortness of  breath on exertion 05/25/2017  . Other fatigue 05/25/2017  . Type 2 diabetes mellitus without complication, without long-term current use of insulin ([place]) 05/25/2017  . Uncontrolled hypertension 05/25/2017  . Chronic migraine without aura, with intractable migraine, so stated, with status migrainosus 03/20/2017  . S/P hysterectomy 07/16/2013    Carey Bullocks, OTR/L 04/05/2018, 11:58 AM  [facility] [address] [place], [place], [address] Phone: [phone]   Fax:  [phone]  Name: KHADIJATOU BORAK MRN: [id number] Date of Birth: 1971/01/10

## 2018-04-05 NOTE — Therapy (Addendum)
[facility] [address] [place], [place], [address] Phone: [phone]   Fax:  [phone]  Speech Language Pathology Treatment  Patient Details  Name: Bridget Anderson MRN: [id number] Date of Birth: Sep 12, 1970 Referring Provider (SLP): Alger Simons, MD   Encounter Date: 04/05/2018    End of Session - 04/05/18    Visit Number  6   Number of Visits  25    Date for SLP Re-Evaluation  04/19/18    Authorization - Number of Visits  25    SLP Start Time  0850   SLP Stop Time   0930    SLP Time Calculation (min)  40 min    Activity Tolerance  Patient tolerated treatment well      Past Medical History:  Diagnosis Date  . Chlamydia   . Diabetes ([place])   . GERD (gastroesophageal reflux disease)   . Hyperlipidemia   . Hypertension   . Migraine   . Obstructive sleep apnea on CPAP    pt has not worn CPAP in years  . Seasonal allergies   . Wears glasses     Past Surgical History:  Procedure Laterality Date  . ABDOMINAL HYSTERECTOMY N/A 07/16/2013   Procedure: HYSTERECTOMY ABDOMINAL;  Surgeon: Allyn Kenner, DO;  Location: [facility];  Service: Gynecology;  Laterality: N/A;  . BILATERAL SALPINGECTOMY Bilateral 07/16/2013   Procedure: BILATERAL SALPINGECTOMY;  Surgeon: Allyn Kenner, DO;  Location: [facility];  Service: Gynecology;  Laterality: Bilateral;  . CARPAL TUNNEL RELEASE  01/16/2012   Procedure: CARPAL TUNNEL RELEASE;  Surgeon: Nita Sells, MD;  Location: [facility];  Service: Orthopedics;  Laterality: Left;  . CARPAL TUNNEL RELEASE  02/20/2012   Procedure: CARPAL TUNNEL RELEASE;  Surgeon: Nita Sells, MD;  Location: [facility];  Service: Orthopedics;  Laterality: Right;  . COLONOSCOPY    . CYSTO WITH HYDRODISTENSION N/A 07/16/2013   Procedure: CYSTOSCOPY/HYDRODISTENSION;  Surgeon: Reece Packer, MD;  Location: [facility];  Service: Urology;  Laterality:  N/A;  . DILATION AND CURETTAGE OF UTERUS    . FOOT SURGERY    . HYSTEROSCOPY W/D&C  10/31/2011   Procedure: DILATATION AND CURETTAGE /HYSTEROSCOPY;  Surgeon: Allyn Kenner, DO;  Location: [facility];  Service: Gynecology;  Laterality: N/A;  . LAPAROSCOPY N/A 07/16/2013   Procedure: LAPAROSCOPY DIAGNOSTIC;  Surgeon: Allyn Kenner, DO;  Location: [facility];  Service: Gynecology;  Laterality: N/A;  . PLANTAR FASCIA SURGERY    . PUBOVAGINAL SLING N/A 07/16/2013   Procedure: Gaynelle Arabian;  Surgeon: Reece Packer, MD;  Location: [facility];  Service: Urology;  Laterality: N/A;  . TUBAL LIGATION    . [place],  2003    There were no vitals filed for this visit.  Subjective Assessment - 04/05/18 0851    Subjective  Pt with notebook today. Looking at notes she took yesterday. "I guess this is it." "So you're [name]?"    Patient is accompained by:  Charlena Cross, aid           ADULT SLP TREATMENT - 04/05/18 0853      General Information   Behavior/Cognition  Distractible;Cooperative;Decreased sustained attention      Treatment Provided   Treatment provided  Cognitive-Linquistic      Cognitive-Linquistic Treatment   Treatment focused on  Cognition    Skilled Treatment  Pt brought notebook and told SLP one thing that occurred last night (only thing for yesterday other  than therapy). Pt using her notes from yesterday's ST to recap what we talked about yesterday. "You told me to ask Dr. Albertina Senegal? about some medicine for memory so that I could go back to work." "I guess I did write the stuff (from yesterday) in my book." Pt reading notes from yesterday like they are the first time she's seen them. SLP and pt worked on orientation to [employer] - appointments section. Pt navigated to tihs without cues and found who she sees with min A initially. However when Mychart didn't show appointments, pt unsure where to look. Pt was told to tell next therapist something that occurred during her  session with ST. Pt req'd total A how to do this - her idea for remembering was not functional for pt's level of need. Pt recalled to do so with alarm in her phone and then notes on what occurred in [place].      Assessment / Recommendations / Plan   Plan  Continue with current plan of care      Progression Toward Goals   Progression toward goals  Progressing toward goals         SLP Short Term Goals - 04/05/18 1256      SLP SHORT TERM GOAL #1   Title  pt will demo use of memory enhancing system outside of ST session (i.e., writing down items/entries in a memory journal) between 8 sessions to enhance or provide a means of functional memory for pt    Time  3    Period  Weeks    Status  On-going      SLP SHORT TERM GOAL #2   Title  pt will tell SLP 3 practical ramifications of her cognitive deficits with modified independence (i.e., written notes) in 4 sessions    Time  3    Period  Weeks    Status  On-going      SLP SHORT TERM GOAL #3   Title  pt will give adequate synopsis of salient written material she enjoys with modified independence over two sessions    Time  3    Period  Weeks    Status  On-going       SLP Long Term Goals - 04/05/18 1257      SLP LONG TERM GOAL #1   Title  pt will demo appropriate usage of a memory enhancement/compensation system between 10 ST sessions    Time  9    Period  Weeks    Status  On-going       Plan - 04/05/18 1256    Clinical Impression Statement   Pt presents with significant/profound memory deficits (memory encoding?) - pt demo'd sustained memory of approx 45-60 seconds, with pockets of sustained memory of 1-2 minutes. See skilled intervention for details about today's visit. Aide, Charlena Cross, now attending with pt in hopes pt's environment and cueing as well as reminders to use compensations will be more consistent, this will positively impact pt's memory skills. Pt would benefit from skilled ST to develop a system to enhance her memory so that  she can increase her independence and her quality of life. If pt does not appear to recall things more consistently from previous ST sessions in approx 3-4 more sessions, d/c may be appropriate.     Speech Therapy Frequency  2x / week    Duration  --   12 weeks/25 sessions   Treatment/Interventions  Compensatory strategies;Patient/family education;Functional tasks;Cueing hierarchy;Cognitive reorganization;Environmental controls;Internal/external aids;SLP instruction and feedback  Potential to Achieve Goals  Fair    Potential Considerations  Cooperation/participation level;Severity of impairments;Ability to learn/carryover information       Patient will benefit from skilled therapeutic intervention in order to improve the following deficits and impairments:   Cognitive communication deficit    Problem List Patient Active Problem List   Diagnosis Date Noted  . Anterograde amnesia   . Postconcussive syndrome 01/01/2018  . Asthma   . Essential hypertension   . Diabetes mellitus type 2 in obese ([place])   . Dyslipidemia   . Vascular headache   . Chronic post-traumatic headache, not intractable 11/20/2017  . Post concussion syndrome 11/20/2017  . Shortness of breath on exertion 05/25/2017  . Other fatigue 05/25/2017  . Type 2 diabetes mellitus without complication, without long-term current use of insulin ([place]) 05/25/2017  . Uncontrolled hypertension 05/25/2017  . Chronic migraine without aura, with intractable migraine, so stated, with status migrainosus 03/20/2017  . S/P hysterectomy 07/16/2013    [facility] ,MS, CCC-SLP  04/05/2018, 12:58 PM  [facility] [address] [place], [place], [address] Phone: [phone]   Fax:  [phone]   Name: Bridget Anderson MRN: [id number] Date of Birth: [birth year]

## 2018-04-05 NOTE — Therapy (Signed)
[facility] [address] [place], [place], [address] Phone: [phone]   Fax:  [phone]  Physical Therapy Treatment  Patient Details  Name: Bridget Anderson MRN: [id number] Date of Birth: 11-14-1970 Referring Provider (PT): Dr. Oval Linsey   Encounter Date: 04/05/2018  PT End of Session - 04/05/18 1325    Visit Number  7    Number of Visits  13    Date for PT Re-Evaluation  04/06/18    Authorization Type  Workers Comp    PT Start Time  0935    PT Stop Time  1020    PT Time Calculation (min)  45 min    Activity Tolerance  Patient tolerated treatment well    Behavior During Therapy  --   unable to remember items during therapy      Past Medical History:  Diagnosis Date  . Chlamydia   . Diabetes ([place])   . GERD (gastroesophageal reflux disease)   . Hyperlipidemia   . Hypertension   . Migraine   . Obstructive sleep apnea on CPAP    pt has not worn CPAP in years  . Seasonal allergies   . Wears glasses     Past Surgical History:  Procedure Laterality Date  . ABDOMINAL HYSTERECTOMY N/A 07/16/2013   Procedure: HYSTERECTOMY ABDOMINAL;  Surgeon: Allyn Kenner, DO;  Location: [facility];  Service: Gynecology;  Laterality: N/A;  . BILATERAL SALPINGECTOMY Bilateral 07/16/2013   Procedure: BILATERAL SALPINGECTOMY;  Surgeon: Allyn Kenner, DO;  Location: [facility];  Service: Gynecology;  Laterality: Bilateral;  . CARPAL TUNNEL RELEASE  01/16/2012   Procedure: CARPAL TUNNEL RELEASE;  Surgeon: Nita Sells, MD;  Location: [facility];  Service: Orthopedics;  Laterality: Left;  . CARPAL TUNNEL RELEASE  02/20/2012   Procedure: CARPAL TUNNEL RELEASE;  Surgeon: Nita Sells, MD;  Location: [facility];  Service: Orthopedics;  Laterality: Right;  . COLONOSCOPY    . CYSTO WITH HYDRODISTENSION N/A 07/16/2013   Procedure: CYSTOSCOPY/HYDRODISTENSION;  Surgeon: Reece Packer, MD;   Location: [facility];  Service: Urology;  Laterality: N/A;  . DILATION AND CURETTAGE OF UTERUS    . FOOT SURGERY    . HYSTEROSCOPY W/D&C  10/31/2011   Procedure: DILATATION AND CURETTAGE /HYSTEROSCOPY;  Surgeon: Allyn Kenner, DO;  Location: [facility];  Service: Gynecology;  Laterality: N/A;  . LAPAROSCOPY N/A 07/16/2013   Procedure: LAPAROSCOPY DIAGNOSTIC;  Surgeon: Allyn Kenner, DO;  Location: [facility];  Service: Gynecology;  Laterality: N/A;  . PLANTAR FASCIA SURGERY    . PUBOVAGINAL SLING N/A 07/16/2013   Procedure: Gaynelle Arabian;  Surgeon: Reece Packer, MD;  Location: [facility];  Service: Urology;  Laterality: N/A;  . TUBAL LIGATION    . [place],  2003    Vitals:   04/05/18 0946  BP: (!) 152/90  Pulse: (!) 50    Subjective Assessment - 04/05/18 0939    Subjective  Pt continues to have HA every day but it isn't too bad today.  Was able to use notebook and tell therapist joke about speech therapist.     Pertinent History  Pt hit by 85# pipe at work June 2019, post-concussive syndrome,memory deficits    Patient Stated Goals  Pt's goal is to stop hurting (02/05/18) (regarding headaches)    Currently in Pain?  Yes    Pain Onset  More than a month ago         [facility] PT  Assessment - 04/05/18 0952      Assessment   Medical Diagnosis  TBI    Referring Provider (PT)  Dr. 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#    Leisure  shopping, being with family      Cognition   Overall Cognitive Status  Impaired/Different from baseline      Standardized Balance Assessment   Standardized Balance Assessment  10 meter walk test;Dynamic Gait Index;Timed Up and Go  Test    10 Meter Walk  10.91 seconds without AD; 3.00 ft/sec       Dynamic Gait Index   Level Surface  Mild Impairment    Change in Gait Speed  Mild Impairment    Gait with Horizontal Head Turns  Moderate Impairment    Gait with Vertical Head Turns  Moderate Impairment    Gait and Pivot Turn  Mild Impairment    Step Over Obstacle  Mild Impairment    Step Around Obstacles  Mild Impairment    Steps  Mild Impairment    Total Score  14    DGI comment:  14/24 still at increased falls risk      Timed Up and Go Test   TUG  Normal TUG;Manual TUG;Cognitive TUG    Normal TUG (seconds)  14.43    Manual TUG (seconds)  16.28    Cognitive TUG (seconds)  21.9    TUG Comments  51% difference between TUG and COG TUG                           PT Education - 04/05/18 1321    Education Details  goals assessed, results of outcome measures, areas to continue to address with therapy and recommendation that pt would not be safe to return to work at this time    [employer]) Educated  Patient;Caregiver(s)    Methods  Explanation    Comprehension  Verbalized understanding          04/05/18 0947  PT LONG TERM GOAL #1  Title patient to be independent with advanced HEP to address vestibular system and balance.  (Goals delayed to 2/14 due to multiple weeks of missed visits)  Period Weeks  Status Not Met  PT LONG TERM GOAL #2  Title patient to report reduction in headache frequency, intensity, duration by >/= 50%, for improved participation in ADLs.  Baseline Pt unable to report any change (due to memory) but she is able to tolerate therapy more despite lights and noise.  Time 6  Period Weeks  Status Unable to assess  PT LONG TERM GOAL #3  Title patient to improve DGI to at least 19/24 for demonstrating reduced fall risk  Baseline same as before 14/24 - gait velocity remained the same 3.0 ft/sec  Time 6  Period Weeks  Status Not Met  PT LONG TERM GOAL #4  Title Pt will ambulate  at least 1000 ft, indoors and outdoors, independently, no LOB, for return to community gait activities.  Baseline unable to assess due to weather  Time  6  Period Weeks  Status Unable to assess     New goals for recertification:   PT Short Term Goals - 04/05/18 1334      PT SHORT TERM GOAL #1   Title  = LTG        PT Long Term Goals - 04/05/18 1334      PT LONG TERM GOAL #1   Title  Pt will demonstrate compliance with vestibular and balance HEP with supervision and guidance from family or aide    Time  4    Period  Weeks    Status  New    Target Date  05/05/18      PT LONG TERM GOAL #2   Title  Pt will consistently report HA </= 4/10 during therapy and while performing HEP    Time  4    Period  Weeks    Status  New    Target Date  05/05/18      PT LONG TERM GOAL #3   Title  patient to improve DGI to at least 19/24 for demonstrating reduced fall risk    Baseline  same as before 14/24     Time  4    Period  Weeks    Status  New    Target Date  05/05/18      PT LONG TERM GOAL #4   Title  Pt will improve gait velocity to >/= 3.6 ft/sec without AD    Baseline  3.0 ft/sec no AD    Time  4    Period  Weeks    Status  New    Target Date  05/05/18      PT LONG TERM GOAL #5   Title  Pt will demonstrate </= 25% difference between TUG and COG TUG    Baseline  50% difference    Time  4    Period  Weeks    Status  New    Target Date  05/05/18            Plan - 04/05/18 1325    Clinical Impression Statement  Treatment session focused on assessment of progress towards LTG.  Pt continues to make slow progress over the past month but has not declined.  Pt has met 0/4 LTG but two goals unable to be assessed today either due to weather (gait outside) or due to patient's memory impairment (self reporting improvement in HA symptoms).  Pt's gait velocity and DGI scores have remained the same as 4 weeks ago.  Barriers to progress include pt's ability to remember to perform  HEP consistently; pt has aide with her a few days a week and the aide has been educated on performing exercises with patient but not enough time has passed to determine if consistent performance with aide will result in improvement of impairments.  Pt continues to present with dizziness, headache that is worse with more dynamic head movements, impaired balance, impaired gait and impaired dual tasking.  Goals updated; recommending pt continue with PT for 4 more weeks to continue to address these impairments, to decrease falls risk and ensure compliance with HEP.      Clinical Impairments Affecting Rehab Potential  Severity of (cognitive) deficits following injury; family involvement (sister working, has possible hired caregiver for home)    PT Frequency  2x / week    PT Duration  4 weeks    PT Treatment/Interventions  ADLs/Self Care Home Management;Gait training;Functional mobility training;Therapeutic activities;Therapeutic exercise;Patient/family education;Neuromuscular  re-education;Balance training;Canalith Repostioning;Visual/perceptual remediation/compensation;Vestibular;Stair training;Cognitive remediation    PT Next Visit Plan  continue to review and update HEP.  Simple dual task activities during gait.  Standing balance and gait with head turns to patient tolerance.  Obstacle negotiation and safety in community.  Gait outside.     PT Home Exercise Plan  [id number]    Consulted and Agree with Plan of Care  Patient;Family member/caregiver    Family Member [employer] - aide       Patient will benefit from skilled therapeutic intervention in order to improve the following deficits and impairments:  Abnormal gait, Decreased balance, Decreased safety awareness, Decreased mobility, Difficulty walking, Postural dysfunction, Dizziness, Decreased activity tolerance  Visit Diagnosis: Dizziness and giddiness  Unsteadiness on feet  Other abnormalities of gait and mobility     Problem  List Patient Active Problem List   Diagnosis Date Noted  . Anterograde amnesia   . Postconcussive syndrome 01/01/2018  . Asthma   . Essential hypertension   . Diabetes mellitus type 2 in obese ([place])   . Dyslipidemia   . Vascular headache   . Chronic post-traumatic headache, not intractable 11/20/2017  . Post concussion syndrome 11/20/2017  . Shortness of breath on exertion 05/25/2017  . Other fatigue 05/25/2017  . Type 2 diabetes mellitus without complication, without long-term current use of insulin ([place]) 05/25/2017  . Uncontrolled hypertension 05/25/2017  . Chronic migraine without aura, with intractable migraine, so stated, with status migrainosus 03/20/2017  . S/P hysterectomy 07/16/2013    Rico Junker, PT, DPT 04/05/18    1:38 PM    [facility] [address] [place], [place], [address] Phone: [phone]   Fax:  [phone]  Name: Bridget Anderson MRN: [id number] Date of Birth: February 16, 1971

## 2018-04-06 ENCOUNTER — Ambulatory Visit: Payer: Self-pay | Admitting: Psychology

## 2018-04-09 ENCOUNTER — Encounter: Payer: Self-pay | Admitting: Physical Medicine & Rehabilitation

## 2018-04-09 ENCOUNTER — Encounter
Payer: No Typology Code available for payment source | Attending: Physical Medicine & Rehabilitation | Admitting: Physical Medicine & Rehabilitation

## 2018-04-09 VITALS — BP 171/121 | HR 63 | Resp 14 | Ht 66.0 in | Wt 220.0 lb

## 2018-04-09 DIAGNOSIS — E119 Type 2 diabetes mellitus without complications: Secondary | ICD-10-CM | POA: Diagnosis not present

## 2018-04-09 DIAGNOSIS — Z90710 Acquired absence of both cervix and uterus: Secondary | ICD-10-CM | POA: Insufficient documentation

## 2018-04-09 DIAGNOSIS — I10 Essential (primary) hypertension: Secondary | ICD-10-CM | POA: Insufficient documentation

## 2018-04-09 DIAGNOSIS — G43711 Chronic migraine without aura, intractable, with status migrainosus: Secondary | ICD-10-CM | POA: Diagnosis not present

## 2018-04-09 DIAGNOSIS — K219 Gastro-esophageal reflux disease without esophagitis: Secondary | ICD-10-CM | POA: Diagnosis not present

## 2018-04-09 DIAGNOSIS — G43909 Migraine, unspecified, not intractable, without status migrainosus: Secondary | ICD-10-CM | POA: Insufficient documentation

## 2018-04-09 DIAGNOSIS — Z8349 Family history of other endocrine, nutritional and metabolic diseases: Secondary | ICD-10-CM | POA: Insufficient documentation

## 2018-04-09 DIAGNOSIS — E785 Hyperlipidemia, unspecified: Secondary | ICD-10-CM | POA: Insufficient documentation

## 2018-04-09 DIAGNOSIS — G441 Vascular headache, not elsewhere classified: Secondary | ICD-10-CM | POA: Diagnosis not present

## 2018-04-09 DIAGNOSIS — G4733 Obstructive sleep apnea (adult) (pediatric): Secondary | ICD-10-CM | POA: Insufficient documentation

## 2018-04-09 DIAGNOSIS — H9319 Tinnitus, unspecified ear: Secondary | ICD-10-CM | POA: Insufficient documentation

## 2018-04-09 DIAGNOSIS — Z8249 Family history of ischemic heart disease and other diseases of the circulatory system: Secondary | ICD-10-CM | POA: Diagnosis not present

## 2018-04-09 DIAGNOSIS — R413 Other amnesia: Secondary | ICD-10-CM | POA: Diagnosis not present

## 2018-04-09 DIAGNOSIS — G4700 Insomnia, unspecified: Secondary | ICD-10-CM | POA: Diagnosis not present

## 2018-04-09 DIAGNOSIS — F0781 Postconcussional syndrome: Secondary | ICD-10-CM | POA: Diagnosis present

## 2018-04-09 MED ORDER — HYDROCHLOROTHIAZIDE 12.5 MG PO CAPS: 12.5000 mg | ORAL_CAPSULE | Freq: Every day | ORAL | 11 refills | Status: DC

## 2018-04-09 MED ORDER — NORTRIPTYLINE HCL 25 MG PO CAPS: 25.0000 mg | ORAL_CAPSULE | Freq: Every day | ORAL | 3 refills | Status: DC

## 2018-04-09 MED ORDER — DONEPEZIL HCL 10 MG PO TABS: 10.0000 mg | ORAL_TABLET | Freq: Every day | ORAL | 4 refills | Status: DC

## 2018-04-09 MED ORDER — METHYLPHENIDATE HCL ER (LA) 20 MG PO CP24: 20.0000 mg | ORAL_CAPSULE | ORAL | 0 refills | Status: DC

## 2018-04-09 NOTE — Patient Instructions (Addendum)
PLEASE FEEL FREE TO CALL OUR OFFICE WITH ANY PROBLEMS OR QUESTIONS ([phone])   PLEASE WORK ON  KEEPING YOUR MEMORY BOOK/ORGANIZER WITH YOU ALL THE TIME. YOU HAVE TO USE THIS TO HELP BUILD YOUR MEMORY. IT NEEDS TO BE THE BACKBONE OF HOW YOU ORGANIZE YOUR DAY.

## 2018-04-09 NOTE — Progress Notes (Signed)
Subjective:    Patient ID: Bridget Anderson, female    DOB: 02-16-1971, 48 y.o.   MRN: [id number]  HPI   Bridget Anderson is here in follow up of her PCS.  She is seeing outpt therapies at [facility].  She has been working with PT, OT and speech therapies.  She seems to be finding speech therapy is the most beneficial.  She now has assistance at the house with someone now making sure she is getting her medications.  Her blood pressures have remained high despite some changes by her primary.  Headaches are persistent and a 5-7 out of 10.  Headaches in the frontoparietal areas most often.  Sleep is been hit or miss.  Bridget Anderson states that when she wakes up that she will often get up and do the washer do a chore around the house.  She remains on nortriptyline 10 mg at bedtime as well as Aricept 5 mg at bedtime and Ritalin during the day 20 mg long-acting.  For blood pressure she is taking Lotensin 40 mg twice daily and Toprol-XL 100 mg daily.  She is keeping a notebook now and trying to be somewhat more organized with cueing of others.  She still has not typically initiating use of her organizer.  She still struggles with her memory quite a bit.  Pain Inventory Average Pain 5 Pain Right Now 5 My pain is constant, burning and aching  In the last 24 hours, has pain interfered with the following? General activity 7 Relation with others 7 Enjoyment of life 7 What TIME of day is your pain at its worst? varies Sleep (in general) Poor  Pain is worse with: walking, bending, sitting, inactivity, standing and some activites Pain improves with: rest and medication Relief from Meds: 2  Mobility walk without assistance ability to climb steps?  yes do you drive?  yes  Function employed # of hrs/week 0 disabled: date disabled workers comp  Neuro/Psych dizziness confusion depression anxiety  Prior Studies Any changes since last visit?  no  Physicians involved in your care Any changes since last  visit?  no   Family History  Problem Relation Age of Onset  . Hypertension Mother   . Hyperlipidemia Mother   . Hypertension Father   . Hyperlipidemia Father   . Thyroid disease Father   . Hypertension Brother    Social History   Socioeconomic History  . Marital status: Single    Spouse name: Not on file  . Number of children: 2  . Years of education: Not on file  . Highest education level: Associate degree: academic program  Occupational History  . Occupation: Administrator, arts  Social Needs  . Financial resource strain: Not on file  . Food insecurity:    Worry: Not on file    Inability: Not on file  . Transportation needs:    Medical: Not on file    Non-medical: Not on file  Tobacco Use  . Smoking status: Never Smoker  . Smokeless tobacco: Never Used  Substance and Sexual Activity  . Alcohol use: Yes    Comment: Rarely  . Drug use: No  . Sexual activity: Yes    Birth control/protection: Surgical  Lifestyle  . Physical activity:    Days per week: Not on file    Minutes per session: Not on file  . Stress: Not on file  Relationships  . Social connections:    Talks on phone: Not on file    Gets together:  Not on file    Attends religious service: Not on file    Active member of club or organization: Not on file    Attends meetings of clubs or organizations: Not on file    Relationship status: Not on file  Other Topics Concern  . Not on file  Social History Narrative   Lives at home with her daughter   Right handed   Drinks 3 cups of caffeine daily   Past Surgical History:  Procedure Laterality Date  . ABDOMINAL HYSTERECTOMY N/A 07/16/2013   Procedure: HYSTERECTOMY ABDOMINAL;  Surgeon: Allyn Kenner, DO;  Location: [facility];  Service: Gynecology;  Laterality: N/A;  . BILATERAL SALPINGECTOMY Bilateral 07/16/2013   Procedure: BILATERAL SALPINGECTOMY;  Surgeon: Allyn Kenner, DO;  Location: [facility];  Service: Gynecology;  Laterality: Bilateral;  . CARPAL TUNNEL  RELEASE  01/16/2012   Procedure: CARPAL TUNNEL RELEASE;  Surgeon: Nita Sells, MD;  Location: [facility];  Service: Orthopedics;  Laterality: Left;  . CARPAL TUNNEL RELEASE  02/20/2012   Procedure: CARPAL TUNNEL RELEASE;  Surgeon: Nita Sells, MD;  Location: [facility];  Service: Orthopedics;  Laterality: Right;  . COLONOSCOPY    . CYSTO WITH HYDRODISTENSION N/A 07/16/2013   Procedure: CYSTOSCOPY/HYDRODISTENSION;  Surgeon: Reece Packer, MD;  Location: [facility];  Service: Urology;  Laterality: N/A;  . DILATION AND CURETTAGE OF UTERUS    . FOOT SURGERY    . HYSTEROSCOPY W/D&C  10/31/2011   Procedure: DILATATION AND CURETTAGE /HYSTEROSCOPY;  Surgeon: Allyn Kenner, DO;  Location: [facility];  Service: Gynecology;  Laterality: N/A;  . LAPAROSCOPY N/A 07/16/2013   Procedure: LAPAROSCOPY DIAGNOSTIC;  Surgeon: Allyn Kenner, DO;  Location: [facility];  Service: Gynecology;  Laterality: N/A;  . PLANTAR FASCIA SURGERY    . PUBOVAGINAL SLING N/A 07/16/2013   Procedure: Gaynelle Arabian;  Surgeon: Reece Packer, MD;  Location: [facility];  Service: Urology;  Laterality: N/A;  . TUBAL LIGATION    . VAGINAL DELIVERY  1995,  2003   Past Medical History:  Diagnosis Date  . Chlamydia   . Diabetes ([place])   . GERD (gastroesophageal reflux disease)   . Hyperlipidemia   . Hypertension   . Migraine   . Obstructive sleep apnea on CPAP    pt has not worn CPAP in years  . Seasonal allergies   . Wears glasses    BP (!) 171/121   Pulse 63   Resp 14   Ht 5\' 6"  (1.676 m)   Wt 220 lb (99.8 kg)   LMP 06/27/2013   SpO2 98%   BMI 35.51 kg/m   Opioid Risk Score:   Fall Risk Score:  `1  Depression screen PHQ 2/9  Depression screen [facility] 2/9 03/07/2018 11/20/2017 05/25/2017  Decreased Interest 0 2 3  Down, Depressed, Hopeless 0 2 1  PHQ - 2 Score 0 4 4  Altered sleeping - 3 2  Tired, decreased energy - 2 3  Change in appetite - 2 1  Feeling bad or  failure about yourself  - 3 0  Trouble concentrating - 3 3  Moving slowly or fidgety/restless - 2 0  Suicidal thoughts - 1 0  PHQ-9 Score - 20 13  Difficult doing work/chores - Extremely dIfficult Not difficult at all    Review of Systems  Constitutional: Negative.   HENT: Negative.   Eyes: Negative.   Respiratory: Positive for apnea, cough, shortness of breath and wheezing.   Cardiovascular: Negative.  Gastrointestinal: Positive for diarrhea.  Endocrine: Negative.        High blood sugar Diabetic   Genitourinary: Negative.   Musculoskeletal: Negative.   Skin: Negative.   Allergic/Immunologic: Negative.   Neurological: Positive for dizziness and headaches.  Hematological: Negative.   Psychiatric/Behavioral: Positive for confusion and dysphoric mood. The patient is nervous/anxious.        Objective:   Physical Exam  General: No acute distress HEENT: EOMI, oral membranes moist Cards: reg rate  Chest: normal effort Abdomen: Soft, NT, ND Skin: small healing foliculitis left lateral foot Extremities: no edema Neuro:Patient is alert.  Language is normal.  She does not recognize me today.  She does not know why she is here.  She moves all fours.  Balance and strength are functional. improved attention. Still with profound memory deficits. Confabulates. Normal language. Musculoskeletal:Functional neck range of motion. No deformities of the head. Transferred easily from sit to stand.Marland Kitchen Psych:Patient very bright and in good spirits today.  Much more interactive.     Assessment & Plan:  1. Post concussionsyndrome with ongoing headaches, memory loss, tinnitus, vestibular symptoms, insomnia and emotional lability 2.History of migraine headaches since her middle school years. Patient had been seen twice by neurology here in [place]. Apparently she has been on numerous medications in the past for prophylaxis and treatment 3. Uncontrolled hypertension 4. DM  II    Plan: 1.  Making some gains with focus/attention. Still with profound memory deficits.  2. Continue outpt therapies. Reinforced need for memory book and schedule. neuropsych follow up pending.  Her memory is so profoundly affected that she will need to have some external source of organization to manage day-to-day activities and schedule.   3. Increase pamelor for sleep/headache to 25mg  qhs 4.Continue Aricept for memory, increase to 10mg   5.add HCTZ 12.5 mg daily for bp, further adjustments by primary. Need to get her bp under better control as her uncontrolled hypertension is most definitely tied to her headache intensity.      6.Follow-up with me in about 6 weeks time.   Neuropsychology  follow-up scheduled as well this week.  30 minutes were spent today.Marland Kitchen

## 2018-04-10 ENCOUNTER — Ambulatory Visit: Payer: No Typology Code available for payment source | Admitting: Physical Therapy

## 2018-04-10 ENCOUNTER — Ambulatory Visit: Payer: No Typology Code available for payment source

## 2018-04-10 ENCOUNTER — Ambulatory Visit: Payer: No Typology Code available for payment source | Admitting: Occupational Therapy

## 2018-04-10 ENCOUNTER — Encounter: Payer: Self-pay | Admitting: Physical Therapy

## 2018-04-10 DIAGNOSIS — R42 Dizziness and giddiness: Secondary | ICD-10-CM

## 2018-04-10 DIAGNOSIS — M6281 Muscle weakness (generalized): Secondary | ICD-10-CM

## 2018-04-10 DIAGNOSIS — R41844 Frontal lobe and executive function deficit: Secondary | ICD-10-CM

## 2018-04-10 DIAGNOSIS — R41841 Cognitive communication deficit: Secondary | ICD-10-CM

## 2018-04-10 DIAGNOSIS — R2681 Unsteadiness on feet: Secondary | ICD-10-CM

## 2018-04-10 DIAGNOSIS — R41840 Attention and concentration deficit: Secondary | ICD-10-CM

## 2018-04-10 MED ORDER — BUTALBITAL-APAP-CAFFEINE 50-325-40 MG PO TABS: 1.0000 | ORAL_TABLET | Freq: Four times a day (QID) | ORAL | 1 refills | Status: DC | PRN

## 2018-04-10 NOTE — Therapy (Signed)
[facility] [address] [place], [place], [address] Phone: [phone]   Fax:  [phone]  Occupational Therapy Treatment  Patient Details  Name: Bridget Anderson MRN: [id number] Date of Birth: [birth year] No data recorded  Encounter Date: 04/10/2018  OT End of Session - 04/10/18 1305    Visit Number  3    Number of Visits  16    Date for OT Re-Evaluation  06/03/18    Authorization Type  [employer]    OT Start Time  1022    OT Stop Time  1100    OT Time Calculation (min)  38 min    Activity Tolerance  Patient tolerated treatment well       Past Medical History:  Diagnosis Date  . Chlamydia   . Diabetes ([place])   . GERD (gastroesophageal reflux disease)   . Hyperlipidemia   . Hypertension   . Migraine   . Obstructive sleep apnea on CPAP    pt has not worn CPAP in years  . Seasonal allergies   . Wears glasses     Past Surgical History:  Procedure Laterality Date  . ABDOMINAL HYSTERECTOMY N/A 07/16/2013   Procedure: HYSTERECTOMY ABDOMINAL;  Surgeon: Allyn Kenner, DO;  Location: [facility];  Service: Gynecology;  Laterality: N/A;  . BILATERAL SALPINGECTOMY Bilateral 07/16/2013   Procedure: BILATERAL SALPINGECTOMY;  Surgeon: Allyn Kenner, DO;  Location: [facility];  Service: Gynecology;  Laterality: Bilateral;  . CARPAL TUNNEL RELEASE  01/16/2012   Procedure: CARPAL TUNNEL RELEASE;  Surgeon: Nita Sells, MD;  Location: [facility];  Service: Orthopedics;  Laterality: Left;  . CARPAL TUNNEL RELEASE  02/20/2012   Procedure: CARPAL TUNNEL RELEASE;  Surgeon: Nita Sells, MD;  Location: [facility];  Service: Orthopedics;  Laterality: Right;  . COLONOSCOPY    . CYSTO WITH HYDRODISTENSION N/A 07/16/2013   Procedure: CYSTOSCOPY/HYDRODISTENSION;  Surgeon: Reece Packer, MD;  Location: [facility];  Service: Urology;  Laterality: N/A;  . DILATION AND CURETTAGE OF UTERUS     . FOOT SURGERY    . HYSTEROSCOPY W/D&C  10/31/2011   Procedure: DILATATION AND CURETTAGE /HYSTEROSCOPY;  Surgeon: Allyn Kenner, DO;  Location: [facility];  Service: Gynecology;  Laterality: N/A;  . LAPAROSCOPY N/A 07/16/2013   Procedure: LAPAROSCOPY DIAGNOSTIC;  Surgeon: Allyn Kenner, DO;  Location: [facility];  Service: Gynecology;  Laterality: N/A;  . PLANTAR FASCIA SURGERY    . PUBOVAGINAL SLING N/A 07/16/2013   Procedure: Gaynelle Arabian;  Surgeon: Reece Packer, MD;  Location: [facility];  Service: Urology;  Laterality: N/A;  . TUBAL LIGATION    . [place],  2003    There were no vitals filed for this visit.  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 PMH: HTN, DM, migraines    Limitations  decreased memory and awareness into deficits    Currently in Pain?  Yes    Pain Score  5     Pain Location  Head    Pain Descriptors / Indicators  Headache    Pain Onset  More than a month ago    Pain Frequency  Intermittent    Aggravating Factors   noise    Pain Relieving Factors  quiet       Pt reports taping helped w/ pain LUE. Re-applied kinesiotape to relax wrist extensors and middle deltoid. Reviewed wear/care w/ caregiver.  Reviewed theraband and putty HEP.  Pt returned demo of theraband HEP x 10 reps  Issued and reviewed memory compensatory strategies and how to practically implement w/ medication management and cooking (pt will still need sup w/ this, and caregiver will still need to distribute pills into pillbox). Also discussed memory notebook - pt was using today in session                    OT Education - 04/10/18 1042    Education Details  review of putty and theraband HEP, Memory strategies    Person(s) Educated  Patient;Caregiver(s)    Methods  Explanation;Demonstration;Handout;Verbal cues    Comprehension  Verbalized understanding;Returned demonstration       OT Short Term Goals - 04/05/18 1150      OT  SHORT TERM GOAL #1   Title  Independent with HEP for LUE strength and grip strength - due 05/03/18    Time  4    Period  Weeks    Status  On-going      OT SHORT TERM GOAL #2   Title  Pt to verbalize understanding with memory strategies for functional tasks     Time  4    Period  Weeks    Status  New      OT SHORT TERM GOAL #3   Title  Pt to report dizziness less than 5/10 with functional tasks (loading/unloading dishwasher, laundry tasks, etc)     Time  4    Period  Weeks    Status  New      OT SHORT TERM GOAL #4   Title  Pt to perform simple familiar cooking task w/ no more than min cueing    Time  4    Period  Weeks    Status  New      OT SHORT TERM GOAL #5   Title  Pt to attend to cognitive task for 10 min. in quiet environment in prep for meal planning/money management    Time  4    Period  Weeks    Status  New        OT Long Term Goals - 04/04/18 1440      OT LONG TERM GOAL #1   Title  Pt to improve grip strength Lt hand to 35 lbs or greater - due 06/03/18    Baseline  eval = 18 lbs, reeval on 04/04/18 = 25 lbs    Time  8    Period  Weeks    Status  Revised      OT LONG TERM GOAL #2   Title  Pt to perform simple novel cooking task with min cues only    Time  8    Period  Weeks    Status  New      OT LONG TERM GOAL #3   Title  Pt to attend to simple cognitve task (meal planning, basic money management) in moderately distracting enviornment for 15 min. 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, HTN, DM, migraines    Occupational performance deficits (Please refer to evaluation for details):  IADL's;Work;Leisure;Social Participation    Rehab Potential  Fair    OT Frequency  2x / week    OT Duration  8 weeks    OT Treatment/Interventions  Self-care/ADL training;Therapeutic exercise;Visual/perceptual remediation/compensation;Coping strategies training;Patient/family education;Therapist, nutritional;Therapeutic activities;DME and/or AE instruction;Cognitive remediation/compensation    Plan  issue BI support group info, practice simple cooking (egg, grilled cheese) w/ sup, gripper activitiy    Consulted and Agree with Plan of Care  Patient;Other (Comment)       Patient will benefit from skilled therapeutic intervention in order to improve the following deficits and impairments:  Decreased cognition, Impaired vision/preception, Pain, Decreased endurance, Decreased activity tolerance, Decreased strength, Decreased coping skills, Decreased knowledge of precautions, Decreased safety awareness, Impaired perceived functional ability, Impaired UE functional use  Visit Diagnosis: Muscle weakness (generalized)  Attention and concentration deficit  Frontal lobe and executive function deficit    Problem List Patient Active Problem List   Diagnosis Date Noted  . Anterograde amnesia   . Postconcussive syndrome 01/01/2018  . Asthma   . Essential hypertension   . Diabetes mellitus type 2 in obese ([facility])   . Dyslipidemia   . Vascular headache   . Chronic post-traumatic headache, not intractable 11/20/2017  . Shortness of breath on exertion 05/25/2017  . Other fatigue 05/25/2017  . Type 2 diabetes mellitus without complication, without long-term current use of insulin ([facility]) 05/25/2017  . Uncontrolled hypertension 05/25/2017  . Chronic migraine without aura, with intractable migraine, so stated, with status  migrainosus 03/20/2017  . S/P hysterectomy 07/16/2013    Carey Bullocks, OTR/L 04/10/2018, 1:08 PM  [facility] [address] [place], [place], [address] Phone: [phone]   Fax:  [phone]  Name: ARACELI ARANGO MRN: [id number] Date of Birth: June 09, 1970

## 2018-04-10 NOTE — Addendum Note (Signed)
Addended by: Geryl Rankins D on: 04/10/2018 09:19 AM   Modules accepted: Orders

## 2018-04-10 NOTE — Therapy (Addendum)
[facility] [address] [place], [place], [address] Phone: [phone]   Fax:  [phone]  Speech Language Pathology Treatment  Patient Details  Name: Bridget Anderson MRN: [id number] Date of Birth: [birth year] Referring Provider (SLP): Alger Simons, MD   Encounter Date: 04/10/2018  End of Session - 04/10/18 1421    Visit Number  7   Number of Visits  25    Date for SLP Re-Evaluation  04/19/18    Authorization - Number of Visits  25    SLP Start Time  7169    SLP Stop Time   1145    SLP Time Calculation (min)  40 min    Activity Tolerance  Patient tolerated treatment well       Past Medical History:  Diagnosis Date  . Chlamydia   . Diabetes ([facility])   . GERD (gastroesophageal reflux disease)   . Hyperlipidemia   . Hypertension   . Migraine   . Obstructive sleep apnea on CPAP    pt has not worn CPAP in years  . Seasonal allergies   . Wears glasses     Past Surgical History:  Procedure Laterality Date  . ABDOMINAL HYSTERECTOMY N/A 07/16/2013   Procedure: HYSTERECTOMY ABDOMINAL;  Surgeon: Allyn Kenner, DO;  Location: [facility];  Service: Gynecology;  Laterality: N/A;  . BILATERAL SALPINGECTOMY Bilateral 07/16/2013   Procedure: BILATERAL SALPINGECTOMY;  Surgeon: Allyn Kenner, DO;  Location: [facility];  Service: Gynecology;  Laterality: Bilateral;  . CARPAL TUNNEL RELEASE  01/16/2012   Procedure: CARPAL TUNNEL RELEASE;  Surgeon: Nita Sells, MD;  Location: [facility];  Service: Orthopedics;  Laterality: Left;  . CARPAL TUNNEL RELEASE  02/20/2012   Procedure: CARPAL TUNNEL RELEASE;  Surgeon: Nita Sells, MD;  Location: [facility];  Service: Orthopedics;  Laterality: Right;  . COLONOSCOPY    . CYSTO WITH HYDRODISTENSION N/A 07/16/2013   Procedure: CYSTOSCOPY/HYDRODISTENSION;  Surgeon: Reece Packer, MD;  Location: [facility];  Service: Urology;  Laterality: N/A;   . DILATION AND CURETTAGE OF UTERUS    . FOOT SURGERY    . HYSTEROSCOPY W/D&C  10/31/2011   Procedure: DILATATION AND CURETTAGE /HYSTEROSCOPY;  Surgeon: Allyn Kenner, DO;  Location: [facility];  Service: Gynecology;  Laterality: N/A;  . LAPAROSCOPY N/A 07/16/2013   Procedure: LAPAROSCOPY DIAGNOSTIC;  Surgeon: Allyn Kenner, DO;  Location: [facility];  Service: Gynecology;  Laterality: N/A;  . PLANTAR FASCIA SURGERY    . PUBOVAGINAL SLING N/A 07/16/2013   Procedure: Gaynelle Arabian;  Surgeon: Reece Packer, MD;  Location: [facility];  Service: Urology;  Laterality: N/A;  . TUBAL LIGATION    . [place],  2003    There were no vitals filed for this visit.  Subjective Assessment - 04/10/18 1122    Subjective  "He don't like me very much does he?" (pt, re: visit with Naaman Plummer yesterday after SLP told pt MD assessed pt memory as "profoundly affected")    Patient is accompained by:  Rutherford Nail, aid   Currently in Pain?  Yes    Pain Score  5     Pain Location  Head    Pain Orientation  Left    Pain Descriptors / Indicators  Headache    Pain Type  Chronic pain    Pain Onset  More than a month ago    Pain Frequency  Intermittent    Aggravating Factors   noise,  activity    Pain Relieving Factors  quiet            ADULT SLP TREATMENT - 04/10/18 1124      General Information   Behavior/Cognition  Alert;Cooperative;Pleasant mood;Distractible;Requires cueing;Decreased sustained attention      Treatment Provided   Treatment provided  Cognitive-Linquistic      Cognitive-Linquistic Treatment   Treatment focused on  Cognition    Skilled Treatment  "Glendell Docker?" (pt's greeting to SLP). Pt without any memory of MD Naaman Plummer) appointment yesterday until she looked in her notebook but then asked SLP who the MD was and what he said. Pt stated "you got to reword that" when SLP told pt MD noted pt has profound memory defiict. SLP told pt that was an accurate description of her deficit so  there was no need for SLP to reword. Pt then gave "s" statement. SLP asked pt what she did over the weekend - pt req'd cues to look in her book. SLP educated(re-educated) aide to cue pt to write things in pt's book throughout the day. SLP asked pt what she did earlier today and she spontaneously looked in her notebook and told SLP.  Pt did not write anything about the weekend, "I was probably with my baby girl, she's always with me." SLP used this situation as contrast with pt in that she told me specific details about her day yesterday because she wrote it down. Reiterated to pt throughout session that likely the only way she will remember things is if she writes them down. Pt confabulated about SLP telling her she could return to work if she was using the book. SLP again explained confabulation to pt/aide and educated aide that she can assist pt when pt confabulates. Pt had wirtten in her notebook that she burned bacon in the oven yesterday and so SLP took this opportunity to tell pt/remind pt that she should NOT be cooking unless someone else is in the house and watching pt - pt stated "in case I forget". SLP told pt that was the reason.       Assessment / Recommendations / Plan   Plan  Continue with current plan of care      Progression Toward Goals   Progression toward goals  Progressing toward goals   learning to use memory notebook spontaneously      SLP Education - 04/10/18 1419    Education Details  severity of deficits, importance of using memory notebook, aide should assist pt with frequency of pt use of notebook and correcting pt confabulation, needs supervision for cooking    Person(s) Educated  Patient;Caregiver(s)    Methods  Explanation;Demonstration;Verbal cues    Comprehension  Verbalized understanding;Verbal cues required;Need further instruction       SLP Short Term Goals - 04/10/18 1424      SLP SHORT TERM GOAL #1   Title  pt will demo use of memory enhancing system outside  of ST session (i.e., writing down items/entries in a memory journal) between 4 sessions to enhance or provide a means of functional memory for pt    Baseline  04-10-18    Time  2    Period  Weeks    Status  Revised   from 8 sessions to 4     SLP [place] #2   Title  pt will tell SLP 3 practical ramifications of her cognitive deficits with modified independence (i.e., written notes) in 4 sessions    Time  2  Period  Weeks    Status  On-going      SLP SHORT TERM GOAL #3   Title  pt will give adequate synopsis of salient written material she enjoys with modified independence over two sessions    Time  2    Period  Weeks    Status  On-going       SLP Long Term Goals - 04/10/18 1425      SLP LONG TERM GOAL #1   Title  pt will demo appropriate usage of a memory enhancement/compensation system between 10 ST sessions    Baseline  04-10-18    Time  8    Period  Weeks    Status  On-going       Plan - 04/10/18 1422    Clinical Impression Statement   Pt presents with profound memory deficits (memory encoding?) - pt demo'd sustained memory of approx 60 seconds. Pt engaging in unsafe behavior at home due to decr'd attention/awareness as she burned bacon in teh oven. SLP told pt to use microwave if she wants to cook, or be supervised. Pt learning how to use memory notebook, requires cues to use/look in book. See skilled intervention for details about today's visit. Aide, Charlena Cross, now attending with pt in hopes pt's environment and cueing as well as reminders to use compensations will be more consistent, this will positively impact pt's memory skills. Pt would benefit from skilled ST to develop a system to enhance her memory so that she can increase her independence and her quality of life. If pt does not appear to recall things more consistently from previous ST sessions in approx 3-4 more sessions, d/c may be appropriate.     Speech Therapy Frequency  2x / week    Duration  --   12  weeks/25 sessions   Treatment/Interventions  Compensatory strategies;Patient/family education;Functional tasks;Cueing hierarchy;Cognitive reorganization;Environmental controls;Internal/external aids;SLP instruction and feedback    Potential to Achieve Goals  Fair    Potential Considerations  Cooperation/participation level;Severity of impairments;Ability to learn/carryover information       Patient will benefit from skilled therapeutic intervention in order to improve the following deficits and impairments:   Cognitive communication deficit    Problem List Patient Active Problem List   Diagnosis Date Noted  . Anterograde amnesia   . Postconcussive syndrome 01/01/2018  . Asthma   . Essential hypertension   . Diabetes mellitus type 2 in obese ([place])   . Dyslipidemia   . Vascular headache   . Chronic post-traumatic headache, not intractable 11/20/2017  . Shortness of breath on exertion 05/25/2017  . Other fatigue 05/25/2017  . Type 2 diabetes mellitus without complication, without long-term current use of insulin ([place]) 05/25/2017  . Uncontrolled hypertension 05/25/2017  . Chronic migraine without aura, with intractable migraine, so stated, with status migrainosus 03/20/2017  . S/P hysterectomy 07/16/2013    [facility] ,MS, CCC-SLP  04/10/2018, 2:25 PM  [facility] [address] [place], [place], [address] Phone: [phone]   Fax:  [phone]   Name: Bridget Anderson MRN: [id number] Date of Birth: 04-Aug-1970

## 2018-04-10 NOTE — Patient Instructions (Signed)

## 2018-04-10 NOTE — Patient Instructions (Signed)
Use your memory notebook throughout the day. Your notebook is your brain, in order to remember things.

## 2018-04-11 NOTE — Therapy (Signed)
[facility] [address] [place], [place], [address] Phone: [phone]   Fax:  [phone]  Physical Therapy Treatment  Patient Details  Name: Bridget Anderson MRN: [id number] Date of Birth: 05/07/1970 Referring Provider (PT): Dr. Oval Linsey   Encounter Date: 04/10/2018  PT End of Session - 04/10/18 1235    Visit Number  8    Number of Visits  13    Date for PT Re-Evaluation  04/06/18    Authorization Type  Workers Comp    PT Start Time  1147    PT Stop Time  1235    PT Time Calculation (min)  48 min    Activity Tolerance  Patient limited by pain;Treatment limited secondary to medical complications (Comment)   reported feeling light-headed and had to stop to sit x 1; incr headache and had to stop x 1   Behavior During Therapy  [facility] for tasks assessed/performed   unable to remember items during therapy      Past Medical History:  Diagnosis Date  . Chlamydia   . Diabetes ([place])   . GERD (gastroesophageal reflux disease)   . Hyperlipidemia   . Hypertension   . Migraine   . Obstructive sleep apnea on CPAP    pt has not worn CPAP in years  . Seasonal allergies   . Wears glasses     Past Surgical History:  Procedure Laterality Date  . ABDOMINAL HYSTERECTOMY N/A 07/16/2013   Procedure: HYSTERECTOMY ABDOMINAL;  Surgeon: Allyn Kenner, DO;  Location: [facility];  Service: Gynecology;  Laterality: N/A;  . BILATERAL SALPINGECTOMY Bilateral 07/16/2013   Procedure: BILATERAL SALPINGECTOMY;  Surgeon: Allyn Kenner, DO;  Location: [facility];  Service: Gynecology;  Laterality: Bilateral;  . CARPAL TUNNEL RELEASE  01/16/2012   Procedure: CARPAL TUNNEL RELEASE;  Surgeon: Nita Sells, MD;  Location: [facility];  Service: Orthopedics;  Laterality: Left;  . CARPAL TUNNEL RELEASE  02/20/2012   Procedure: CARPAL TUNNEL RELEASE;  Surgeon: Nita Sells, MD;  Location: [facility];   Service: Orthopedics;  Laterality: Right;  . COLONOSCOPY    . CYSTO WITH HYDRODISTENSION N/A 07/16/2013   Procedure: CYSTOSCOPY/HYDRODISTENSION;  Surgeon: Reece Packer, MD;  Location: [facility];  Service: Urology;  Laterality: N/A;  . DILATION AND CURETTAGE OF UTERUS    . FOOT SURGERY    . HYSTEROSCOPY W/D&C  10/31/2011   Procedure: DILATATION AND CURETTAGE /HYSTEROSCOPY;  Surgeon: Allyn Kenner, DO;  Location: [facility];  Service: Gynecology;  Laterality: N/A;  . LAPAROSCOPY N/A 07/16/2013   Procedure: LAPAROSCOPY DIAGNOSTIC;  Surgeon: Allyn Kenner, DO;  Location: [facility];  Service: Gynecology;  Laterality: N/A;  . PLANTAR FASCIA SURGERY    . PUBOVAGINAL SLING N/A 07/16/2013   Procedure: Gaynelle Arabian;  Surgeon: Reece Packer, MD;  Location: [facility];  Service: Urology;  Laterality: N/A;  . TUBAL LIGATION    . [place],  2003    There were no vitals filed for this visit.  Subjective Assessment - 04/10/18 1149    Subjective  Headache today. Able to recall that she was hurt at work, but not sure why she is coming here (to therapy). Later in session reports that she frequently lies down to help ease her headaches. Doesn't want to allow them to escalate to the point that she's irritable, short-tempered    Patient is accompained by:  --   caregiver, Ebony (with pt Tues-Friday)   Pertinent  History  Pt hit by 85# pipe at work June 2019, post-concussive syndrome,memory deficits    Patient Stated Goals  Pt's goal is to stop hurting (02/05/18) (regarding headaches)    Currently in Pain?  Yes    Pain Score  5     Pain Location  Head    Pain Orientation  Left    Pain Descriptors / Indicators  Headache    Pain Type  Chronic pain    Pain Onset  More than a month ago    Pain Frequency  Constant    Aggravating Factors   noise, activity    Pain Relieving Factors  quiet    Effect of Pain on Daily Activities  I get impatient and short-tempered (I don't like that)           Treatment- Neuro re-ed-   Vestibular Treatment/Exercise - 04/10/18 1700      Vestibular Treatment/Exercise   Vestibular Treatment Provided  Gaze    Gaze Exercises  X1 Viewing Horizontal      X1 Viewing Horizontal   Foot Position  seated, 6 ft to target    Time  --   up to 20 sec   Reps  3    Comments  stops when target becomes blurry; +dizzy/nausea      Walking with cognitive challenges (verbalizing how to make ox-tail stew--ingredients, steps, etc) while walking with turns, varying speeds, occasionally adding passing tennis ball from hand to hand. Ultimately became "light-headed" per pt (?dizzy/nausea) requiring seated rest. Patient noted to drift off path and stagger step x 1 during activity. Resumed walking with cognitive challenge with pt then reporting increased headache and required seated rest.  Self-care-ways to decrease HA without lying down or napping. (See education and clinical impression)      PT Education - 04/11/18 [id number]    Education Details  educated caregiver, Charlena Cross, that pt needs to work on VORx1 exercise 3 times per day (she reports she does it ~2x per WEEK); caregiver does not want to push the exercise because it makes pt feel worse; explained rationale; educated pt/caregiver on trying to work through decreasing headache without lying down; try sitting in a quiet place and relaxation techniques;     Person(s) Educated  Patient;Caregiver(s)    Methods  Explanation;Demonstration;Verbal cues    Comprehension  Verbalized understanding;Returned demonstration;Verbal cues required;Need further instruction       PT Short Term Goals - 04/05/18 1334      PT SHORT TERM GOAL #1   Title  = LTG        PT Long Term Goals - 04/05/18 1334      PT LONG TERM GOAL #1   Title  Pt will demonstrate compliance with vestibular and balance HEP with supervision and guidance from family or aide    Time  4    Period  Weeks    Status  New    Target Date  05/05/18       PT LONG TERM GOAL #2   Title  Pt will consistently report HA </= 4/10 during therapy and while performing HEP    Time  4    Period  Weeks    Status  New    Target Date  05/05/18      PT LONG TERM GOAL #3   Title  patient to improve DGI to at least 19/24 for demonstrating reduced fall risk    Baseline  same as before 14/24     Time  4    Period  Weeks    Status  New    Target Date  05/05/18      PT LONG TERM GOAL #4   Title  Pt will improve gait velocity to >/= 3.6 ft/sec without AD    Baseline  3.0 ft/sec no AD    Time  4    Period  Weeks    Status  New    Target Date  05/05/18      PT LONG TERM GOAL #5   Title  Pt will demonstrate </= 25% difference between TUG and COG TUG    Baseline  50% difference    Time  4    Period  Weeks    Status  New    Target Date  05/05/18            Plan - 04/11/18 0834    Clinical Impression Statement  Session focused on dual-task training with gait and cognitive or bimanual tasks to distract from her focus on walking/balance. She repeatedly stopped walking when trying to think of what she wanted to say and after 300 ft with max cues she suddenly stated she felt light-headed and had to sit. Unclear if she was experiencing light-headedness or dizziness as she has a difficult time describing. After rest and return to activity, she reported headache up to 6/10 (session began at 5/10--difficult to know if pt rating appropriately with decr memory/cognition). Seated rest and she stated all she wanted to do was lie down to reduce her headache. Encouraged to relax in sitting position. After several minutes, moved to outdoors (quieter environment) and educated on taking short rest breaks in sitting with quiet environment to reduce her headaches as this worked well for her today. ? ability to make progress with headaches remaining a limiting factor and caregiver admittedly not wanting to assist pt with HEP due to increases pt's symptoms. Noted Dr Naaman Plummer  increased her nortryptiline and added to BP meds on 04/09/18 to further assist with managing headaches.     Clinical Impairments Affecting Rehab Potential  Severity of (cognitive) deficits following injury; family involvement (sister working, has possible hired caregiver for home)    PT Frequency  2x / week    PT Duration  4 weeks    PT Treatment/Interventions  ADLs/Self Care Home Management;Gait training;Functional mobility training;Therapeutic activities;Therapeutic exercise;Patient/family education;Neuromuscular re-education;Balance training;Canalith Repostioning;Visual/perceptual remediation/compensation;Vestibular;Stair training;Cognitive remediation    PT Next Visit Plan  CHECK BP; emphasize to caregiver need to do HEP for progress to be achieved; give pt guided relaxation ideas (?app or handout?); continue to review and update HEP.  Simple dual task activities during gait.  Standing balance and gait with head turns to patient tolerance.  Obstacle negotiation and safety in community.  Gait outside.     PT Home Exercise Plan  [email or web address]    Consulted and Agree with Plan of Care  Patient;Family member/caregiver    Family Member [employer] - aide       Patient will benefit from skilled therapeutic intervention in order to improve the following deficits and impairments:  Abnormal gait, Decreased balance, Decreased safety awareness, Decreased mobility, Difficulty walking, Postural dysfunction, Dizziness, Decreased activity tolerance  Visit Diagnosis: Dizziness and giddiness  Unsteadiness on feet     Problem List Patient Active Problem List   Diagnosis Date Noted  . Anterograde amnesia   . Postconcussive syndrome 01/01/2018  . Asthma   . Essential hypertension   . Diabetes mellitus type 2  in obese ([place])   . Dyslipidemia   . Vascular headache   . Chronic post-traumatic headache, not intractable 11/20/2017  . Shortness of breath on exertion 05/25/2017  . Other fatigue 05/25/2017  .  Type 2 diabetes mellitus without complication, without long-term current use of insulin ([place]) 05/25/2017  . Uncontrolled hypertension 05/25/2017  . Chronic migraine without aura, with intractable migraine, so stated, with status migrainosus 03/20/2017  . S/P hysterectomy 07/16/2013    Rexanne Mano, PT 04/11/2018, 11:52 AM  [facility] [address] [place], [place], [address] Phone: [phone]   Fax:  [phone]  Name: JASON HAUGE MRN: [id number] Date of Birth: [birth year]

## 2018-04-13 ENCOUNTER — Encounter: Payer: No Typology Code available for payment source | Admitting: Psychology

## 2018-04-13 ENCOUNTER — Ambulatory Visit: Payer: No Typology Code available for payment source

## 2018-04-13 ENCOUNTER — Encounter: Payer: Self-pay | Admitting: Psychology

## 2018-04-13 ENCOUNTER — Ambulatory Visit: Payer: No Typology Code available for payment source | Admitting: Physical Therapy

## 2018-04-13 DIAGNOSIS — F0781 Postconcussional syndrome: Secondary | ICD-10-CM | POA: Diagnosis not present

## 2018-04-13 NOTE — Progress Notes (Signed)
BEHAVIOR OBSERVATIONS: Patient was around 10 minutes late to her 13:00pm testing appointment. She was administered the Wechsler Memory Scale, 4th Edition, Adult Battery, which lasted 150 minutes.  She was mostly oriented but gave incorrect day of week and month (e.g. stated day as Thursday the 17th). She was appropriately dressed and well groomed. Effort was variable and she required frequent redirection. She became significantly frustrated on both immediate and delayed recall trials of the WMS-IV and performed poorly on recognition tasks. She expressed desire to quite from the onset of testing but decided to complete at least one battery of tests.  Mood appeared anxious and depressed. Affect was appropriate and congruent with mood. Thought processes appeared disorganized and goal directed, with concrete thinking noted. Next testing session (e.g. complete WAIS-IV, TOMM, etc.) will be scheduled some time next week after coordinating with patient's sister.   Results of the WMS-IV, Adult Battery are as follows:   Brief Cognitive Status Exam Classification  Age Years of Education Raw Score Classification Level Base Rate  47 years 8 months 14 34 Very Low 0.0    Index Score Summary  Index Sum of Scaled Scores Index Score Percentile Rank 95% Confidence Interval Qualitative Descriptor  Auditory Memory (AMI) 19 69 2 64-77 Extremely Low  Visual Memory (VMI) 30 85 16 80-91 Low Average  Visual Working Memory (VWMI) 13 80 9 74-89 Low Average  Immediate Memory (IMI) 28 80 9 75-87 Low Average  Delayed Memory (DMI) 21 67 1 62-76 Extremely Low   Primary Subtest Scaled Score Summary  Subtest Domain Raw Score Scaled Score Percentile Rank  Logical Memory I AM 17 6 9   Logical Memory II AM 6 3 1   Verbal [employer] I AM 18 6 9   Verbal [employer] II AM 3 4 2   Designs I VM 72 11 63  Designs II VM 50 9 37  Visual Reproduction I VM 25 5 5   Visual Reproduction II VM 7 5 5   Spatial Addition VWM 7 6 9     Symbol Span VWM 15 7 16    Auditory Memory Process Score Summary  Process Score Raw Score Scaled Score Percentile Rank Cumulative Percentage (Base Rate)  LM II Recognition 19 - - 3-9%  VPA II Recognition 24 - - <=2%  Visual Memory Process Score Summary  Process Score Raw Score Scaled Score Percentile Rank Cumulative Percentage (Base Rate)  DE II Recognition 10 - - 3-9%  VR II Recognition 2 - - <=2%      WMS-IV Indexes  Score Score 1 Score 2 Contrast Scaled Score  Auditory Memory Index vs. Visual Memory Index 69 85 10  Visual Working Memory Index vs. Visual Memory Index 80 85 9  Immediate Memory Index vs. Delayed Memory Index 80 67 3

## 2018-04-19 ENCOUNTER — Ambulatory Visit: Payer: No Typology Code available for payment source | Admitting: Occupational Therapy

## 2018-04-19 ENCOUNTER — Ambulatory Visit: Payer: No Typology Code available for payment source | Admitting: Physical Therapy

## 2018-04-24 ENCOUNTER — Encounter: Payer: Self-pay | Admitting: Physical Therapy

## 2018-04-24 ENCOUNTER — Ambulatory Visit: Payer: No Typology Code available for payment source | Admitting: Physical Therapy

## 2018-04-24 ENCOUNTER — Ambulatory Visit
Payer: No Typology Code available for payment source | Attending: Physical Medicine & Rehabilitation | Admitting: Occupational Therapy

## 2018-04-24 ENCOUNTER — Ambulatory Visit: Payer: No Typology Code available for payment source

## 2018-04-24 DIAGNOSIS — M542 Cervicalgia: Secondary | ICD-10-CM

## 2018-04-24 DIAGNOSIS — R2681 Unsteadiness on feet: Secondary | ICD-10-CM | POA: Diagnosis present

## 2018-04-24 DIAGNOSIS — R41841 Cognitive communication deficit: Secondary | ICD-10-CM

## 2018-04-24 DIAGNOSIS — R2689 Other abnormalities of gait and mobility: Secondary | ICD-10-CM | POA: Insufficient documentation

## 2018-04-24 DIAGNOSIS — R42 Dizziness and giddiness: Secondary | ICD-10-CM | POA: Insufficient documentation

## 2018-04-24 DIAGNOSIS — M6281 Muscle weakness (generalized): Secondary | ICD-10-CM | POA: Diagnosis present
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
## 2018-04-24 NOTE — Patient Instructions (Signed)
Access Code: [email or web address]  URL: [email or web address]  Date: 04/24/2018  Prepared by: Barry Brunner   Exercises  Romberg Stance with Head Rotation - 10 reps - 2 sets - 2x daily - 5x weekly  Walking Tandem Stance - 10 reps - 3 sets - 1x daily - 7x weekly   ADDED: Supine Chin Tuck - 3 reps - 1 sets - 30 seconds hold - 2-3x daily - 7x weekly  Seated Gentle Upper Trapezius Stretch - 3 reps - 1 sets - 30 seconds hold - 2-3x daily - 7x weekly
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
## 2018-04-26 ENCOUNTER — Encounter: Payer: Self-pay | Admitting: Psychology

## 2018-04-26 ENCOUNTER — Encounter: Payer: No Typology Code available for payment source | Attending: Psychology | Admitting: Psychology

## 2018-04-26 DIAGNOSIS — F0781 Postconcussional syndrome: Secondary | ICD-10-CM | POA: Diagnosis present

## 2018-04-26 NOTE — Progress Notes (Addendum)
BEHAVIOR OBSERVATIONS: Patient arrived on time to her 10:00am testing appointment. She was administered the Wechsler Adult Intelligence Scale, 4th Edition and Test of Memory Malingering, which lasted 180 minutes. She was appropriately dressed and well groomed. She was cooperative with most assigned tasks but gave variable effort (see results of TOMM below). Mood appeared more bright than previous testing session (04/13/2018) but still low overall. Affect was mostly appropriate and congruent with mood. Thought processes appeared disorganized and goal directed, with concrete thinking noted. She denied having any memory of the previous testing session on 04/13/2018 and claimed she did not know/had never seen this examiner before. She also denied any knowledge of the reason for the current visit.   Of note, she received credit for 5-6 items on the WAIS-IV: Matrix Reasoning that were selected at random (e.g. pt. expressed not knowing the answer and taking "wild guess"), which inflated her score. Thus, results of this task may represent an over estimate of her true perceptual reasoning ability.   Results of the WAIS-IV are as follows:   Composite Score Summary  Scale Sum of Scaled Scores Composite Score Percentile Rank 95% Conf. Interval Qualitative Description  Verbal Comprehension 25 VCI 91 27 86-97 Average  Perceptual Reasoning 33 PRI 105 63 99-111 Average  Working Memory 9 WMI 69 2 64-78 Extremely Low  Processing Speed 16 PSI 89 23 82-98 Low Average  Full Scale 83 FSIQ 88 21 84-92 Low Average  General Ability 58 GAI 98 45 93-103 Average   Index Level Discrepancy Comparisons  Comparison Score 1 Score 2 Difference Critical Value .05 Significant Difference Y/N Base Rate by Overall Sample  VCI - PRI 91 105 -14 7.78 Y 15.0  VCI - WMI 91 69 22 8.31 Y 4.4  VCI - PSI 91 89 2 11.76 N 46.2  PRI - WMI 105 69 36 8.81 Y 0.5  PRI - PSI 105 89 16 12.12 Y 15.0  WMI - PSI 69 89 -20 12.47 Y 8.8  FSIQ - GAI  88 98 -10 3.29 Y 2.3   ANALYSIS   Index Level Discrepancy Comparisons  Comparison Score 1 Score 2 Difference Critical Value .05 Significant Difference Y/N Base Rate by Overall Sample  VCI - PRI 91 105 -14 7.78 Y 15.0  VCI - WMI 91 69 22 8.31 Y 4.4  VCI - PSI 91 89 2 11.76 N 46.2  PRI - WMI 105 69 36 8.81 Y 0.5  PRI - PSI 105 89 16 12.12 Y 15.0  WMI - PSI 69 89 -20 12.47 Y 8.8  FSIQ - GAI 88 98 -10 3.29 Y 2.3   Differences Between Subtest and Overall Mean of Subtest Scores  Subtest Subtest Scaled Score Mean Scaled Score Difference Critical Value .05 Strength or Weakness Base Rate  Block Design 8 8.30 -0.30 2.85  >25%  Similarities 9 8.30 0.70 2.82  >25%  Digit Span 4 8.30 -4.30 2.22 W 2-5%  Matrix Reasoning 14 8.30 5.70 2.54 S <1%  Vocabulary 8 8.30 -0.30 2.03  >25%  Arithmetic 5 8.30 -3.30 2.73 W 5-10%  Symbol Search 6 8.30 -2.30 3.42  >25%  Visual Puzzles 11 8.30 2.70 2.71  15-25%  Information 8 8.30 -0.30 2.19  >25%  Coding 10 8.30 1.70 2.97  >25%   Results of the TOMM are as follows:   Trial 1: 29/50, Invalid  Trail 2: 30/50, Invalid Retention: 28/50, Invalid

## 2018-04-27 ENCOUNTER — Encounter: Payer: Self-pay | Admitting: Physical Therapy

## 2018-04-27 ENCOUNTER — Ambulatory Visit: Payer: No Typology Code available for payment source

## 2018-04-27 ENCOUNTER — Ambulatory Visit: Payer: No Typology Code available for payment source | Admitting: Physical Therapy

## 2018-04-27 DIAGNOSIS — M6281 Muscle weakness (generalized): Secondary | ICD-10-CM | POA: Diagnosis not present

## 2018-04-27 DIAGNOSIS — R42 Dizziness and giddiness: Secondary | ICD-10-CM

## 2018-04-27 DIAGNOSIS — M542 Cervicalgia: Secondary | ICD-10-CM

## 2018-04-27 DIAGNOSIS — R41841 Cognitive communication deficit: Secondary | ICD-10-CM

## 2018-04-27 DIAGNOSIS — R2681 Unsteadiness on feet: Secondary | ICD-10-CM

## 2018-04-27 DIAGNOSIS — R2689 Other abnormalities of gait and mobility: Secondary | ICD-10-CM
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
## 2018-04-27 NOTE — Patient Instructions (Signed)
    Memory Notebook  Use a 3-ring notebook with sections for the following:  calendar, journal what you did each day, doctor questions, important names and phone numbers, medication doses and times, doctors' names/phone numbers, "to do list"/reminders.  You will need to bring this notebook to ALL THERAPY SESSIONS.   You will need to use this all the time, - this book WILL BE your memory!
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
## 2018-04-27 NOTE — Patient Instructions (Addendum)
Access Code: [email or web address]  URL: [email or web address]  Date: 04/27/2018  Prepared by: Misty Stanley   Exercises  Romberg Stance with Head Rotation - 10 reps - 2 sets - 2x daily - 5x weekly  Walking Tandem Stance - 10 reps - 3 sets - 1x daily - 7x weekly  Supine Suboccipital Release with Tennis Balls - 30 second hold - 2x daily - 7x weekly  Supine Chin Tuck - 3 reps - 1 sets - 30 seconds hold - 2-3x daily - 7x weekly  Seated Gentle Upper Trapezius Stretch - 3 reps - 1 sets - 30 seconds hold - 2-3x daily - 7x weekly

## 2018-04-30 ENCOUNTER — Telehealth: Payer: Self-pay

## 2018-04-30 NOTE — Telephone Encounter (Signed)
Called pt's sister to arrange a time and day for a patient care conference, but was unable to leave a message. "...is unavailable, please try your call again later." Unable to leave message.

## 2018-05-01 ENCOUNTER — Telehealth: Payer: Self-pay | Admitting: Physical Therapy

## 2018-05-01 NOTE — Telephone Encounter (Signed)
Hello Dr. Naaman Plummer, Physical therapy is nearing the end of our certification period with Bridget Anderson.  She has made some progress with her balance and dizziness but continues to report daily headaches.  Her progress has seemed to plateau with the biggest barriers to further progress are memory, decreased carry over at home with caregiver and headaches.  During multiple treatment sessions we have had to stop early or take longer rest breaks due to worsening of her headaches and her caregiver reports at home when performing the HEP, she often has to stop and take a nap due to a headache.    Her headaches seem to be multi-factorial - h/o migraines, BP and post concussive and I know she is being treated medically for all of these so I wanted to see if you had any other thoughts, suggestions or guidance for [place] regarding headache management?  We have done some manual therapy to her neck and suboccipital muscles which helps temporarily but does not seem to carry over from session to session.     PT/OT/ST are planning a family conference with Gal and her sister to discuss progress and the plan for therapy going forward.  I just wanted to be able to speak to the headaches at that conference.  Thank you so much for your help, Rico Junker, PT, DPT 05/01/18    1:44 PM

## 2018-05-01 NOTE — Telephone Encounter (Signed)
When we had Bridget Anderson on inpatient rehab, we had her blood pressure under good control and headaches were almost resolved.  Since she has been home, she has not been taking her medications as prescribed and her blood pressure has remained elevated as per my last office note.  They are trying to work on better adherence to her regimen,   There certainly is a migraine/postconcussion element to these headaches but given the fact that when we had her blood pressure tightly controlled they were almost resolved, tells me that these headaches probably were mostly driven by her excessively high blood pressures which have been that way for some time it appears.    Need to stress again with them the importance of blood pressure control.  This is a difficult case because what ever we have ordered, it has been difficult to achieve consistent follow-through.  The therapy with you all is probably the most consistent thing she has been able to do so far other than our inpatient rehab stay.

## 2018-05-02 ENCOUNTER — Ambulatory Visit: Payer: No Typology Code available for payment source | Admitting: Physical Therapy

## 2018-05-02 ENCOUNTER — Ambulatory Visit: Payer: No Typology Code available for payment source | Admitting: Occupational Therapy

## 2018-05-02 ENCOUNTER — Ambulatory Visit: Payer: No Typology Code available for payment source

## 2018-05-08 ENCOUNTER — Encounter: Payer: Self-pay | Admitting: Psychology

## 2018-05-08 ENCOUNTER — Other Ambulatory Visit: Payer: Self-pay

## 2018-05-08 ENCOUNTER — Encounter: Payer: Self-pay | Admitting: Physical Therapy

## 2018-05-08 ENCOUNTER — Ambulatory Visit: Payer: No Typology Code available for payment source | Admitting: Physical Therapy

## 2018-05-08 ENCOUNTER — Encounter (HOSPITAL_BASED_OUTPATIENT_CLINIC_OR_DEPARTMENT_OTHER): Payer: No Typology Code available for payment source | Admitting: Psychology

## 2018-05-08 ENCOUNTER — Ambulatory Visit: Payer: No Typology Code available for payment source | Attending: Internal Medicine

## 2018-05-08 ENCOUNTER — Ambulatory Visit: Payer: No Typology Code available for payment source | Admitting: Occupational Therapy

## 2018-05-08 VITALS — BP 164/100 | HR 60

## 2018-05-08 DIAGNOSIS — S069X0S Unspecified intracranial injury without loss of consciousness, sequela: Secondary | ICD-10-CM

## 2018-05-08 DIAGNOSIS — R41841 Cognitive communication deficit: Secondary | ICD-10-CM | POA: Diagnosis present

## 2018-05-08 DIAGNOSIS — F0781 Postconcussional syndrome: Secondary | ICD-10-CM

## 2018-05-08 DIAGNOSIS — R2681 Unsteadiness on feet: Secondary | ICD-10-CM

## 2018-05-08 DIAGNOSIS — R413 Other amnesia: Secondary | ICD-10-CM

## 2018-05-08 DIAGNOSIS — R2689 Other abnormalities of gait and mobility: Secondary | ICD-10-CM

## 2018-05-08 DIAGNOSIS — R4689 Other symptoms and signs involving appearance and behavior: Secondary | ICD-10-CM | POA: Diagnosis not present

## 2018-05-08 DIAGNOSIS — M542 Cervicalgia: Secondary | ICD-10-CM

## 2018-05-08 DIAGNOSIS — R42 Dizziness and giddiness: Secondary | ICD-10-CM

## 2018-05-08 DIAGNOSIS — R41844 Frontal lobe and executive function deficit: Secondary | ICD-10-CM

## 2018-05-08 DIAGNOSIS — G43711 Chronic migraine without aura, intractable, with status migrainosus: Secondary | ICD-10-CM

## 2018-05-08 DIAGNOSIS — R41840 Attention and concentration deficit: Secondary | ICD-10-CM

## 2018-05-08 NOTE — Therapy (Signed)
[facility] [address] [place], [place], [address] Phone: [phone]   Fax:  [phone]  Occupational Therapy Treatment  Patient Details  Name: Bridget Anderson MRN: [id number] Date of Birth: October 27, 1970 No data recorded  Encounter Date: 05/08/2018  OT End of Session - 05/08/18 1031    Visit Number  5    Number of Visits  16    Date for OT Re-Evaluation  06/03/18    Authorization Type  [employer]    OT Start Time  0930    OT Stop Time  1015    OT Time Calculation (min)  45 min    Activity Tolerance  Patient tolerated treatment well    Behavior During Therapy  [facility] for tasks assessed/performed       Past Medical History:  Diagnosis Date  . Chlamydia   . Diabetes ([place])   . GERD (gastroesophageal reflux disease)   . Hyperlipidemia   . Hypertension   . Migraine   . Obstructive sleep apnea on CPAP    pt has not worn CPAP in years  . Seasonal allergies   . Wears glasses     Past Surgical History:  Procedure Laterality Date  . ABDOMINAL HYSTERECTOMY N/A 07/16/2013   Procedure: HYSTERECTOMY ABDOMINAL;  Surgeon: Allyn Kenner, DO;  Location: [facility];  Service: Gynecology;  Laterality: N/A;  . BILATERAL SALPINGECTOMY Bilateral 07/16/2013   Procedure: BILATERAL SALPINGECTOMY;  Surgeon: Allyn Kenner, DO;  Location: [facility];  Service: Gynecology;  Laterality: Bilateral;  . CARPAL TUNNEL RELEASE  01/16/2012   Procedure: CARPAL TUNNEL RELEASE;  Surgeon: Nita Sells, MD;  Location: [facility];  Service: Orthopedics;  Laterality: Left;  . CARPAL TUNNEL RELEASE  02/20/2012   Procedure: CARPAL TUNNEL RELEASE;  Surgeon: Nita Sells, MD;  Location: [facility];  Service: Orthopedics;  Laterality: Right;  . COLONOSCOPY    . CYSTO WITH HYDRODISTENSION N/A 07/16/2013   Procedure: CYSTOSCOPY/HYDRODISTENSION;  Surgeon: Reece Packer, MD;  Location: [facility];  Service:  Urology;  Laterality: N/A;  . DILATION AND CURETTAGE OF UTERUS    . FOOT SURGERY    . HYSTEROSCOPY W/D&C  10/31/2011   Procedure: DILATATION AND CURETTAGE /HYSTEROSCOPY;  Surgeon: Allyn Kenner, DO;  Location: [facility];  Service: Gynecology;  Laterality: N/A;  . LAPAROSCOPY N/A 07/16/2013   Procedure: LAPAROSCOPY DIAGNOSTIC;  Surgeon: Allyn Kenner, DO;  Location: [facility];  Service: Gynecology;  Laterality: N/A;  . PLANTAR FASCIA SURGERY    . PUBOVAGINAL SLING N/A 07/16/2013   Procedure: Gaynelle Arabian;  Surgeon: Reece Packer, MD;  Location: [facility];  Service: Urology;  Laterality: N/A;  . TUBAL LIGATION    . [place],  2003    There were no vitals filed for this visit.  Subjective Assessment - 05/08/18 0943    Subjective   I have a splint. 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 PMH: HTN, DM, migraines    Limitations  decreased memory and awareness into deficits    Currently in Pain?  Yes    Pain Score  3     Pain Location  Head    Pain Descriptors / Indicators  Headache    Pain Type  Chronic pain    Pain Onset  More than a month ago    Pain Frequency  Constant    Aggravating Factors   moving  Pain Relieving Factors  meds                   OT Treatments/Exercises (OP) - 05/08/18 0001      ADLs   Cooking  Pt asked to cook grilled cheese - pt forgot what she was supposed to cook when going to refrigerator to get items and needed questioning cues to remember. Pt cooked grilled cheese by gathering items needed and sequencing well, but needed cues to turn on vent and adjust stove due to burning.     ADL Comments  BP = 179/104. Letter sent to family re: concerns w/ BP, carryover of recommendations and requesting someone attend sessions w/ her, and family conference. Discussed w/ pt use of memory notebook and recommended a "reminder" or "to do list" tab               OT Short Term Goals - 05/08/18 1032       OT [place] #1   Title  Independent with HEP for LUE strength and grip strength - due 05/03/18    Time  4    Period  Weeks    Status  Achieved   But requires assist to remember to do it     OT SHORT TERM GOAL #2   Title  Pt to verbalize understanding with memory strategies for functional tasks     Time  4    Period  Weeks    Status  Achieved   But assist needed for carryover     OT SHORT TERM GOAL #3   Title  Pt to report dizziness less than 5/10 with functional tasks (loading/unloading dishwasher, laundry tasks, etc)     Time  4    Period  Weeks    Status  Achieved      OT SHORT TERM GOAL #4   Title  Pt to perform simple familiar cooking task w/ no more than min cueing    Time  4    Period  Weeks    Status  Achieved   min cueing needed     OT SHORT TERM GOAL #5   Title  Pt to attend to cognitive task for 10 min. in quiet environment in prep for meal planning/money management    Time  4    Period  Weeks    Status  On-going        OT Long Term Goals - 04/04/18 1440      OT LONG TERM GOAL #1   Title  Pt to improve grip strength Lt hand to 35 lbs or greater - due 06/03/18    Baseline  eval = 18 lbs, reeval on 04/04/18 = 25 lbs    Time  8    Period  Weeks    Status  Revised      OT LONG TERM GOAL #2   Title  Pt to perform simple novel cooking task with min cues only    Time  8    Period  Weeks    Status  New      OT LONG TERM GOAL #3   Title  Pt to attend to simple cognitve task (meal planning, basic money management) in moderately distracting enviornment for 15 min. w/o rest    Time  8    Period  Weeks    Status  New      OT LONG TERM GOAL #4   Title  Pt to demo sufficient working  memory for simple organizational/problem solving task    Time  8    Period  Weeks    Status  New      OT LONG TERM GOAL #5   Title  Pt to perform environmental scanning in busy environment w/ 90% accuracy    Time  8    Period  Weeks    Status  New            Plan  - 05/08/18 1033    Clinical Impression Statement  Pt progressing w/ goals but requires assist and carryover at home d/t memory deficits    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, HTN, DM, migraines    Occupational performance deficits (Please refer to evaluation for details):  IADL's;Work;Leisure;Social Participation    Rehab Potential  Fair    OT Frequency  2x / week    OT Duration  8 weeks    OT Treatment/Interventions  Self-care/ADL training;Therapeutic exercise;Visual/perceptual remediation/compensation;Coping strategies training;Patient/family education;Therapist, nutritional;Therapeutic activities;DME and/or AE instruction;Cognitive remediation/compensation    Plan  continue ADLS, continue to monitor pain Lt elbow and strength for LUE and hand    Consulted and Agree with Plan of Care  Patient       Patient will benefit from skilled therapeutic intervention in order to improve the following deficits and impairments:     Visit Diagnosis: Unsteadiness on feet  Attention and concentration deficit  Frontal lobe and executive function deficit    Problem List Patient Active Problem List   Diagnosis Date Noted  . Anterograde amnesia   . Postconcussive syndrome 01/01/2018  . Asthma   . Essential hypertension   . Diabetes mellitus type 2 in obese ([place])   . Dyslipidemia   . Vascular headache   . Chronic post-traumatic headache, not intractable 11/20/2017  . Shortness of breath on exertion 05/25/2017  . Other fatigue 05/25/2017  . Type 2 diabetes mellitus without complication, without long-term current use of insulin ([place]) 05/25/2017  . Uncontrolled hypertension 05/25/2017  . Chronic migraine without aura, with intractable migraine, so stated, with status migrainosus 03/20/2017  . S/P hysterectomy 07/16/2013    Carey Bullocks, OTR/L 05/08/2018, 10:35 AM  [facility] [address] [place], [place], [address] Phone: [phone]   Fax:  [phone]  Name: Bridget Anderson MRN: [id number] Date of Birth: 1970/05/02

## 2018-05-08 NOTE — Patient Instructions (Signed)

## 2018-05-08 NOTE — Therapy (Addendum)
[facility] [address] [place], [place], [address] Phone: [phone]   Fax:  [phone]  Speech Language Pathology Treatment  Patient Details  Name: Bridget Anderson MRN: [id number] Date of Birth: [birth year] Referring Provider (SLP): Alger Simons, MD   Encounter Date: 05/08/2018    End of Session - 04/04/18 1516    Visit Number  10    Number of Visits  25    Date for SLP Re-Evaluation  06/15/18    Authorization - Number of Visits  25    SLP Start Time  0854    SLP Stop Time   0934   SLP Time Calculation (min)  40 min    Activity Tolerance  Patient tolerated treatment well      Past Medical History:  Diagnosis Date  . Chlamydia   . Diabetes ([place])   . GERD (gastroesophageal reflux disease)   . Hyperlipidemia   . Hypertension   . Migraine   . Obstructive sleep apnea on CPAP    pt has not worn CPAP in years  . Seasonal allergies   . Wears glasses     Past Surgical History:  Procedure Laterality Date  . ABDOMINAL HYSTERECTOMY N/A 07/16/2013   Procedure: HYSTERECTOMY ABDOMINAL;  Surgeon: Allyn Kenner, DO;  Location: [facility];  Service: Gynecology;  Laterality: N/A;  . BILATERAL SALPINGECTOMY Bilateral 07/16/2013   Procedure: BILATERAL SALPINGECTOMY;  Surgeon: Allyn Kenner, DO;  Location: [facility];  Service: Gynecology;  Laterality: Bilateral;  . CARPAL TUNNEL RELEASE  01/16/2012   Procedure: CARPAL TUNNEL RELEASE;  Surgeon: Nita Sells, MD;  Location: [facility];  Service: Orthopedics;  Laterality: Left;  . CARPAL TUNNEL RELEASE  02/20/2012   Procedure: CARPAL TUNNEL RELEASE;  Surgeon: Nita Sells, MD;  Location: [facility];  Service: Orthopedics;  Laterality: Right;  . COLONOSCOPY    . CYSTO WITH HYDRODISTENSION N/A 07/16/2013   Procedure: CYSTOSCOPY/HYDRODISTENSION;  Surgeon: Reece Packer, MD;  Location: [facility];  Service: Urology;   Laterality: N/A;  . DILATION AND CURETTAGE OF UTERUS    . FOOT SURGERY    . HYSTEROSCOPY W/D&C  10/31/2011   Procedure: DILATATION AND CURETTAGE /HYSTEROSCOPY;  Surgeon: Allyn Kenner, DO;  Location: [facility];  Service: Gynecology;  Laterality: N/A;  . LAPAROSCOPY N/A 07/16/2013   Procedure: LAPAROSCOPY DIAGNOSTIC;  Surgeon: Allyn Kenner, DO;  Location: [facility];  Service: Gynecology;  Laterality: N/A;  . PLANTAR FASCIA SURGERY    . PUBOVAGINAL SLING N/A 07/16/2013   Procedure: Gaynelle Arabian;  Surgeon: Reece Packer, MD;  Location: [facility];  Service: Urology;  Laterality: N/A;  . TUBAL LIGATION    . [place],  2003    There were no vitals filed for this visit.  Subjective Assessment - 05/08/18 0905    Subjective  "This is the book I keep to help me - - try to remember. I can't remember."    Currently in Pain?  Yes    Pain Score  5     Pain Location  Head    Pain Orientation  Right;Posterior    Pain Descriptors / Indicators  Headache    Pain Type  Chronic pain    Pain Onset  More than a month ago    Pain Frequency  Constant    Aggravating Factors   moving    Pain Relieving Factors  meds  ADULT SLP TREATMENT - 05/08/18 0906      General Information   Behavior/Cognition  Alert;Cooperative;Pleasant mood;Requires cueing;Distractible;Decreased sustained attention      Treatment Provided   Treatment provided  Cognitive-Linquistic      Cognitive-Linquistic Treatment   Treatment focused on  Cognition    Skilled Treatment  "You look familiar to me, like I've seen you before." When SLP told pt that this was the Pt looked in notebook spontaneously to tell SLP details of what she did yesterday.          SLP Short Term Goals - 05/08/18 1311      SLP SHORT TERM GOAL #1   Title  pt will demo use of memory enhancing system outside of ST session (i.e., writing down items/entries in a memory journal) between 4 sessions to enhance or provide a means of  functional memory for pt    Baseline  04-10-18, 04-25-18    Status  Partially Met   from 8 sessions to 4     SLP [place] #2   Title  pt will tell SLP 3 practical ramifications of her cognitive deficits with modified independence (i.e., written notes) in 4 sessions    Status  Not Met      SLP SHORT TERM GOAL #3   Title  pt will give adequate synopsis of salient written material she enjoys with modified independence over two sessions    Status  Deferred   to work on memory compensations for functional pt tasks      SLP Long Term Goals - 05/08/18 [place] #1   Title  pt will demo appropriate usage of a memory enhancement/compensation system between 10 ST sessions    Baseline  04-10-18, 04-25-18, 05-08-18    Time  6    Period  Weeks    Status  On-going       Plan - 05/08/18 1309    Clinical Impression Statement   Pt presents with cont'd profound memory deficits (memory encoding) - pt demo'd sustained memory of no more than ~ 1-3 minutes. Very little, if any, change has been seen in pt's memory skills to date however pt is demonstrating more consistent and organzied use of her planner/3-ring binder today, taking notes on today's ST session in her "apppointments" section. SLP wrote pt a note encouraging her to put in tabs for each therapy. See skilled intervention for further details about today's visit. Aide, Ebony did not attend the session with pt today - she was on the phone as SLP took pt back to tx. SLP would like for pt to be occasional min A to modified independent with a 3-ring memory binder in order to d/c. Pt was asked to be scheduled x2/week but she told scheduler she prefers once a week because two times a week was "too much." Will address with pt next visit.    Speech Therapy Frequency  2x / week    Duration  --   12 weeks/25 sessions   Treatment/Interventions  Compensatory strategies;Patient/family education;Functional tasks;Cueing hierarchy;Cognitive  reorganization;Environmental controls;Internal/external aids;SLP instruction and feedback    Potential to Achieve Goals  Fair    Potential Considerations  Cooperation/participation level;Severity of impairments;Ability to learn/carryover information       Patient will benefit from skilled therapeutic intervention in order to improve the following deficits and impairments:   Cognitive communication deficit    Problem List Patient Active Problem List   Diagnosis  Date Noted  . Anterograde amnesia   . Postconcussive syndrome 01/01/2018  . Asthma   . Essential hypertension   . Diabetes mellitus type 2 in obese ([place])   . Dyslipidemia   . Vascular headache   . Chronic post-traumatic headache, not intractable 11/20/2017  . Shortness of breath on exertion 05/25/2017  . Other fatigue 05/25/2017  . Type 2 diabetes mellitus without complication, without long-term current use of insulin ([place]) 05/25/2017  . Uncontrolled hypertension 05/25/2017  . Chronic migraine without aura, with intractable migraine, so stated, with status migrainosus 03/20/2017  . S/P hysterectomy 07/16/2013    [facility] ,[place], CCC-SLP  05/08/2018, 1:13 PM  [facility] [address] [place], [place], [address] Phone: [phone]   Fax:  [phone]   Name: Bridget Anderson MRN: [id number] Date of Birth: [birth year]

## 2018-05-08 NOTE — Therapy (Signed)
[facility] [address] [place], [place], [address] Phone: [phone]   Fax:  [phone]  Physical Therapy Treatment  Patient Details  Name: Bridget Anderson MRN: [id number] Date of Birth: 1970-05-21 Referring Provider (PT): Dr. Oval Linsey   Encounter Date: 05/08/2018  PT End of Session - 05/08/18 1118    Visit Number  11    Number of Visits  17   Date for PT Re-Evaluation  06/07/18    Authorization Type  Workers Comp    PT Start Time  1020    PT Stop Time  1105    PT Time Calculation (min)  45 min    Activity Tolerance  Patient tolerated treatment well    Behavior During Therapy  [facility] for tasks assessed/performed   unable to remember items during therapy      Past Medical History:  Diagnosis Date  . Chlamydia   . Diabetes ([place])   . GERD (gastroesophageal reflux disease)   . Hyperlipidemia   . Hypertension   . Migraine   . Obstructive sleep apnea on CPAP    pt has not worn CPAP in years  . Seasonal allergies   . Wears glasses     Past Surgical History:  Procedure Laterality Date  . ABDOMINAL HYSTERECTOMY N/A 07/16/2013   Procedure: HYSTERECTOMY ABDOMINAL;  Surgeon: Allyn Kenner, DO;  Location: [facility];  Service: Gynecology;  Laterality: N/A;  . BILATERAL SALPINGECTOMY Bilateral 07/16/2013   Procedure: BILATERAL SALPINGECTOMY;  Surgeon: Allyn Kenner, DO;  Location: [facility];  Service: Gynecology;  Laterality: Bilateral;  . CARPAL TUNNEL RELEASE  01/16/2012   Procedure: CARPAL TUNNEL RELEASE;  Surgeon: Nita Sells, MD;  Location: [facility];  Service: Orthopedics;  Laterality: Left;  . CARPAL TUNNEL RELEASE  02/20/2012   Procedure: CARPAL TUNNEL RELEASE;  Surgeon: Nita Sells, MD;  Location: [facility];  Service: Orthopedics;  Laterality: Right;  . COLONOSCOPY    . CYSTO WITH HYDRODISTENSION N/A 07/16/2013   Procedure: CYSTOSCOPY/HYDRODISTENSION;   Surgeon: Reece Packer, MD;  Location: [facility];  Service: Urology;  Laterality: N/A;  . DILATION AND CURETTAGE OF UTERUS    . FOOT SURGERY    . HYSTEROSCOPY W/D&C  10/31/2011   Procedure: DILATATION AND CURETTAGE /HYSTEROSCOPY;  Surgeon: Allyn Kenner, DO;  Location: [facility];  Service: Gynecology;  Laterality: N/A;  . LAPAROSCOPY N/A 07/16/2013   Procedure: LAPAROSCOPY DIAGNOSTIC;  Surgeon: Allyn Kenner, DO;  Location: [facility];  Service: Gynecology;  Laterality: N/A;  . PLANTAR FASCIA SURGERY    . PUBOVAGINAL SLING N/A 07/16/2013   Procedure: Gaynelle Arabian;  Surgeon: Reece Packer, MD;  Location: [facility];  Service: Urology;  Laterality: N/A;  . TUBAL LIGATION    . [place],  2003    Vitals:   05/08/18 1027 05/08/18 1045  BP: (!) 179/108 (!) 164/100  Pulse: 60     Subjective Assessment - 05/08/18 1027    Subjective  "I have a headache every day."  Less severe today but pt feeling tired today.  Caregiver reports they are doing the exercises 3x/week, 2-3 times a day.  Caregiver thinks that speech therapist was going to order them a 3 ring binder.    Patient is accompained by:  --   caregiver, Charlena Cross (with pt Tues-Friday)   Pertinent History  Pt hit by 85# pipe at work June 2019, post-concussive syndrome,memory deficits    Patient Stated Goals  Pt's  goal is to stop hurting (02/05/18) (regarding headaches)    Currently in Pain?  Yes    Pain Score  4     Pain Location  Head    Pain Onset  More than a month ago         [facility] PT Assessment - 05/08/18 1054      Ambulation/Gait   Gait velocity  32.8/10.16=3.24 f/tsec      Dynamic Gait Index   Level Surface  Mild Impairment    Change in Gait Speed  Normal    Gait with Horizontal Head Turns  Mild Impairment    Gait with Vertical Head Turns  Moderate Impairment    Gait and Pivot Turn  Normal    Step Over Obstacle  Mild Impairment    Step Around Obstacles  Normal    Steps  Mild Impairment    Total Score  18     DGI comment:  <19/24 indicates incr fall risk      Timed Up and Go Test   Normal TUG (seconds)  10.84    Cognitive TUG (seconds)  28.91   subtracting by 3's; 19.47 subtracting by 1's    TUG Comments  166% increase; 80% increase       Self-care- Assessed BP at beginning of session (remains elevated, but did come down as pt ?relaxed while discussing plan for session). Discussed pt's plan for assuring she takes her BP meds at home and pt (and later aide) report she has a med box with a timer that goes off when time to take her medicine. The timer will not stop until she has opened the box to take the pills. Aide states she or family fills the box for her and aide feels pt is taking meds as prescribed. Letter sent home to sister (via aide) re: need to work with MD to adjust meds for better BP control, with hope that this will improve pt's headaches (and therefore tolerance for HEP).   Discussed with pt and aide desire to have a family conference with pt's sister or mother (daughter could also attend) and PT, OT, and SLP therapists. Goal is to further educate family on pt's status and how to use her memory notebook (including the section with her HEP).                       PT Short Term Goals - 04/05/18 1334      PT SHORT TERM GOAL #1   Title  = LTG        PT Long Term Goals - 05/08/18 1120      PT LONG TERM GOAL #1   Title  Pt will demonstrate compliance with vestibular and balance HEP with supervision and guidance from family or aide (Target all LTGs moved to 05/08/18 due to missed week)    Baseline  05/08/18 Headaches continue to limit pt's ability to tolerate/participate in HEP. Aide estimates they do full program 3x/week.     Time  4    Period  Weeks    Status  Partially Met      PT LONG TERM GOAL #2   Title  Pt will consistently report HA </= 4/10 during therapy and while performing HEP    Baseline  05/08/18 began at 4/10, incr to 5/10 during session  (consistently >4/10 during PT)    Time  4    Period  Weeks    Status  Not Met  PT LONG TERM GOAL #3   Title  patient to improve DGI to at least 19/24 for demonstrating reduced fall risk    Baseline  same as before 14/24; 05/08/18 18/24    Time  4    Period  Weeks    Status  Partially Met      PT LONG TERM GOAL #4   Title  Pt will improve gait velocity to >/= 3.6 ft/sec without AD    Baseline  3.0 ft/sec no AD; 05/08/18 3.24 ft/sec    Time  4    Period  Weeks    Status  Partially Met      PT LONG TERM GOAL #5   Title  Pt will demonstrate </= 25% difference between TUG and COG TUG    Baseline  50% difference; 05/08/18 counting backwards by 1's 80% difference; by 3's 166% increase    Time  4    Period  Weeks    Status  Not Met       NEW LONG TERM GOALS  PT Long Term Goals - 05/08/18 1147      PT LONG TERM GOAL #1   Title  Pt will demonstrate compliance with vestibular and balance HEP with supervision and guidance from family or aide (Target all LTGs: 4 weeks, 06/07/2018)    Baseline  05/08/18 Headaches continue to limit pt's ability to tolerate/participate in HEP. Aide estimates they do full program 3x/week.     Status  On-going      PT LONG TERM GOAL #2   Title  Pt will consistently report HA </= 4/10 during therapy and while performing HEP    Baseline  05/08/18 began at 4/10, incr to 5/10 during session (consistently >4/10 during PT)    Status  On-going      PT LONG TERM GOAL #3   Title  patient to improve DGI to at least 19/24 for demonstrating reduced fall risk    Baseline  same as before 14/24; 05/08/18 18/24    Status  On-going      PT LONG TERM GOAL #4   Title  Pt will improve gait velocity to >/= 3.5 ft/sec without AD (norm for age, gender)    Baseline  3.0 ft/sec no AD; 05/08/18 3.24 ft/sec    Status  On-going           Plan - 05/08/18 1128    Clinical Impression Statement  LTGs assessed this date with pt partially meeting 3 of 5 goals (improved but not to  goal level) and 2 of 5 goals were not met (no progress made towards goals). All progress has been slowed due to persistent headaches, elevated BP, and difficulty with carryover with HEP due to pt's headaches and becomes irritable with aide and does not want to do HEP. ?communication to family members by aide re: skills pt needs to practice at home. PT has been in contact with Dr. Naaman Plummer re: headache management and he feels her BP needs to be more tightly controlled which should reduce headaches. Today, aide reported that  pt has a pill box with an alarm to remind her to take her medicines and she feels confident that pt is taking her BP meds as prescribed. Based on slow progress and plan for family conference at the end of this month, will re-certify for an additional 4 weeks at 1x/week for further education on HEP for pt to continue to improve her balance and dizziness.     Rehab  Potential  Fair    Clinical Impairments Affecting Rehab Potential  Severity of (cognitive) deficits following injury; family involvement (sister working, has possible hired caregiver for home)    PT Frequency  1x / week    PT Duration  4 weeks    PT Treatment/Interventions  ADLs/Self Care Home Management;Gait training;Functional mobility training;Therapeutic activities;Therapeutic exercise;Patient/family education;Neuromuscular re-education;Balance training;Canalith Repostioning;Visual/perceptual remediation/compensation;Vestibular;Stair training;Cognitive remediation;Manual techniques;Passive range of motion;Dry needling    PT Next Visit Plan  CHECK BP; how are tennis ball manual release going??  Continue to progress corner balance and balance while ambulating.  Simple dual task activities during gait.  Standing balance and gait with head turns to patient tolerance.  Obstacle negotiation and safety in community.  Gait outside.     PT Home Exercise Plan  [id number]    Consulted and Agree with Plan of Care  Patient    Family Member  Consulted  [place] - aide (available at end of session to discuss)       Patient will benefit from skilled therapeutic intervention in order to improve the following deficits and impairments:  Abnormal gait, Decreased balance, Decreased safety awareness, Decreased mobility, Difficulty walking, Postural dysfunction, Dizziness, Decreased activity tolerance, Pain  Visit Diagnosis: Unsteadiness on feet  Cervicalgia  Other abnormalities of gait and mobility  Dizziness and giddiness     Problem List Patient Active Problem List   Diagnosis Date Noted  . Anterograde amnesia   . Postconcussive syndrome 01/01/2018  . Asthma   . Essential hypertension   . Diabetes mellitus type 2 in obese ([place])   . Dyslipidemia   . Vascular headache   . Chronic post-traumatic headache, not intractable 11/20/2017  . Shortness of breath on exertion 05/25/2017  . Other fatigue 05/25/2017  . Type 2 diabetes mellitus without complication, without long-term current use of insulin ([place]) 05/25/2017  . Uncontrolled hypertension 05/25/2017  . Chronic migraine without aura, with intractable migraine, so stated, with status migrainosus 03/20/2017  . S/P hysterectomy 07/16/2013    Rexanne Mano , PT 05/08/2018, 11:45 AM  [facility] [address] [place], [place], [address] Phone: [phone]   Fax:  [phone]  Name: ITZAYANA PARDY MRN: [id number] Date of Birth: Sep 06, 1970

## 2018-05-08 NOTE — Progress Notes (Signed)
Neuropsychological Evaluation   Patient:  Bridget Anderson   DOB: 01-25-1971  MR Number: [id number]  Location: [facility] PHYSICAL MEDICINE AND REHABILITATION [place], [id number] [place] [address] Dept: [phone]  Start: 9 AM End: 10 AM  Provider/Observer:     Edgardo Roys PsyD  Chief Complaint:      Chief Complaint  Patient presents with   Memory Loss   Agitation    Reason For Service:      Bridget Anderson a 48 year old right-handed female with a history of hypertension, migraine headaches, and diabetes.  The patient was involved in a work-related accident on 08/05/2017 when a 85 pound pipe fell and struck the patient on the head.  The patient reports that she has been told she was standing and cleaning machinery at work when the pipe fell and hit her in the head.  The patient reports that she does have memory and recall for most of what happened prior to being struck on the head.  The patient reports that she remembers feeling the pipe hit her and that she could "feel it in her teeth."  During the interview while she was on the inpatient unit the patient reported that she initially could not see or hear anything but oriented to self leaning over the pipe and was an area by herself.  The patient reports that she called someone on her cell phone to come and assist.  The patient reports that she does not think she had a full loss of consciousness but there was an altered consciousness.  When team members came down they ended up driving her to the hospital.  The patient reports that she remembers being in a great deal of pain and remembers the events in the emergency department and that she was seen in the emergency department for a long time.  The patient reports that she remembers the night at home after it happened quite clearly but when she woke up in the morning everything is far as her mood and  remembering new events that her memory and recall was fuzzy for any new event.  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  The patient and her sister reports that the patient is remembering all the events although having some delayed speed in her recall.  There also some minor word finding issues noted.  During her emergency department presentation at [facility] she was complaining of nausea and vertigo with headache. She acknowledged that she did not recall the full events of the accident. CT scan of the head was reviewed from that time and was unremarkable for intracranial process. She was treated for a scalp contusion and then released. There was only transient loss of vision and hearing and no complete loss of consciousness. The patient again presented to the emergency department on 08/08/2017 with increased headache, nausea, dizziness and blurred vision. She was also experiencing ringing in her ears. The patient has continued to report persistent headaches as well as bouts of memory loss. The patient describes her memory difficulties related to any new event. She reports that she will meet with 1 of her doctors and be able to follow along and remember the conversation while it is active. However, once the interaction with others have been completed she quickly loses memory of what is been happening or what has transpired. The patient has trouble  remembering any new event but is well oriented and with good executive functioning during the event itself.  The patient [facility] The impressions of this MRI showed no acute findings and there were no indications of acute or remote evidence of intracranial hemorrhage.  Now that the patient is out of the inpatient unit she is desperately trying to get back to work and is having difficulty acknowledging the  degree of her memory deficits.  The patient reports that she is having headaches all of the time now and her sister and patient acknowledge a profound memory deficit for new events.  There are also some changes in personality and temperament noted.  Testing Administered:  The patient was administered the Wechsler Adult Intelligence Scale-IV as well as the Wechsler memory scale-IV.  She was also administered the TOMM test.  Participation Level:   Active  Participation Quality:  Redirectable      Behavioral Observation:  Well Groomed, Confused, and Constricted.   The neuropsychological battery being interpreted below were administered by Dr. Darol Destine and the behavioral observations that he produced during these administrations are also provided below.  BEHAVIOR OBSERVATIONS: Patient was around 10 minutes late to her 13:00pm testing appointment. She was administered the Wechsler Memory Scale, 4th Edition, Adult Battery, which lasted 150 minutes. She was mostly oriented but gave incorrect day of week and month (e.g. stated day as Thursday the 17th). She was appropriately dressed and well groomed. Effort was variable and she required frequent redirection. She became significantly frustrated on both immediate and delayed recall trials of the WMS-IV and performed poorly on recognition tasks. She expressed desire to quite from the onset of testing but decided to complete at least one battery of tests.  Mood appeared anxious and depressed. Affect was appropriate and congruent with mood. Thought processes appeared disorganized and goal directed, with concrete thinking noted. Next testing session (e.g. complete WAIS-IV, TOMM, etc.) will be scheduled some time next week after coordinating with patients sister.   BEHAVIOR OBSERVATIONS: Patient arrived on time to her 10:00am testing appointment. She was administered the Wechsler Adult Intelligence Scale, 4th Edition and Test of Memory Malingering, which lasted 180  minutes. She was appropriately dressed and well groomed. She was cooperative with most assigned tasks but gave variable effort (see results of TOMM below). Mood appeared more bright than previous testing session (04/13/2018) but still low overall. Affect was mostly appropriate and congruent with mood. Thought processes appeared disorganized and goal directed, with concrete thinking noted. She denied having any memory of the previous testing session on 04/13/2018 and claimed she did not know/had never seen this examiner before. She also denied any knowledge of the reason for the current visit.   Of note, she received credit for 5-6 items on the WAIS-IV: Matrix Reasoning that were selected at random (e.g. pt. expressed not knowing the answer and taking wild guess), which inflated her score. Thus, results of this task may represent an over estimate of her true perceptual reasoning ability.   Test Results:    Composite Score Summary  Scale Sum of Scaled Scores Composite Score Percentile Rank 95% Conf. Interval Qualitative Description  Verbal Comprehension 25 VCI 91 27 86-97 Average  Perceptual Reasoning 33 PRI 105 63 99-111 Average  Working Memory 9 WMI 69 2 64-78 Extremely Low  Processing Speed 16 PSI 89 23 82-98 Low Average  Full Scale 83 FSIQ 88 21 84-92 Low Average  General Ability  58 GAI 98 45 93-103 Average   The patient produced a full scale IQ score, which is a composite score of all of the individual composite scores that make up this test battery, 88 which is at the 21st percentile in the low average range of functioning overall.  The patient's global abilities index score, which is also a composite score which does not include measures of working memory and processing speed and thus does not place heavy emphasis on the most sensitive measures to postconcussion syndrome but gives a better overall analysis of potential premorbid functioning, was 98 which falls at the 45th percentile and is  in the average range.  The patient is verbal comprehension index score was 91 falling at the 27th percentile and in the average range.  Perceptual reasoning index score will 105, falls at the 63rd percentile and is in the average range but may be slightly elevated due to the patient successfully gassing on a number of items as noted above in the behavioral observations.  The patient is working memory index score was 69 which falls at the 2nd percentile and is in the extremely low range.  Processing speed index score was 89 which falls at the 23rd percentile and is in the low average range.  Overall, the patient had a best performance with regard to perceptual reasoning which may be slightly elevated due to successful gases during administration and she also did generally well with regard to her verbal comprehension scores.  The patient has significant deficits with regard to her working memory/auditory encoding abilities and to a lesser extent her overall information processing speed.  Verbal Comprehension Subtests Summary  Subtest Raw Score Scaled Score Percentile Rank Reference Group Scaled Score SEM  Similarities 24 9 37 10 1.04  Vocabulary 32 8 25 9  0.73  Information 10 8 25 8  0.73  (Comprehension) 25 10 50 11 1.16    The patient's verbal comprehension index score was 91 which fell at the 27th percentile and was in the average range.  There was no significant variability within subtest performance and items making up this index score and the patient produced subtest in the average to lower end of the average range across all measures.  The patient showed average performance with regard to her verbal reasoning abilities, her basic vocabulary knowledge, her general fund of information, as well as her social judgment and comprehension subtests.   Perceptual Reasoning Subtests Summary  Subtest Raw Score Scaled Score Percentile Rank Reference Group Scaled Score SEM  Block Design 32 8 25 7  0.95  Matrix  Reasoning 22 14 91 13 0.95  Visual Puzzles 15 11 63 10 0.85  (Figure Weights) 16 12 75 10 0.90  (Picture Completion) 11 9 37 8 1.24   The patient produced a perceptual reasoning index score of 105 which fell at the 63rd percentile and is in the average range.  While 1 of her subtest may be slightly elevated due to successful gassing on a number of items when the patient was particularly frustrated knowledge that she had no idea what the answer was.  However, her performances were all generally in the average range.  Her best performance was on items related to visual reasoning and problem-solving areas and her one significant elevated item was not that high above other visual reasoning problem solving subtest.  The patient showed average performance with regard to visual spatial and visual analysis abilities as well as the ability to identify visual anomalies within a visual  gestalt.  Overall, her perceptual reasoning abilities and visual-spatial abilities appear to be performing in the average to upper end of the average range.   Working Doctor, general practice Raw Score Scaled Score Percentile Rank Reference Group Scaled Score SEM  Digit Span 16 4 2 4  0.73  Arithmetic 8 5 5 5  0.90  (Letter-Number Seq.) 19 9 37 9 1.04   The patient produced a working memory index score of 69 which is at the 2nd percentile and is in the extremely low range.  There was significant variability within subtest performance.  The patient produced significantly impaired performance with regard to simple auditory encoding measures and encoding measures that also required processing of information.  The patient performed in the average range on a more demanding auditory encoding/processing measure.  There does appear to be significant variability within subtest performance and the patient did better on the more demanding test.  Processing Speed Subtests Summary  Subtest Raw Score Scaled Score Percentile Rank  Reference Group Scaled Score SEM  Symbol Search 20 6 9 5  1.56  Coding 63 10 50 8 1.20  (Cancellation) 34 9 37 8 1.62   The patient produced a processing speed index score of 89 which falls at the 23rd percentile and is in the low average range.  Individual items making up this measure also show considerable variability within subtest performance.  For example, the patient's most significant impairments were identified on the symbol search subtest where she produced a scaled score of 6 which is in the 9th percentile and is indicative of significant impairments relative to her normative comparison group.  This subtest requires horizontal visual scanning to complete.  On the coding subtest the patient performed in the average range as well as the cancellation measure.  The coding subtest requires vertical scanning throughout where is the cancellation test requires horizontal scanning.  2 of the 3 subtest were in the average range and were consistent with each other where is one measure showed moderate to significant impairments with regard to visual scanning and searching abilities and overall speed of mental operations.   ABILITY-MEMORY ANALYSIS  Ability Score:  GAI: 98 Date of Testing:  WAIS-IV; WMS-IV 2018/04/13  Predicted Difference Method   Index Predicted WMS-IV Index Score Actual WMS-IV Index Score Difference Critical Value  Significant Difference Y/N Base Rate  Auditory Memory 99 69 30 9.38 Y 1%  Visual Memory 99 85 14 9.36 Y 10-15%  Visual Working Memory 99 80 19 11.05 Y 5%  Immediate Memory 99 80 19 10.29 Y 5%  Delayed Memory 99 67 32 10.13 Y <1%    The patient was also administered the Wechsler Memory Scale-IV.  To provide an estimate of premorbid functions to allow for a predictive model to be used in analysis of her current memory functions we utilize the global abilities index score from the Wechsler Adult Intelligence Scale.  This measure puts less weight on sensitive  measures such as auditory working memory and overall information processing speed and puts more weight on items that are less likely to change after event such as a post concussive event.  Her global abilities index score was 98 and this measure was used to predict where she should be performing on the various memory indices.  The patient produced a visual working memory index score of 80 which falls at the ninth percentile and is in the low average range.  Utilizing her global abilities index score to produce a predicted score  of 99 resulted in a 19 point difference between predicted levels of performance and actual achieve levels.  This is statistically significant differences in indicative of impaired performance with regard to visual working memory.  When comparing her auditory working memory from the [employer] Adult Intelligence Scale the patient produced a 69 index score on that measure with the pattern suggesting that the patient is having greater difficulties with regard to auditory working memory/auditory encoding versus visual working memory/visual encoding scores.  The patient produced an immediate memory index score of 80 which falls at the 9th percentile and is in the low average range.  Utilizing her global abilities index score to produce a predictive score of 99 this resulted in a 19 point difference between predicted levels of performance and actually achieve levels.  This is a statistically significant difference and is indicative of impaired immediate memory.  The patient produced an auditory memory index score of 69 which falls at the 2nd percentile and is in the extremely low range.  This is a 30 point difference between predicted levels and achieve levels and is indicative of significant impairments with regard to auditory memory.  The patient produced a visual memory index score of 85 which is a 14 point difference and while indicative of impairments with regard to visual memory her  auditory memory is significantly more impaired.  This pattern of auditory attention and memory being more impaired than visual attention and memory is consistent with what was found on auditory versus visual encoding abilities.  The patient produced a delayed memory index score of 67 which is in the 1st percentile in the extremely low range.  This is indicative of significant impairments in delayed memory and in fact her auditory delayed memory was much more impaired than visual delayed memory.  The patient was also administered the TOMM test of memory malingering which helps provide an objective, criterion based test that helps discriminate between individuals with bona fide memory impairments and those with feigned symptoms of impaired memory.  This measure consist of 2 learning trials and a retention trial.  This measure looks at the statistical likelihood of any particular outcome and given the fact that the probability of correct responses is essentially 50% and a situation was 0 memory performance is below 50% are considered suspect and even performances between 50% and 70% become questionable.  If the patient's performance drops below 45 on any trial there raises concerns about either severe memory deficits and/or concerns that the individual may not be putting forth maximum effort.  If the patient produces performances below 18 items correct there is a 95% confidence level that the patient is purposefully trying to miss or malinger impairments.  Results of the TOMM are as follows:   Trial 1: 29/50, Invalid  Trail 2: 30/50, Invalid Retention: 28/50, Invalid   The results of this measure do suggest that the patient's memory and recall on this measure were significantly impaired.  While the level of impairment is below the 45 cut off to consider full and effortful performance she did get between 28 and 30 items correct on each trial suggesting that there was not a purposeful attempt to miss items  but the results are either indicative of significant profound memory deficits which were found on some memory items but also questioning the level of effort and understanding of various memory task.  Summary of Results:   The results of the current neuropsychological evaluation do identify significant cognitive deficits that show a fairly  consistent pattern across neuropsychological measures.  The patient's greatest difficulties have to do with auditory encoding and auditory working memory that is also having a significant negative impact on the patient's memory functions and the greatest impact on her auditory learning and information both immediate and delayed.  The patient also consistent deficits with regard to visual memory and learning although this was less significant but was indicative of deficits.  The patient was administered the TOMM test which looks for exaggerate framing of deficits.  While the patient produced performances that were significantly impaired they were above the 50-50 threshold and significantly below of the threshold suggesting purposeful or directed errors.  The patient's motivation on some measures may be in question but there is a fairly consistent pattern of deficits with regard to encoding deficits both visually and auditorily as well as memory deficits with greatest deficits auditorily.  This is a consistent pattern.  the patient showed average performance with regard to verbal reasoning and social judgment and other verbal tasks as well as good performance with regard to visual spatial and visual analysis types of information.  All of these were in the average range and were not indicative of an individual purposefully trying to miss items are make themselves look worse but may be indicative of a person is very frustrated and has some periods of variation of effort.  Impression/Diagnosis:   The overall results of the current neuropsychological evaluation are consistent with  specific deficits with regard to encoding.  The greatest deficits were seen with auditory encoding but also identified with visual encoding.  These aspects are having a significant deleterious effect on the patient's auditory and visual learning with greatest deficits in the area of auditory learning later retrieval of delayed recall.  The patient performed in the average range with regard to verbal comprehension skills and visual spatial and visual analysis skills.  This pattern is consistent with potential subcortical issues and if there is a neurological explanation of these deficits it may be related to type of event that occurred the night after her injury.  However, on top of these cognitive deficits the patient is very frustrated and agitated as well.  While there is some significant concern about the nature of the patient's symptoms are testing was consistent with memory deficits without deficits identified another areas of functioning which is very consistent with the patient's reported symptoms.  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 Neuropsychologist    WAIS-IV Wechsler Adult Intelligence Scale-Fourth Edition Score Report   Examinee Name Rennie Hack  Date of Report 2018/05/08  Justice Rocher [id number]  Years of Education   Date of Birth [birth year]  Primary Language   Gender Female  Handedness   Race/Ethnicity   Examiner Name Ilean Skill  Date of Testing 2018/04/13  Age at Testing 62 years 8 months  Retest? No   Comments: Composite Score Summary  Scale Sum of Scaled Scores Composite Score Percentile Rank 95% Conf. Interval Qualitative Description  Verbal Comprehension 25 VCI 91 27 86-97 Average  Perceptual Reasoning 33 PRI 105 63 99-111 Average    Working Memory 9 WMI 69 2 64-78 Extremely Low  Processing Speed 16 PSI 89 23 82-98 Low Average  Full Scale 83 FSIQ 88 21 84-92 Low Average  General Ability 58 GAI 98  45 93-103 Average  Confidence Intervals are based on the Overall Average SEMs. The [employer] is an optional composite summary score that is less sensitive to the influence of working memory and processing speed. Because working memory and processing speed are vital to a comprehensive evaluation of cognitive ability, it should be noted that the [facility] does not have the breadth of construct coverage as the FSIQ.   ANALYSIS   Index Level Discrepancy Comparisons  Comparison Score 1 Score 2 Difference Critical Value .05 Significant Difference Y/N Base Rate by Overall Sample  VCI - PRI 91 105 -14 7.78 Y 15.0  VCI - WMI 91 69 22 8.31 Y 4.4  VCI - PSI 91 89 2 11.76 N 46.2  PRI - WMI 105 69 36 8.81 Y 0.5  PRI - PSI 105 89 16 12.12 Y 15.0  WMI - PSI 69 89 -20 12.47 Y 8.8  FSIQ - GAI 88 98 -10 3.29 Y 2.3  Base Rate by Overall Sample. Statistical significance (critical value) at the .05 level.  Verbal Comprehension Subtests Summary  Subtest Raw Score Scaled Score Percentile Rank Reference Group Scaled Score SEM  Similarities 24 9 37 10 1.04  Vocabulary 32 8 25 9  0.73  Information 10 8 25 8  0.73  (Comprehension) 25 10 50 11 1.16   Perceptual Reasoning Subtests Summary  Subtest Raw Score Scaled Score Percentile Rank Reference Group Scaled Score SEM  Block Design 32 8 25 7  0.95  Matrix Reasoning 22 14 91 13 0.95  Visual Puzzles 15 11 63 10 0.85  (Figure Weights) 16 12 75 10 0.90  (Picture Completion) 11 9 37 8 1.24   Working Doctor, general practice Raw Score Scaled Score Percentile Rank Reference Group Scaled Score SEM  Digit Span 16 4 2 4  0.73  Arithmetic 8 5 5 5  0.90  (Letter-Number Seq.) 19 9 37 9 1.04   Processing Speed Subtests Summary  Subtest Raw Score Scaled Score Percentile Rank Reference Group Scaled Score SEM   Symbol Search 20 6 9 5  1.56  Coding 63 10 50 8 1.20  (Cancellation) 34 9 37 8 1.62   Subtest Level Discrepancy Comparisons  Subtest Comparison Score 1 Score 2 Difference Critical Value .05 Significant Difference Y/N Base Rate  Digit Span - Arithmetic 4 5 -1 2.57 N 42.20  Symbol Search - Coding 6 10 -4 3.41 Y 7.90      DETERMINING STRENGTHS AND WEAKNESSES   Differences Between Subtest and Overall Mean of Subtest Scores  Subtest Subtest Scaled Score Mean Scaled Score Difference Critical Value .05 Strength or Weakness Base Rate  Block Design 8 8.30 -0.30 2.85  >25%  Similarities 9 8.30 0.70 2.82  >25%  Digit Span 4 8.30 -4.30 2.22 W 2-5%  Matrix Reasoning 14 8.30 5.70 2.54 S <1%  Vocabulary 8 8.30 -0.30 2.03  >25%  Arithmetic 5 8.30 -3.30 2.73 W 5-10%  Symbol Search 6 8.30 -2.30 3.42  >25%  Visual Puzzles 11 8.30 2.70 2.71  15-25%  Information 8 8.30 -0.30 2.19  >25%  Coding 10 8.30 1.70 2.97  >25%  Overall: Mean = 8.30, Scatter =  10, Base rate =  8.9 Base Rate for Intersubtest Scatter is reported for 10 Subtests. Statistical significance (critical value) at the .05 level.   PROCESS ANALYSIS   Perceptual Reasoning Process Score Summary  Process Score Raw Score Scaled Score Percentile Rank SEM  Block Design No Time Bonus 32 9 37 1.08    Working  Memory Process Score Summary  Process Score Raw Score Scaled Score Percentile Rank Base Rate SEM  Digit Span Forward 6 5 5  -- 1.24  Digit Span Backward 6 7 16  -- 1.12  Digit Span Sequencing 4 5 5  -- 1.27  Longest Digit Span Forward 5 -- -- 96.0 --  Longest Digit Span Backward 3 -- -- 96.0 --  Longest Digit Span Sequence 3 -- -- 97.5 --  Longest Letter-Number Sequence 6 -- -- 46.0 --    Process Level Discrepancy Comparisons  Process Comparison Score 1 Score 2 Difference Critical Value .05 Significant Difference Y/N Base Rate  Block Design - Block Design No Time Bonus 8 9 -1 3.08 N 24.0  Digit Span Forward - Digit  Span Backward 5 7 -2 3.65 N 31.5  Digit Span Forward - Digit Span Sequencing 5 5 0 3.60 N   Digit Span Backward - Digit Span Sequencing 7 5 2  3.56 N 29.9  Longest Digit Span Forward - Longest Digit Span Backward 5 3 2  -- -- 61.0  Longest Digit Span Forward - Longest Digit Span Sequence 5 3 2  -- -- 39.5  Longest Digit Span Backward - Longest Digit Span Sequence 3 3 0 -- --   Statistical significance (critical value) at the .05 level.   Raw Scores  Subtest Score Range Raw Score  Process Score Range Raw Score  Block Design 0-66 32  Block Design No Time Bonus 0-48 32  Similarities 0-36 24  Digit Span Forward 0-16 6  Digit Span 0-48 16  Digit Span Backward 0-16 6  Matrix Reasoning 0-26 22  Digit Span Sequencing 0-16 4  Vocabulary 0-57 32  Longest Digit Span Forward 0, 2-9 5  Arithmetic 0-22 8  Longest Digit Span Backward 0, 2-8 3  Symbol Search 0-60 20  Longest Digit Span Sequence 0, 2-9 3  Visual Puzzles 0-26 15  Longest Letter-Number Seq. 0, 2-8 6  Information 0-26 10      Coding 0-135 63      Letter-Number Seq. 0-30 19      Figure Weights 0-27 16      Comprehension 0-36 25      Cancellation 0-72 34      Picture Completion 0-24 11         WMS-IV Wechsler Memory Scale-Fourth Edition Score Report   Examinee Name Dailah Opperman  Date of Report 2018/05/08  Kingsley Spittle ID [id number]  Years of Education 41  Date of Birth Apr 04, 1970  Home Language   Gender Female  Handedness Not Specified  Race/Ethnicity   Examiner Name Ilean Skill  Date of Testing 2018/04/13  Age at Testing 59 years 85 months  Retest? No  Brief Cognitive Status Exam Classification  Age Years of Education Raw Score Classification Level Base Rate  47 years 8 months 14 34 Very Low 0.0    Index Score Summary  Index Sum of Scaled Scores Index Score Percentile Rank 95% Confidence Interval Qualitative Descriptor  Auditory Memory (AMI) 19 69 2 64-77 Extremely Low  Visual Memory (VMI) 30 85 16 80-91 Low Average    Visual Working Memory (VWMI) 13 80 9 74-89 Low Average  Immediate Memory (IMI) 28 80 9 75-87 Low Average  Delayed Memory (DMI) 21 67 1 62-76 Extremely Low      Primary Subtest Scaled Score Summary  Subtest Domain Raw Score Scaled Score Percentile Rank  Logical Memory I AM 17 6 9   Logical Memory II AM 6 3 1   Verbal Paired Associates  I AM 18 6 9   Verbal [facility] [facility] II AM 3 4 2   Designs I VM 72 11 63  Designs II VM 50 9 37  Visual Reproduction I VM 25 5 5   Visual Reproduction II VM 7 5 5   Spatial Addition VWM 7 6 9   Symbol Span VWM 15 7 16     PROCESS SCORE CONVERSIONS  Auditory Memory Process Score Summary  Process Score Raw Score Scaled Score Percentile Rank Cumulative Percentage (Base Rate)  LM II Recognition 19 - - 3-9%  VPA II Recognition 24 - - <=2%   Visual Memory Process Score Summary  Process Score Raw Score Scaled Score Percentile Rank Cumulative Percentage (Base Rate)  DE I Content 34 9 37 -  DE I Spatial 20 13 84 -  DE II Content 35 10 50 -  DE II Spatial 11 9 37 -  DE II Recognition 10 - - 3-9%  VR II Recognition 2 - - <=2%    SUBTEST-LEVEL DIFFERENCES WITHIN INDEXES  Auditory Memory Index  Subtest Scaled Score AMI Mean Score Difference from Mean Critical Value Base Rate  Logical Memory I 6 4.75 1.25 2.64 >25%  Logical Memory II 3 4.75 -1.75 2.48 >25%  Verbal [facility] Associates I 6 4.75 1.25 1.90 >25%  Verbal [facility] Associates II 4 4.75 -0.75 2.48 >25%  Statistical significance (critical value) at the .05 level.   Visual Memory Index  Subtest Scaled Score VMI Mean Score Difference from Mean Critical Value Base Rate  Designs I 11 7.50 3.50 2.38 2-5%  Designs II 9 7.50 1.50 2.38 >25%  Visual Reproduction I 5 7.50 -2.50 1.86 15-25%  Visual Reproduction II 5 7.50 -2.50 1.48 15-25%  Statistical significance (critical value) at the .05 level.   Immediate Memory Index  Subtest Scaled Score IMI Mean Score Difference from Mean Critical Value Base  Rate  Logical Memory I 6 7.00 -1.00 2.59 >25%  Verbal [facility] [facility] I 6 7.00 -1.00 1.82 >25%  Designs I 11 7.00 4.00 2.42 5-10%  Visual Reproduction I 5 7.00 -2.00 1.91 >25%  Statistical significance (critical value) at the .05 level.   Delayed Memory Index  Subtest Scaled Score DMI Mean Score Difference from Mean Critical Value Base Rate  Logical Memory II 3 5.25 -2.25 2.44 >25%  Verbal [facility] [facility] II 4 5.25 -1.25 2.44 >25%  Designs II 9 5.25 3.75 2.44 5-10%  Visual Reproduction II 5 5.25 -0.25 1.57 >25%  Statistical significance (critical value) at the .05 level.    SUBTEST DISCREPANCY COMPARISON  Comparison Score 1 Score 2 Difference Critical Value Base Rate  Spatial Addition - Symbol Span 6 7 -1 2.74 85.9  Statistical significance (critical value) at the .05 level.    SUBTEST-LEVEL CONTRAST SCALED SCORES  Logical Memory  Score Score 1 Score 2 Contrast Scaled Score  LM II Recognition vs. Delayed Recall 3-9% 3 5  LM Immediate Recall vs. Delayed Recall 6 3 3    Verbal Pa[employer] Associates  Score Score 1 Score 2 Contrast Scaled Score  VPA II Recognition vs. Delayed Recall <=2% 4 10  VPA Immediate Recall vs. Delayed Recall 6 4 5    Designs  Score Score 1 Score 2 Contrast Scaled Score  DE I Spatial vs. Content 13 9 7   DE II Spatial vs. Content 9 10 11   DE II Recognition vs. Delayed Recall 3-9% 9 15  DE Immediate Recall vs. Delayed Recall 11 9 8    Visual Reproduction  Score Score 1 Score 2  Contrast Scaled Score  VR II Recognition vs. Delayed Recall <=2% 5 9  VR Immediate Recall vs. Delayed Recall 5 5 7     INDEX-LEVEL CONTRAST SCALED SCORES  WMS-IV Indexes  Score Score 1 Score 2 Contrast Scaled Score  Auditory Memory Index vs. Visual Memory Index 69 85 10  Visual Working Memory Index vs. Visual Memory Index 80 85 9  Immediate Memory Index vs. Delayed Memory Index 80 67 3    RAW SCORES  Subtest Score Range Adult Score Range Older Adult Raw Score  Brief  Cognitive Status Exam 0-58 0-58 34  Logical Memory I 0-50 0-53 17  Logical Memory II 0-50 0-39 6  Verbal [facility] Associates I 0-56 0-40 18  Verbal [facility] Associates II 0-14 0-10 3  CVLT-II Trials 1-5 5-95 5-95   CVLT-II Long-Delay -5-5 -5-5   Designs I 0-120  72  Designs II 0-120  50  Visual Reproduction I 0-43 0-43 25  Visual Reproduction II 0-43 0-43 7  Spatial Addition 0-24  7  Symbol Span 0-50 0-50 15  Process Score Range Adult Score Range Older Adult Raw Score  LM II Recognition 0-30 0-23 19  VPA II Recognition 0-40 0-30 24  VPA II Word Recall 0-28 0-20   DE I Content 0-48  34  DE I Spatial 0-24  20  DE II Content 0-48  35  DE II Spatial 0-24  11  DE II Recognition 0-24  10  VR II Recognition 0-7 0-7 2  VR II Copy 0-43 0-43     ABILITY-MEMORY ANALYSIS  Ability Score:  GAI: 98 Date of Testing:  WAIS-IV; WMS-IV 2018/04/13  Predicted Difference Method   Index Predicted WMS-IV Index Score Actual WMS-IV Index Score Difference Critical Value  Significant Difference Y/N Base Rate  Auditory Memory 99 69 30 9.38 Y 1%  Visual Memory 99 85 14 9.36 Y 10-15%  Visual Working Memory 99 80 19 11.05 Y 5%  Immediate Memory 99 80 19 10.29 Y 5%  Delayed Memory 99 67 32 10.13 Y <1%  Statistical significance (critical value) at the .01 level.   Contrast Scaled Scores  Score Score 1 Score 2 Contrast Scaled Score  General Ability Index vs. Auditory Memory Index 98 69 3  General Ability Index vs. Visual Memory Index 98 85 7  General Ability Index vs. Visual Working Memory Index 98 80 5  General Ability Index vs. Immediate Memory Index 98 80 5  General Ability Index vs. Delayed Memory Index 98 67 3  Verbal Comprehension Index vs. Auditory Memory Index 91 69 4  Perceptual Reasoning Index vs. Visual Memory Index 105 85 5  Perceptual Reasoning Index vs. Visual Working Memory Index 105 80 3  Working Memory Index vs. Auditory Memory Index 69 69 6  Working Memory Index vs. Visual  Working Memory Index 69 80 11    End of Report

## 2018-05-14 ENCOUNTER — Telehealth: Payer: Self-pay

## 2018-05-14 NOTE — Telephone Encounter (Signed)
Pt was contacted today regarding the temporary closing of [facility] due to Covid-19.  Therapist discussed:  Continued use of her memory notebook. SLP told pt I would also attempt to contact her sister. SLP did so but was unable to leave a message.   Patient is interested in further information for an e-visit, virtual check in, or telehealth visit, if those services become available.    [facility] will follow up with patients when we are able to resume care.  [facility], [facility] [address] [place], [place]  [address] Phone:  [phone] Fax:  [phone] \

## 2018-05-16 ENCOUNTER — Ambulatory Visit: Payer: 59

## 2018-05-16 ENCOUNTER — Ambulatory Visit: Payer: 59 | Admitting: Physical Therapy

## 2018-05-16 ENCOUNTER — Ambulatory Visit: Payer: 59 | Admitting: Occupational Therapy

## 2018-05-18 ENCOUNTER — Ambulatory Visit: Payer: Self-pay | Admitting: Psychology

## 2018-05-21 ENCOUNTER — Ambulatory Visit: Payer: Self-pay | Admitting: Psychology

## 2018-05-23 ENCOUNTER — Encounter: Payer: Self-pay | Admitting: Physical Medicine & Rehabilitation

## 2018-05-23 ENCOUNTER — Other Ambulatory Visit: Payer: Self-pay

## 2018-05-23 ENCOUNTER — Encounter
Payer: No Typology Code available for payment source | Attending: Physical Medicine & Rehabilitation | Admitting: Physical Medicine & Rehabilitation

## 2018-05-23 VITALS — BP 131/83 | Ht 66.0 in | Wt 220.0 lb

## 2018-05-23 DIAGNOSIS — G43711 Chronic migraine without aura, intractable, with status migrainosus: Secondary | ICD-10-CM | POA: Diagnosis not present

## 2018-05-23 DIAGNOSIS — G441 Vascular headache, not elsewhere classified: Secondary | ICD-10-CM | POA: Diagnosis not present

## 2018-05-23 DIAGNOSIS — I10 Essential (primary) hypertension: Secondary | ICD-10-CM

## 2018-05-23 DIAGNOSIS — F0781 Postconcussional syndrome: Secondary | ICD-10-CM | POA: Diagnosis not present

## 2018-05-23 MED ORDER — NORTRIPTYLINE HCL 50 MG PO CAPS: 50.0000 mg | ORAL_CAPSULE | Freq: Every day | ORAL | 3 refills | Status: DC

## 2018-05-23 MED ORDER — METHYLPHENIDATE HCL ER (LA) 20 MG PO CP24: 20.0000 mg | ORAL_CAPSULE | ORAL | 0 refills | Status: DC

## 2018-05-23 NOTE — Progress Notes (Signed)
Subjective:    Patient ID: Bridget Anderson, female    DOB: Feb 05, 1971, 48 y.o.   MRN: [id number]  HPI  TELEHEALTH NOTE Due to national recommendations of social distancing due to COVID 19, an audio/video telehealth visit is felt to be most appropriate for this patient at this time.  See Chart message from today for the patient's consent to telehealth from [place].    This is a telephone visit for Bridget Anderson who suffered concussion and is dealing with ongoing cognitive issues as well as headaches.  I last saw her about 6 weeks ago.  She was able to complete a few courses of physical therapy although headaches per staff were inhibitive.  I asked Bridget Anderson if her blood pressures have been under better control and she had difficulty answering the question.  Her daughter who was at phone side with her told her that her blood pressure was around 130/83 this morning.  I could not get any information regarding the overall control of her blood pressure.  She states that she is having ongoing headaches which are continuous.  The breakthrough Fioricet seems to help somewhat.  She states that headaches are more in the back of her head currently and in the neck although there still are some symptoms in the frontotemporal areas.  She is on nortriptyline at nighttime and Ritalin in the morning.  Insomnia remains an issue often due to her headaches.  She has help at home with this and assistance with her medication and safety.  The patient did mention that she has her medications organized and a pill dispenser but has difficult time remembering any of her medications quite frankly.  Pain Inventory Average Pain 9 Pain Right Now 7 My pain is constant, aching and throbbing  In the last 24 hours, has pain interfered with the following? General activity 9 Relation with others 9 Enjoyment of life 9 What TIME of day is your pain at its worst? all Sleep (in general) Poor  Pain is  worse with: some activites Pain improves with: rest Relief from Meds: na  Mobility do you drive?  yes  Function not employed: date last employed workers comp  Neuro/Psych numbness dizziness confusion  Prior Studies Any changes since last visit?  no  Physicians involved in your care Any changes since last visit?  no   Family History  Problem Relation Age of Onset  . Hypertension Mother   . Hyperlipidemia Mother   . Hypertension Father   . Hyperlipidemia Father   . Thyroid disease Father   . Hypertension Brother    Social History   Socioeconomic History  . Marital status: Single    Spouse name: Not on file  . Number of children: 2  . Years of education: Not on file  . Highest education level: Associate degree: academic program  Occupational History  . Occupation: Administrator, arts  Social Needs  . Financial resource strain: Not on file  . Food insecurity:    Worry: Not on file    Inability: Not on file  . Transportation needs:    Medical: Not on file    Non-medical: Not on file  Tobacco Use  . Smoking status: Never Smoker  . Smokeless tobacco: Never Used  Substance and Sexual Activity  . Alcohol use: Yes    Comment: Rarely  . Drug use: No  . Sexual activity: Yes    Birth control/protection: Surgical  Lifestyle  . Physical activity:  Days per week: Not on file    Minutes per session: Not on file  . Stress: Not on file  Relationships  . Social connections:    Talks on phone: Not on file    Gets together: Not on file    Attends religious service: Not on file    Active member of club or organization: Not on file    Attends meetings of clubs or organizations: Not on file    Relationship status: Not on file  Other Topics Concern  . Not on file  Social History Narrative   Lives at home with her daughter   Right handed   Drinks 3 cups of caffeine daily   Past Surgical History:  Procedure Laterality Date  . ABDOMINAL HYSTERECTOMY N/A 07/16/2013    Procedure: HYSTERECTOMY ABDOMINAL;  Surgeon: Allyn Kenner, DO;  Location: [facility];  Service: Gynecology;  Laterality: N/A;  . BILATERAL SALPINGECTOMY Bilateral 07/16/2013   Procedure: BILATERAL SALPINGECTOMY;  Surgeon: Allyn Kenner, DO;  Location: [facility];  Service: Gynecology;  Laterality: Bilateral;  . CARPAL TUNNEL RELEASE  01/16/2012   Procedure: CARPAL TUNNEL RELEASE;  Surgeon: Nita Sells, MD;  Location: [facility];  Service: Orthopedics;  Laterality: Left;  . CARPAL TUNNEL RELEASE  02/20/2012   Procedure: CARPAL TUNNEL RELEASE;  Surgeon: Nita Sells, MD;  Location: [facility];  Service: Orthopedics;  Laterality: Right;  . COLONOSCOPY    . CYSTO WITH HYDRODISTENSION N/A 07/16/2013   Procedure: CYSTOSCOPY/HYDRODISTENSION;  Surgeon: Reece Packer, MD;  Location: [facility];  Service: Urology;  Laterality: N/A;  . DILATION AND CURETTAGE OF UTERUS    . FOOT SURGERY    . HYSTEROSCOPY W/D&C  10/31/2011   Procedure: DILATATION AND CURETTAGE /HYSTEROSCOPY;  Surgeon: Allyn Kenner, DO;  Location: [facility];  Service: Gynecology;  Laterality: N/A;  . LAPAROSCOPY N/A 07/16/2013   Procedure: LAPAROSCOPY DIAGNOSTIC;  Surgeon: Allyn Kenner, DO;  Location: [facility];  Service: Gynecology;  Laterality: N/A;  . PLANTAR FASCIA SURGERY    . PUBOVAGINAL SLING N/A 07/16/2013   Procedure: Gaynelle Arabian;  Surgeon: Reece Packer, MD;  Location: [facility];  Service: Urology;  Laterality: N/A;  . TUBAL LIGATION    . VAGINAL DELIVERY  1995,  2003   Past Medical History:  Diagnosis Date  . Chlamydia   . Diabetes ([place])   . GERD (gastroesophageal reflux disease)   . Hyperlipidemia   . Hypertension   . Migraine   . Obstructive sleep apnea on CPAP    pt has not worn CPAP in years  . Seasonal allergies   . Wears glasses    BP 131/83 Comment: pt reported, virtual visit  Ht 5\' 6"  (1.676 m)   Wt 220 lb (99.8 kg)   LMP 06/27/2013   BMI 35.51 kg/m    Opioid Risk Score:   Fall Risk Score:  `1  Depression screen PHQ 2/9  Depression screen [facility] 2/9 03/07/2018 11/20/2017 05/25/2017  Decreased Interest 0 2 3  Down, Depressed, Hopeless 0 2 1  PHQ - 2 Score 0 4 4  Altered sleeping - 3 2  Tired, decreased energy - 2 3  Change in appetite - 2 1  Feeling bad or failure about yourself  - 3 0  Trouble concentrating - 3 3  Moving slowly or fidgety/restless - 2 0  Suicidal thoughts - 1 0  PHQ-9 Score - 20 13  Difficult doing work/chores - Extremely dIfficult Not difficult at all  Review of Systems  Constitutional: Negative.   HENT: Negative.   Eyes: Negative.   Respiratory: Negative.   Cardiovascular: Negative.   Gastrointestinal: Negative.   Endocrine: Negative.   Genitourinary: Negative.   Musculoskeletal: Negative.   Skin: Negative.   Allergic/Immunologic: Negative.   Neurological: Positive for dizziness, numbness and headaches.  Psychiatric/Behavioral: Positive for confusion.  All other systems reviewed and are negative.      Objective:   Physical Exam     Assessment & Plan:  1. Post concussionsyndrome with ongoing headaches, memory loss, tinnitus, vestibular symptoms, insomnia and emotional lability 2.History of migraine headaches since her middle school years. Patient had been seen twice by neurology here in [place]. Apparently she has been on numerous medications in the past for prophylaxis and treatment 3. Uncontrolled hypertension 4. DM II    Plan: 1.  I see improvements with her focus and attention and perhaps some glimmer of improvement with her memory.  It is difficult to truly assess some of this over the phone however.  2. Consider resuming outpatient therapies as possible. 3. Increase nortriptyline to 50 mg at bedtime for sleep. 4.Continue Aricept for memory 10mg   5.Blood pressure control per primary.  Asked the patient to provide [place] a list of her blood pressures over the past week so I  can get a feel for how they are running.   6. Consider trial of Aimovig for headache prophylaxis.  I hesitate using other anticonvulsants for her prophylactic treatment as they likely will further blunts her cognition. 7.  Consider imaging of her cervical spine to assess for spondylosis or facet disease which may contribute to some of the posterior symptoms.  Cannot rule out occipital neuralgia.  Patient did have a history of chronic migraines prior to her injury.  Follow-up with me in about 2 months time. Continue neuropsychology follow-up.  I spent about 15 minutes with the patient and family on the phone this afternoon.

## 2018-05-24 ENCOUNTER — Encounter: Payer: No Typology Code available for payment source | Attending: Psychology | Admitting: Psychology

## 2018-05-24 ENCOUNTER — Encounter: Payer: Self-pay | Admitting: Psychology

## 2018-05-24 ENCOUNTER — Other Ambulatory Visit: Payer: Self-pay

## 2018-05-24 ENCOUNTER — Ambulatory Visit: Payer: Self-pay | Admitting: Physical Therapy

## 2018-05-24 ENCOUNTER — Encounter: Payer: Self-pay | Admitting: Occupational Therapy

## 2018-05-24 DIAGNOSIS — F0781 Postconcussional syndrome: Secondary | ICD-10-CM

## 2018-05-24 DIAGNOSIS — G43711 Chronic migraine without aura, intractable, with status migrainosus: Secondary | ICD-10-CM | POA: Diagnosis not present

## 2018-05-24 DIAGNOSIS — I10 Essential (primary) hypertension: Secondary | ICD-10-CM | POA: Diagnosis not present

## 2018-05-25 ENCOUNTER — Encounter: Payer: Self-pay | Admitting: Psychology

## 2018-05-25 ENCOUNTER — Telehealth: Payer: Self-pay | Admitting: Physical Therapy

## 2018-05-25 NOTE — Telephone Encounter (Signed)
LVM for patient's sister but did speak with patient.  Bridget Anderson was contacted today regarding temporary reduction of [facility]patient [facility] due to concerns for community transmission of COVID-19.  Patient identity was verified.  Assessed if patient needed to be seen in person by clinician (recent fall or acute injury that requires hands on assessment and advice, change in diet order, post-surgical, special cases, etc.).    Patient did not have an acute/special need that requires in person visit. Proceeded with phone call.  Therapist advised the patient to continue to perform his/her HEP and assured he/she had no unanswered questions or concerns at this time.  The patient expressed interest in being contacted for an E-Visit, virtual check in, or Telehealth visit to continue their plan of care, when those services become available and if this clinic will be able to provide to patients with residences out of state (pt lives in [place]).  [facility] will follow up with patient at that time.   Patient is aware we can be reached by telephone during limited business hours in the meantime.

## 2018-05-25 NOTE — Progress Notes (Signed)
05/24/2018: Today I provided feedback to the patient and her mother with the patient's sister available over speaker phone to also participate in the feedback session.  We reviewed the results of the current neuropsychological evaluation.  The full report can be found in the patient's chart for the visit dated 05/08/2018.  Below are the summary and impression/diagnoses from that evaluation.  The patient is continuing to have significant cognitive deficits with regard to memory and learning new information.  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  We have set the patient up for follow-up neuropsychological testing in approximately 6 to 9 months to assess for further change and I will remain available to the patient ongoing for therapeutic interventions if need be.  The patient and her family are also to contact me if there are significant changes in the patient's status either positive or negative.    Summary of Results:                              The results of the current neuropsychological evaluation do identify significant cognitive deficits that show a fairly consistent pattern across neuropsychological measures.  The patient's greatest difficulties have to do with auditory encoding and auditory working memory that is also having a significant negative impact on the patient's memory functions and the greatest impact on her auditory learning and information both immediate and delayed.  The patient also consistent deficits with regard to visual memory and learning although this was less significant but was indicative of deficits.  The patient was administered the TOMM test which looks for exaggerate framing of deficits.  While the patient produced performances  that were significantly impaired they were above the 50-50 threshold and significantly below of the threshold suggesting purposeful or directed errors.  The patient's motivation on some measures may be in question but there is a fairly consistent pattern of deficits with regard to encoding deficits both visually and auditorily as well as memory deficits with greatest deficits auditorily.  This is a consistent pattern.  the patient showed average performance with regard to verbal reasoning and social judgment and other verbal tasks as well as good performance with regard to visual spatial and visual analysis types of information.  All of these were in the average range and were not indicative of an individual purposefully trying to miss items are make themselves look worse but may be indicative of a person is very frustrated and has some periods of variation of effort.  Impression/Diagnosis:                           The overall results of the current neuropsychological evaluation are consistent with specific deficits with regard to encoding.  The greatest deficits were seen with auditory encoding but also identified with visual encoding.  These aspects are having a significant deleterious effect on the patient's auditory and visual learning with greatest deficits in the area of auditory learning later retrieval of delayed recall.  The patient performed in the average range with regard to verbal comprehension skills and visual spatial and visual analysis skills.  This pattern is consistent with potential subcortical issues and  if there is a neurological explanation of these deficits it may be related to type of event that occurred the night after her injury.  However, on top of these cognitive deficits the patient is very frustrated and agitated as well.  While there is some significant concern about the nature of the patient's symptoms are testing was consistent with memory deficits without deficits identified  another areas of functioning which is very consistent with the patient's reported symptoms.  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 Neuropsychologist

## 2018-06-05 ENCOUNTER — Other Ambulatory Visit: Payer: Self-pay | Admitting: Physical Medicine & Rehabilitation

## 2018-06-05 DIAGNOSIS — E7849 Other hyperlipidemia: Secondary | ICD-10-CM

## 2018-06-06 ENCOUNTER — Ambulatory Visit: Payer: Self-pay | Admitting: Physical Therapy

## 2018-06-06 ENCOUNTER — Encounter: Payer: Self-pay | Admitting: Occupational Therapy

## 2018-06-12 ENCOUNTER — Encounter: Payer: Self-pay | Admitting: Occupational Therapy

## 2018-06-12 ENCOUNTER — Ambulatory Visit: Payer: Self-pay | Admitting: Physical Therapy

## 2018-07-26 ENCOUNTER — Ambulatory Visit: Payer: No Typology Code available for payment source | Admitting: Physical Therapy

## 2018-07-26 ENCOUNTER — Ambulatory Visit: Payer: No Typology Code available for payment source | Admitting: Occupational Therapy

## 2018-07-26 ENCOUNTER — Encounter: Payer: Self-pay | Admitting: Physical Therapy

## 2018-07-26 ENCOUNTER — Telehealth: Payer: Self-pay | Admitting: Physical Therapy

## 2018-07-26 ENCOUNTER — Other Ambulatory Visit: Payer: Self-pay

## 2018-07-26 ENCOUNTER — Ambulatory Visit: Payer: No Typology Code available for payment source | Attending: Physical Medicine & Rehabilitation

## 2018-07-26 ENCOUNTER — Ambulatory Visit: Payer: No Typology Code available for payment source

## 2018-07-26 VITALS — BP 130/70 | HR 50

## 2018-07-26 DIAGNOSIS — R42 Dizziness and giddiness: Secondary | ICD-10-CM

## 2018-07-26 DIAGNOSIS — R41841 Cognitive communication deficit: Secondary | ICD-10-CM | POA: Insufficient documentation

## 2018-07-26 DIAGNOSIS — R2681 Unsteadiness on feet: Secondary | ICD-10-CM

## 2018-07-26 DIAGNOSIS — M542 Cervicalgia: Secondary | ICD-10-CM

## 2018-07-26 DIAGNOSIS — R41840 Attention and concentration deficit: Secondary | ICD-10-CM

## 2018-07-26 DIAGNOSIS — R2689 Other abnormalities of gait and mobility: Secondary | ICD-10-CM

## 2018-07-26 DIAGNOSIS — M6281 Muscle weakness (generalized): Secondary | ICD-10-CM

## 2018-07-26 DIAGNOSIS — R41844 Frontal lobe and executive function deficit: Secondary | ICD-10-CM

## 2018-07-26 NOTE — Therapy (Signed)
[facility] [address] [place], [place], [address] Phone: [phone]   Fax:  [phone]  Physical Therapy Treatment and D/C Summary  Patient Details  Name: Bridget Anderson MRN: [id number] Date of Birth: 09/16/1970 Referring Provider (PT): Dr. Oval Linsey   Encounter Date: 07/26/2018   CLINIC OPERATION CHANGES: Outpatient Neuro Rehab is open at lower capacity following universal masking, social distancing, and patient screening.  The patient's COVID risk of complications score is 4.   PT End of Session - 07/26/18 2038    Visit Number  12    Number of Visits  17    Date for PT Re-Evaluation  06/07/18    Authorization Type  Workers Comp    PT Start Time  1300    PT Stop Time  1340    PT Time Calculation (min)  40 min    Activity Tolerance  Patient tolerated treatment well    Behavior During Therapy  WFL for tasks assessed/performed   unable to remember items during therapy      Past Medical History:  Diagnosis Date  . Chlamydia   . Diabetes ([place])   . GERD (gastroesophageal reflux disease)   . Hyperlipidemia   . Hypertension   . Migraine   . Obstructive sleep apnea on CPAP    pt has not worn CPAP in years  . Seasonal allergies   . Wears glasses     Past Surgical History:  Procedure Laterality Date  . ABDOMINAL HYSTERECTOMY N/A 07/16/2013   Procedure: HYSTERECTOMY ABDOMINAL;  Surgeon: Allyn Kenner, DO;  Location: [facility];  Service: Gynecology;  Laterality: N/A;  . BILATERAL SALPINGECTOMY Bilateral 07/16/2013   Procedure: BILATERAL SALPINGECTOMY;  Surgeon: Allyn Kenner, DO;  Location: [facility];  Service: Gynecology;  Laterality: Bilateral;  . CARPAL TUNNEL RELEASE  01/16/2012   Procedure: CARPAL TUNNEL RELEASE;  Surgeon: Nita Sells, MD;  Location: [facility];  Service: Orthopedics;  Laterality: Left;  . CARPAL TUNNEL RELEASE  02/20/2012   Procedure: CARPAL TUNNEL RELEASE;   Surgeon: Nita Sells, MD;  Location: [facility];  Service: Orthopedics;  Laterality: Right;  . COLONOSCOPY    . CYSTO WITH HYDRODISTENSION N/A 07/16/2013   Procedure: CYSTOSCOPY/HYDRODISTENSION;  Surgeon: Reece Packer, MD;  Location: [facility];  Service: Urology;  Laterality: N/A;  . DILATION AND CURETTAGE OF UTERUS    . FOOT SURGERY    . HYSTEROSCOPY W/D&C  10/31/2011   Procedure: DILATATION AND CURETTAGE /HYSTEROSCOPY;  Surgeon: Allyn Kenner, DO;  Location: [facility];  Service: Gynecology;  Laterality: N/A;  . LAPAROSCOPY N/A 07/16/2013   Procedure: LAPAROSCOPY DIAGNOSTIC;  Surgeon: Allyn Kenner, DO;  Location: [facility];  Service: Gynecology;  Laterality: N/A;  . PLANTAR FASCIA SURGERY    . PUBOVAGINAL SLING N/A 07/16/2013   Procedure: Gaynelle Arabian;  Surgeon: Reece Packer, MD;  Location: [facility];  Service: Urology;  Laterality: N/A;  . TUBAL LIGATION    . VAGINAL DELIVERY  1995,  2003    Vitals:   07/26/18 1305  BP: 130/70  Pulse: (!) 50    Subjective Assessment - 07/26/18 1311    Subjective  Sister with pt today.  OT reports that during OT session she felt like her "sugar" was dropping. Given a snack.  Still not feeling well, has a HA.  Per SLP therapist pt would like to finish with OT/PT today.    Patient is accompained by:  --   caregiver,  Bridget Anderson (with pt Tues-Friday)   Pertinent History  Pt hit by 85# pipe at work June 2019, post-concussive syndrome,memory deficits    Patient Stated Goals  Pt's goal is to stop hurting (02/05/18) (regarding headaches)    Currently in Pain?  Yes    Pain Score  5     Pain Location  Head    Pain Descriptors / Indicators  Headache    Pain Onset  More than a month ago         [facility] PT Assessment - 07/26/18 1322      Standardized Balance Assessment   Standardized Balance Assessment  Timed Up and Go Test;10 meter walk test;Dynamic Gait Index    10 Meter Walk  10 seconds or 3.28 ft/sec      Dynamic Gait  Index   Level Surface  Normal    Change in Gait Speed  Normal    Gait with Horizontal Head Turns  Normal    Gait with Vertical Head Turns  Mild Impairment    Gait and Pivot Turn  Normal    Step Over Obstacle  Mild Impairment    Step Around Obstacles  Normal    Steps  Mild Impairment    Total Score  21    DGI comment:  21/24      Timed Up and Go Test   Normal TUG (seconds)  12.78    Cognitive TUG (seconds)  21.87   cued to subtract by 3 starting at 80; pt started at 90   TUG Comments  71% increase in time between TUG and COG TU                           PT Education - 07/26/18 2038    Education Details  continue with current HEP; will continue with SLP but will D/C from PT/OT    Person(s) Educated  Patient;Other (comment)   sister   Methods  Explanation    Comprehension  Verbalized understanding       PT Short Term Goals - 04/05/18 1334      PT SHORT TERM GOAL #1   Title  = LTG        PT Long Term Goals - 07/26/18 1312      PT LONG TERM GOAL #1   Title  Pt will demonstrate compliance with vestibular and balance HEP with supervision and guidance from family or aide (Target all LTGs: 4 weeks, 06/07/2018)    Baseline  05/08/18 Headaches continue to limit pt's ability to tolerate/participate in HEP. Aide estimates they do full program 3x/week.     Status  Achieved      PT LONG TERM GOAL #2   Title  Pt will consistently report HA </= 4/10 during therapy and while performing HEP    Baseline  5-6/10 on a daily basis    Status  Not Met      PT LONG TERM GOAL #3   Title  patient to improve DGI to at least 19/24 for demonstrating reduced fall risk    Baseline  21/24    Status  Achieved      PT LONG TERM GOAL #4   Title  Pt will improve gait velocity to >/= 3.5 ft/sec without AD (norm for age, gender)    Baseline  3.2 ft/sec    Status  Partially Met            Plan - 07/26/18 2040  Clinical Impression Statement  Patient returns to clinic  after being on hold due to quarantine.  Performed reassessment of current functional level and progress towards goals.  Pt is requesting to be D/C from PT and OT but would like to continue with SLP as her main concern right now is cognition and memory.  Pt is still hopeful to return to work.  Pt has met 2/4 PT LTG demonstrating improvement in dynamic balance during gait and reports compliance with HEP with caregiver assistance.  Pt partially met gait velocity goal - gait velocity has remained stable at 3.0 - 3.2 ft/sec over past 4 assessments and is likely at patient's baseline.  Pt has not met pain goal and continues to experience 5-6/10 headache pain on a daily basis despite medical management and therapy management of musculoskeletal contributors.  Pt continues to have greater difficulty with cognitive dual task as evidenced by 71% difference between TUG and COG TUG.  Pt to continue with HEP and will continue with speech therapy.  Pt and sister agreeable to plan.    Rehab Potential  Fair    Clinical Impairments Affecting Rehab Potential  Severity of (cognitive) deficits following injury; family involvement (sister working, has possible hired caregiver for home)    PT Frequency  1x / week    PT Duration  4 weeks    PT Treatment/Interventions  ADLs/Self Care Home Management;Gait training;Functional mobility training;Therapeutic activities;Therapeutic exercise;Patient/family education;Neuromuscular re-education;Balance training;Canalith Repostioning;Visual/perceptual remediation/compensation;Vestibular;Stair training;Cognitive remediation;Manual techniques;Passive range of motion;Dry needling    PT Next Visit Plan  D/C    PT Home Exercise Plan  [id number]    Consulted and Agree with Plan of Care  Patient    Family Member Consulted  sister       Patient will benefit from skilled therapeutic intervention in order to improve the following deficits and impairments:  Abnormal gait, Decreased balance, Decreased  safety awareness, Decreased mobility, Difficulty walking, Postural dysfunction, Dizziness, Decreased activity tolerance, Pain  Visit Diagnosis: Unsteadiness on feet  Cervicalgia  Other abnormalities of gait and mobility  Dizziness and giddiness     Problem List Patient Active Problem List   Diagnosis Date Noted  . Anterograde amnesia   . Postconcussive syndrome 01/01/2018  . Asthma   . Essential hypertension   . Diabetes mellitus type 2 in obese ([place])   . Dyslipidemia   . Vascular headache   . Chronic post-traumatic headache, not intractable 11/20/2017  . Shortness of breath on exertion 05/25/2017  . Other fatigue 05/25/2017  . Type 2 diabetes mellitus without complication, without long-term current use of insulin ([place]) 05/25/2017  . Uncontrolled hypertension 05/25/2017  . Chronic migraine without aura, with intractable migraine, so stated, with status migrainosus 03/20/2017  . S/P hysterectomy 07/16/2013   PHYSICAL THERAPY DISCHARGE SUMMARY  Visits from Start of Care: 12  Current functional level related to goals / functional outcomes: See impression statement and LTG achievement above   Remaining deficits: Headaches, impaired cognition   Education / Equipment: HEP  Plan: Patient agrees to discharge.  Patient goals were partially met. Patient is being discharged due to being pleased with the current functional level.  ?????     Rico Junker, PT, DPT 07/26/18    9:02 PM    [facility] [address] [place], [place], [address] Phone: [phone]   Fax:  [phone]  Name: Bridget Anderson MRN: [id number] Date of Birth: 1971-01-04

## 2018-07-26 NOTE — Patient Instructions (Addendum)
   We will work together about 3 more times in the next 8-10 weeks, to help your family be able to better assist you at home.   Your score on your memory test today (07-25-18) was not different as it was in December 2019. I don't think your memory has changed since December. You ARE writing more things down now, so you are able to see what has happened in the past. However I do not think you are recalling these things later, when you look at them written down.  Given your memory difficulties, you should use the microwave if you want to cook, and you should allow others to drive you and/or [place] Le places.  You are not ready to go back to work, and might not be able to go back to work at your old job, given the severity of your memory difficulties.

## 2018-07-26 NOTE — Therapy (Signed)
[facility] [address] [place], [place], [address] Phone: [phone]   Fax:  [phone]  Occupational Therapy Treatment  Patient Details  Name: BRENTLEY HORRELL MRN: [id number] Date of Birth: 14-Dec-1970 No data recorded  Encounter Date: 07/26/2018  OT End of Session - 07/26/18 1310    Visit Number  6    Number of Visits  16    Date for OT Re-Evaluation  06/03/18    Authorization Type  Workman's Comp    OT Start Time  1305    OT Stop Time  1355    OT Time Calculation (min)  50 min    Activity Tolerance  Patient tolerated treatment well    Behavior During Therapy  [facility] for tasks assessed/performed       Past Medical History:  Diagnosis Date  . Chlamydia   . Diabetes ([facility])   . GERD (gastroesophageal reflux disease)   . Hyperlipidemia   . Hypertension   . Migraine   . Obstructive sleep apnea on CPAP    pt has not worn CPAP in years  . Seasonal allergies   . Wears glasses     Past Surgical History:  Procedure Laterality Date  . ABDOMINAL HYSTERECTOMY N/A 07/16/2013   Procedure: HYSTERECTOMY ABDOMINAL;  Surgeon: Allyn Kenner, DO;  Location: [facility];  Service: Gynecology;  Laterality: N/A;  . BILATERAL SALPINGECTOMY Bilateral 07/16/2013   Procedure: BILATERAL SALPINGECTOMY;  Surgeon: Allyn Kenner, DO;  Location: [facility];  Service: Gynecology;  Laterality: Bilateral;  . CARPAL TUNNEL RELEASE  01/16/2012   Procedure: CARPAL TUNNEL RELEASE;  Surgeon: Nita Sells, MD;  Location: [facility];  Service: Orthopedics;  Laterality: Left;  . CARPAL TUNNEL RELEASE  02/20/2012   Procedure: CARPAL TUNNEL RELEASE;  Surgeon: Nita Sells, MD;  Location: [facility];  Service: Orthopedics;  Laterality: Right;  . COLONOSCOPY    . CYSTO WITH HYDRODISTENSION N/A 07/16/2013   Procedure: CYSTOSCOPY/HYDRODISTENSION;  Surgeon: Reece Packer, MD;  Location: [facility];  Service:  Urology;  Laterality: N/A;  . DILATION AND CURETTAGE OF UTERUS    . FOOT SURGERY    . HYSTEROSCOPY W/D&C  10/31/2011   Procedure: DILATATION AND CURETTAGE /HYSTEROSCOPY;  Surgeon: Allyn Kenner, DO;  Location: [facility];  Service: Gynecology;  Laterality: N/A;  . LAPAROSCOPY N/A 07/16/2013   Procedure: LAPAROSCOPY DIAGNOSTIC;  Surgeon: Allyn Kenner, DO;  Location: [facility];  Service: Gynecology;  Laterality: N/A;  . PLANTAR FASCIA SURGERY    . PUBOVAGINAL SLING N/A 07/16/2013   Procedure: Gaynelle Arabian;  Surgeon: Reece Packer, MD;  Location: [facility];  Service: Urology;  Laterality: N/A;  . TUBAL LIGATION    . [place],  2003    There were no vitals filed for this visit.  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 PMH: HTN, DM, migraines    Limitations  decreased memory and awareness into deficits    Currently in Pain?  Yes    Pain Score  5     Pain Location  Head    Pain Descriptors / Indicators  Headache;Burning    Pain Type  Chronic pain    Pain Onset  More than a month ago    Pain Frequency  Constant    Aggravating Factors   moving    Pain Relieving Factors  meds         CLINIC OPERATION CHANGES: Outpatient Neuro  Rehab is open at lower capacity following universal masking, social distancing, and patient screening.  The patient's COVID risk of complications score is 4.  Assessed remaining goals and progress to date. Pt's sister present for education and therefore re-issued putty HEP, Theraband HEP and reviewed. Also reviewed memory strategies and discussed plans to d/c and focus on Speech therapy. Recommended possibly getting speech services in [place] closer to their home in [place], [place].  Also discussed pt's pain in Lt elbow and suspect lateral epicondylitis - recommended discussing with PCP and possible referral to orthopedic MD.  Pt's sister was present for all education and  recommendations                    OT Short Term Goals - 07/26/18 1311      OT SHORT TERM GOAL #1   Title  Independent with HEP for LUE strength and grip strength - due 05/03/18    Time  4    Period  Weeks    Status  Achieved   But requires assist to remember to do it     OT SHORT TERM GOAL #2   Title  Pt to verbalize understanding with memory strategies for functional tasks     Time  4    Period  Weeks    Status  Achieved   But assist needed for carryover     OT SHORT TERM GOAL #3   Title  Pt to report dizziness less than 5/10 with functional tasks (loading/unloading dishwasher, laundry tasks, etc)     Time  4    Period  Weeks    Status  Achieved      OT SHORT TERM GOAL #4   Title  Pt to perform simple familiar cooking task w/ no more than min cueing    Time  4    Period  Weeks    Status  Achieved   min cueing needed     OT SHORT TERM GOAL #5   Title  Pt to attend to cognitive task for 10 min. in quiet environment in prep for meal planning/money management    Time  4    Period  Weeks    Status  Achieved        OT Long Term Goals - 07/26/18 1403      OT LONG TERM GOAL #1   Title  Pt to improve grip strength Lt hand to 35 lbs or greater - due 06/03/18    Baseline  eval = 18 lbs, reeval on 04/04/18 = 25 lbs    Time  8    Period  Weeks    Status  Achieved   07/26/18 = 35 LBS     OT LONG TERM GOAL #2   Title  Pt to perform simple novel cooking task with min cues only    Time  8    Period  Weeks    Status  Deferred   D/T severe memory deficits     OT LONG TERM GOAL #3   Title  Pt to attend to simple cognitve task (meal planning, basic money management) in moderately distracting enviornment for 15 min. w/o rest    Time  8    Period  Weeks    Status  Not Met      OT LONG TERM GOAL #4   Title  Pt to demo sufficient working memory for simple organizational/problem solving task    Time  8      Period  Weeks    Status  Not Met      OT LONG TERM  GOAL #5   Title  Pt to perform environmental scanning in busy environment w/ 90% accuracy    Time  8    Period  Weeks    Status  Unable to assess   Did not have time to assess during last session           Plan - 07/26/18 1404    Clinical Impression Statement  Pt has met all STG's and LTG #1. Pt unable to meet remaining LTG's due to memory deficits, and poor previous carryover of recommendations.     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 (per discussion with patient and sister). Pt will continue to work on cogniton/memory compensations and family education with speech therapy    Consulted and Agree with Plan of Care  Patient;Family member/caregiver    Family Member Consulted  Sister (Shauna)        Patient will benefit from skilled therapeutic intervention in order to improve the following deficits and impairments:     Visit Diagnosis: Attention and concentration deficit  Muscle weakness (generalized)  Frontal lobe and executive function deficit    Problem List Patient Active Problem List   Diagnosis Date Noted  . Anterograde amnesia   . Postconcussive syndrome 01/01/2018  . Asthma   . Essential hypertension   . Diabetes mellitus type 2 in obese ([facility])   . Dyslipidemia   . Vascular headache   . Chronic post-traumatic headache, not intractable 11/20/2017  . Shortness of breath on exertion 05/25/2017  . Other fatigue 05/25/2017  . Type 2 diabetes mellitus without complication,  without long-term current use of insulin ([facility]) 05/25/2017  . Uncontrolled hypertension 05/25/2017  . Chronic migraine without aura, with intractable migraine, so stated, with status migrainosus 03/20/2017  . S/P hysterectomy 07/16/2013    OCCUPATIONAL THERAPY DISCHARGE SUMMARY  Visits from Start of Care: 6  Current functional level related to goals / functional outcomes: See above   Remaining deficits: Memory Awareness into deficits Attention   Education / Equipment: HEP's, memory strategies  Plan: Patient agrees to discharge.  Patient goals were partially met. Patient is being discharged due to lack of progress.  ?????         ,  Johnson, OTR/L 07/26/2018, 2:07 PM  [facility] [address] [place], [place], [address] Phone: [phone]   Fax:  [phone]  Name: Yanelle L Weismann MRN: [id number] Date of Birth: 01/06/1971 

## 2018-07-26 NOTE — Therapy (Addendum)
[facility] [address] [place], [place], [address] Phone: [phone]   Fax:  [phone]  Speech Language Pathology Treatment  Patient Details  Name: Bridget Anderson MRN: [id number] Date of Birth: [birth year] Referring Provider (SLP): Alger Simons, MD   Encounter Date: 07/26/2018  End of Session - 07/26/18 1314    Visit Number 11    Number of Visits  25    Date for SLP Re-Evaluation  10/17/18    Authorization Type  workmen's comp    Authorization - Number of Visits  25    SLP Start Time  1005    SLP Stop Time   9924    SLP Time Calculation (min)  48 min    Activity Tolerance  Patient tolerated treatment well       Past Medical History:  Diagnosis Date  . Chlamydia   . Diabetes ([place])   . GERD (gastroesophageal reflux disease)   . Hyperlipidemia   . Hypertension   . Migraine   . Obstructive sleep apnea on CPAP    pt has not worn CPAP in years  . Seasonal allergies   . Wears glasses     Past Surgical History:  Procedure Laterality Date  . ABDOMINAL HYSTERECTOMY N/A 07/16/2013   Procedure: HYSTERECTOMY ABDOMINAL;  Surgeon: Allyn Kenner, DO;  Location: [facility];  Service: Gynecology;  Laterality: N/A;  . BILATERAL SALPINGECTOMY Bilateral 07/16/2013   Procedure: BILATERAL SALPINGECTOMY;  Surgeon: Allyn Kenner, DO;  Location: [facility];  Service: Gynecology;  Laterality: Bilateral;  . CARPAL TUNNEL RELEASE  01/16/2012   Procedure: CARPAL TUNNEL RELEASE;  Surgeon: Nita Sells, MD;  Location: [facility];  Service: Orthopedics;  Laterality: Left;  . CARPAL TUNNEL RELEASE  02/20/2012   Procedure: CARPAL TUNNEL RELEASE;  Surgeon: Nita Sells, MD;  Location: [facility];  Service: Orthopedics;  Laterality: Right;  . COLONOSCOPY    . CYSTO WITH HYDRODISTENSION N/A 07/16/2013   Procedure: CYSTOSCOPY/HYDRODISTENSION;  Surgeon: Reece Packer, MD;  Location: [facility];   Service: Urology;  Laterality: N/A;  . DILATION AND CURETTAGE OF UTERUS    . FOOT SURGERY    . HYSTEROSCOPY W/D&C  10/31/2011   Procedure: DILATATION AND CURETTAGE /HYSTEROSCOPY;  Surgeon: Allyn Kenner, DO;  Location: [facility];  Service: Gynecology;  Laterality: N/A;  . LAPAROSCOPY N/A 07/16/2013   Procedure: LAPAROSCOPY DIAGNOSTIC;  Surgeon: Allyn Kenner, DO;  Location: [facility];  Service: Gynecology;  Laterality: N/A;  . PLANTAR FASCIA SURGERY    . PUBOVAGINAL SLING N/A 07/16/2013   Procedure: Gaynelle Arabian;  Surgeon: Reece Packer, MD;  Location: [facility];  Service: Urology;  Laterality: N/A;  . TUBAL LIGATION    . [place],  2003    There were no vitals filed for this visit.  Subjective Assessment - 07/26/18 1113    Subjective  Pt sister: "The book is in my niece's car - she lives in [place]."    [place]: "You seem like you're a nice person."    Currently in Pain?  Yes    Pain Score  5     Pain Location  Head    Pain Orientation  Right;Posterior;Upper    Pain Descriptors / Indicators  Headache;Burning    Pain Type  Chronic pain    Pain Onset  More than a month ago    Pain Frequency  Constant            ADULT  SLP TREATMENT - 07/26/18 1117      General Information   Behavior/Cognition  Alert;Cooperative;Pleasant mood;Confused;Decreased sustained attention      Treatment Provided   Treatment provided  Cognitive-Linquistic      Cognitive-Linquistic Treatment   Treatment focused on  Cognition    Skilled Treatment  Pt was surprised to learn she had been here 8 times. She asked what the purpose of today was 8 minutes after SLP explained this to pt. Sister reports that pt is encouraged to use sticky notes, her memory notebook, and whiteboard notes to assist memory. Pt states she believes she "is better". SLP performed the same assessment today as in December 2019 to track progress and assess whether pt would benefit from cont ST. Hopkins Verbal Learning  assessmet with scores not signiticantly different as in Dec 2019. (3/12, 3/12, and 3/12 (compared to 2/12, 3/12, and 2/12 in December 2019). I explained to pt/sister that SLP has offered all compensations he could offer at this time and the only other aspect to work on at this time would be with sister and/or daughter for family education for consistency with compensations and cues for pt for slight incr'd pt independence. Pt stated, "I have to go back to work. Send  me back to work." SLP told pt directly she would not be able to perform her duties at work given 1) the scores of assessment today, 2) cont'd severity of deficit, as well as 3) time post onset (12 months). SLP agreed to have pt sister and possibly daughter attend ST with pt for 2-3 visits in the next 3 months for family ed how to work with pt's compensations to maximize pt independence.      Assessment / Recommendations / Plan   Plan  Continue with current plan of care      Progression Toward Goals   Progression toward goals  Progressing toward goals         SLP Short Term Goals - 05/08/18 1311      SLP SHORT TERM GOAL #1   Title  pt will demo use of memory enhancing system outside of ST session (i.e., writing down items/entries in a memory journal) between 4 sessions to enhance or provide a means of functional memory for pt    Baseline  04-10-18, 04-25-18    Status  Partially Met   from 8 sessions to 4     SLP [place] #2   Title  pt will tell SLP 3 practical ramifications of her cognitive deficits with modified independence (i.e., written notes) in 4 sessions    Status  Not Met      SLP SHORT TERM GOAL #3   Title  pt will give adequate synopsis of salient written material she enjoys with modified independence over two sessions    Status  Deferred   to work on memory compensations for functional pt tasks      SLP Long Term Goals - 07/26/18 [place] #1   Title  pt will demo appropriate usage of a  memory enhancement/compensation system between 3 ST sessions    Time  4    Period  --   sessions   Status  Revised   from " between 10 ST sessions"     SLP LONG TERM GOAL #2   Title  pt's family and/or caregivers will report benefit from education in order to improve pt's independence    Time  4    Period  --   sessions   Status  New       Plan - 07/26/18 1316    Clinical Impression Statement   Pt presentation identical to March 2020 (3 months ago). Pt score on Hopkins Verbal Learning Test unchanged since December 2019. SLP told pt and sister today pt education about memory compensations (for daily events, med administration, appointments, questions for appointments, completion of therapy exercises, etc) have been exhausted. The only other thing left at this time is to work with pt and those who spend most time with pt, and who can successfully convey information to other pertientn parties who work with Bridget Anderson, to build some consistency in cueing and education how best to work with pt, given her profound memory deficit. Pt and sister agreed with this. This would likely be approx 4 sessions over the next 8-10 weeks or so. Pt continues with profound memory encoding deficits. Pt did not bring planner today as she left it in her older daughter's car who lives in [place], [place]. In this SLP's opinion pt will not ever be able to return to work in her same capacity. See skilled intervention for further details about today's visit.     Speech Therapy Frequency  --   once next week or the following week, once in two more weeks, then once approx every four weeks for 1 or 2 more visits   Duration  --   total of no more than 4 more sessions   Treatment/Interventions  Compensatory strategies;Patient/family education;Functional tasks;Cueing hierarchy;Cognitive reorganization;Environmental controls;Internal/external aids;SLP instruction and feedback   family/caregiver education   Potential to Achieve Goals   Fair    Potential Considerations  Cooperation/participation level;Severity of impairments;Ability to learn/carryover information       Patient will benefit from skilled therapeutic intervention in order to improve the following deficits and impairments:   Cognitive communication deficit - Plan: SLP plan of care cert/re-cert    Problem List Patient Active Problem List   Diagnosis Date Noted  . Anterograde amnesia   . Postconcussive syndrome 01/01/2018  . Asthma   . Essential hypertension   . Diabetes mellitus type 2 in obese ([place])   . Dyslipidemia   . Vascular headache   . Chronic post-traumatic headache, not intractable 11/20/2017  . Shortness of breath on exertion 05/25/2017  . Other fatigue 05/25/2017  . Type 2 diabetes mellitus without complication, without long-term current use of insulin ([place]) 05/25/2017  . Uncontrolled hypertension 05/25/2017  . Chronic migraine without aura, with intractable migraine, so stated, with status migrainosus 03/20/2017  . S/P hysterectomy 07/16/2013    [facility] ,MS, CCC-SLP  07/26/2018, 1:41 PM  [facility] [address] [place], [place], [address] Phone: [phone]   Fax:  [phone]   Name: Bridget Anderson MRN: [id number] Date of Birth: 1970/06/26

## 2018-07-26 NOTE — Telephone Encounter (Signed)
Opened in error; wrong patient

## 2018-07-31 ENCOUNTER — Other Ambulatory Visit: Payer: Self-pay

## 2018-07-31 ENCOUNTER — Encounter
Payer: No Typology Code available for payment source | Attending: Psychology | Admitting: Physical Medicine & Rehabilitation

## 2018-07-31 ENCOUNTER — Encounter: Payer: Self-pay | Admitting: Physical Medicine & Rehabilitation

## 2018-07-31 VITALS — BP 165/94 | HR 56 | Temp 97.0°F | Ht 66.0 in | Wt 213.0 lb

## 2018-07-31 DIAGNOSIS — Z79899 Other long term (current) drug therapy: Secondary | ICD-10-CM

## 2018-07-31 DIAGNOSIS — F0781 Postconcussional syndrome: Secondary | ICD-10-CM | POA: Insufficient documentation

## 2018-07-31 DIAGNOSIS — I10 Essential (primary) hypertension: Secondary | ICD-10-CM | POA: Diagnosis not present

## 2018-07-31 DIAGNOSIS — Z5181 Encounter for therapeutic drug level monitoring: Secondary | ICD-10-CM | POA: Diagnosis not present

## 2018-07-31 DIAGNOSIS — G4701 Insomnia due to medical condition: Secondary | ICD-10-CM

## 2018-07-31 DIAGNOSIS — R413 Other amnesia: Secondary | ICD-10-CM

## 2018-07-31 DIAGNOSIS — G441 Vascular headache, not elsewhere classified: Secondary | ICD-10-CM

## 2018-07-31 DIAGNOSIS — G44329 Chronic post-traumatic headache, not intractable: Secondary | ICD-10-CM

## 2018-07-31 MED ORDER — TOPIRAMATE 25 MG PO TABS: 25.0000 mg | ORAL_TABLET | Freq: Every day | ORAL | 3 refills | Status: DC

## 2018-07-31 MED ORDER — METHYLPHENIDATE HCL ER (LA) 20 MG PO CP24: 20.0000 mg | ORAL_CAPSULE | ORAL | 0 refills | Status: DC

## 2018-07-31 MED ORDER — HYDROCHLOROTHIAZIDE 25 MG PO TABS: 25.0000 mg | ORAL_TABLET | Freq: Every day | ORAL | 3 refills | Status: AC

## 2018-07-31 NOTE — Patient Instructions (Signed)
IN A WEEK YOU MAY INCREASE TOPAMAX TO 50MG  AT NIGHT.

## 2018-07-31 NOTE — Progress Notes (Signed)
UDS cancelled for today.  Patient did not want to do test until after speaking with lawyer due to her being a workers comp patient.  Dr. Naaman Plummer agreed to cancel the UDS test today.

## 2018-07-31 NOTE — Progress Notes (Signed)
Subjective:    Patient ID: Bridget Anderson, female    DOB: November 03, 1970, 48 y.o.   MRN: [id number]  HPI   [name] is here in follow up of her post concussion syndrome and associated deficits. She has had an assistant at home whom I received notes from. It sounds as if she's been having more "good" than bad days over the last month. She struggles with memory but appears to be attending to some tasks better. She has been taking medications as prescribes. She describes an ongoing headache which is fairly constant. It's hard to tell a pattern given her inconsistent memory. Sleep is an issue too. Sister states that when she comes home from work, [name] will often wake up and be "wired" although that doesn't happen consistently.      Pain Inventory Average Pain 4 Pain Right Now 5 My pain is burning, stabbing, tingling and aching  In the last 24 hours, has pain interfered with the following? General activity 5 Relation with others 5 Enjoyment of life 9 What TIME of day is your pain at its worst? all Sleep (in general) Poor  Pain is worse with: unsure Pain improves with: medication Relief from Meds: 6  Mobility walk without assistance ability to climb steps?  yes do you drive?  yes  Function employed # of hrs/week 36-48  Neuro/Psych No problems in this area  Prior Studies Any changes since last visit?  no  Physicians involved in your care Any changes since last visit?  no   Family History  Problem Relation Age of Onset  . Hypertension Mother   . Hyperlipidemia Mother   . Hypertension Father   . Hyperlipidemia Father   . Thyroid disease Father   . Hypertension Brother    Social History   Socioeconomic History  . Marital status: Single    Spouse name: Not on file  . Number of children: 2  . Years of education: Not on file  . Highest education level: Associate degree: academic program  Occupational History  . Occupation: Administrator, arts  Social Needs  . Financial resource  strain: Not on file  . Food insecurity:    Worry: Not on file    Inability: Not on file  . Transportation needs:    Medical: Not on file    Non-medical: Not on file  Tobacco Use  . Smoking status: Never Smoker  . Smokeless tobacco: Never Used  Substance and Sexual Activity  . Alcohol use: Yes    Comment: Rarely  . Drug use: No  . Sexual activity: Yes    Birth control/protection: Surgical  Lifestyle  . Physical activity:    Days per week: Not on file    Minutes per session: Not on file  . Stress: Not on file  Relationships  . Social connections:    Talks on phone: Not on file    Gets together: Not on file    Attends religious service: Not on file    Active member of club or organization: Not on file    Attends meetings of clubs or organizations: Not on file    Relationship status: Not on file  Other Topics Concern  . Not on file  Social History Narrative   Lives at home with her daughter   Right handed   Drinks 3 cups of caffeine daily   Past Surgical History:  Procedure Laterality Date  . ABDOMINAL HYSTERECTOMY N/A 07/16/2013   Procedure: HYSTERECTOMY ABDOMINAL;  Surgeon: Allyn Kenner, DO;  Location: [facility];  Service: Gynecology;  Laterality: N/A;  . BILATERAL SALPINGECTOMY Bilateral 07/16/2013   Procedure: BILATERAL SALPINGECTOMY;  Surgeon: Allyn Kenner, DO;  Location: [facility];  Service: Gynecology;  Laterality: Bilateral;  . CARPAL TUNNEL RELEASE  01/16/2012   Procedure: CARPAL TUNNEL RELEASE;  Surgeon: Nita Sells, MD;  Location: [facility];  Service: Orthopedics;  Laterality: Left;  . CARPAL TUNNEL RELEASE  02/20/2012   Procedure: CARPAL TUNNEL RELEASE;  Surgeon: Nita Sells, MD;  Location: [facility];  Service: Orthopedics;  Laterality: Right;  . COLONOSCOPY    . CYSTO WITH HYDRODISTENSION N/A 07/16/2013   Procedure: CYSTOSCOPY/HYDRODISTENSION;  Surgeon: Reece Packer, MD;  Location: [facility];  Service:  Urology;  Laterality: N/A;  . DILATION AND CURETTAGE OF UTERUS    . FOOT SURGERY    . HYSTEROSCOPY W/D&C  10/31/2011   Procedure: DILATATION AND CURETTAGE /HYSTEROSCOPY;  Surgeon: Allyn Kenner, DO;  Location: [facility];  Service: Gynecology;  Laterality: N/A;  . LAPAROSCOPY N/A 07/16/2013   Procedure: LAPAROSCOPY DIAGNOSTIC;  Surgeon: Allyn Kenner, DO;  Location: [facility];  Service: Gynecology;  Laterality: N/A;  . PLANTAR FASCIA SURGERY    . PUBOVAGINAL SLING N/A 07/16/2013   Procedure: Gaynelle Arabian;  Surgeon: Reece Packer, MD;  Location: [facility];  Service: Urology;  Laterality: N/A;  . TUBAL LIGATION    . VAGINAL DELIVERY  1995,  2003   Past Medical History:  Diagnosis Date  . Chlamydia   . Diabetes ([place])   . GERD (gastroesophageal reflux disease)   . Hyperlipidemia   . Hypertension   . Migraine   . Obstructive sleep apnea on CPAP    pt has not worn CPAP in years  . Seasonal allergies   . Wears glasses    BP (!) 165/94   Pulse (!) 56   Temp (!) 97 F (36.1 C)   Ht 5\' 6"  (1.676 m)   Wt 213 lb (96.6 kg)   LMP 06/27/2013   SpO2 97%   BMI 34.38 kg/m   Opioid Risk Score:   Fall Risk Score:  `1  Depression screen PHQ 2/9  Depression screen [facility] 2/9 05/23/2018 03/07/2018 11/20/2017 05/25/2017  Decreased Interest 0 0 2 3  Down, Depressed, Hopeless 0 0 2 1  PHQ - 2 Score 0 0 4 4  Altered sleeping - - 3 2  Tired, decreased energy - - 2 3  Change in appetite - - 2 1  Feeling bad or failure about yourself  - - 3 0  Trouble concentrating - - 3 3  Moving slowly or fidgety/restless - - 2 0  Suicidal thoughts - - 1 0  PHQ-9 Score - - 20 13  Difficult doing work/chores - - Extremely dIfficult Not difficult at all     Review of Systems  Constitutional: Negative.   HENT: Negative.   Eyes: Negative.   Respiratory: Negative.   Cardiovascular: Negative.   Gastrointestinal: Negative.   Endocrine: Negative.   Genitourinary: Negative.   Musculoskeletal: Negative.   Skin:  Negative.   Allergic/Immunologic: Negative.   Neurological: Positive for headaches.  Hematological: Negative.   Psychiatric/Behavioral: Negative.   All other systems reviewed and are negative.      Objective:   Physical Exam  General: Alert and oriented x 3, No apparent distress. Does not appear to be in pain HEENT: Head is normocephalic, atraumatic, PERRLA, EOMI, sclera anicteric, oral mucosa pink and moist, dentition intact, ext ear canals  clear,  Neck: Supple without JVD or lymphadenopathy Heart: Reg rate and rhythm. No murmurs rubs or gallops Chest: CTA bilaterally without wheezes, rales, or rhonchi; no distress Abdomen: Soft, non-tender, non-distended, bowel sounds positive. Extremities: No clubbing, cyanosis, or edema. Pulses are 2+ Skin: Clean and intact without signs of breakdown Neuro: pt doesn't recall my name although she says I look familiar and with some further cueing states that she does in fact remember me. She recalls remote information but not recent data. Attention and focus appear better. Seems to be able to stay on task. Cranial nerves 2-12 are intact. Sensory exam is normal. Reflexes are 2+ in all 4's. Fine motor coordination is intact. No tremors. Motor function is grossly 5/5.  Musculoskeletal: Full ROM, No pain with AROM or PROM in the neck, trunk, or extremities. Posture appropriate Psych: pleasant and cooperative, seems to be in good spirits.          Assessment & Plan:  1. Post concussionsyndrome with ongoing headaches, memory loss, tinnitus, vestibular symptoms, insomnia and emotional lability 2.History of migraine headaches since her middle school years. Patient had been seen twice by neurology here in [place]. Apparently she has been on numerous medications in the past for prophylaxis and treatment 3. Uncontrolled hypertension 4. DM II    Plan: 1. Attention and focus are definitely improved. Memory remains a big problem still as does  her awareness. Given these issues, her return to work at some point does not look promising. 2. Continue with SLP to help develop ongoing HEP. 3. Maintain nortriptyline at 50 mg at bedtime for sleep. 4.Continue Aricept for memory 10mg  5.Continue to improve BP. It looks better today. Increase HCTZ to 25mg  daily   6. WILL Consider trial of Aimovig for headache prophylaxis.  Given her sleep issues, will begin a trial of topamax 25-50mg  qhs to help with migraine control. Place close attention to cognition  -fioricet for severe h/a 7.  consider cervical imaging as part of migraine work up. 8. RF ritalin 20mg  LA #30.  We will continue the controlled substance monitoring program, this consists of regular clinic visits, examinations, routine drug screening, pill counts as well as use of [place] Controlled Substance Reporting System. [place].    Follow-up with me in about 2 mos. Fifteen minutes of face to face patient care time were spent during this visit. All questions were encouraged and answered.  Marland Kitchen

## 2018-08-02 ENCOUNTER — Encounter: Payer: 59 | Admitting: Physical Therapy

## 2018-08-08 ENCOUNTER — Ambulatory Visit: Payer: No Typology Code available for payment source | Attending: Physical Medicine & Rehabilitation

## 2018-08-08 ENCOUNTER — Other Ambulatory Visit: Payer: Self-pay

## 2018-08-08 DIAGNOSIS — R41841 Cognitive communication deficit: Secondary | ICD-10-CM

## 2018-08-08 NOTE — Patient Instructions (Addendum)
Cognitive apps:  [employer] - Clinical cytogeneticist and Brain Games  Elevate ([employer])  [employer] - Brain Games and Training ([employer])    Memory Compensation Strategies  1. Use "WARM" strategy. W= write it down A=  associate it R=  repeat it M=  make a mental picture  2. You can keep a Social worker. Use a 3-ring notebook with sections for the following:  calendar, important names and phone numbers, medications, doctors' names/phone numbers, "to do list"/reminders, and a section to journal what you did each day  3. Use a calendar to write appointments down.  4. Write yourself a schedule for the day.  This can be placed on the calendar or in a separate section of the Memory Notebook.  Keeping a regular schedule can help memory.  5. Use medication organizer with sections for each day or morning/evening pills  You may need help loading it  6. Keep a basket, or pegboard by the door.   Place items that you need to take out with you in the basket or on the pegboard.  You may also want to include a message board for reminders.  7. Use sticky notes. Place sticky notes with reminders in a place where the task is performed.  For example:  "turn off the stove" placed by the stove, "lock the door" placed on the door at eye level, "take your medications" on the bathroom mirror or by the place where you normally take your medications  8. Use alarms/timers.  Use while cooking to remind yourself to check on food or as a reminder to take your medicine, or as a reminder to make a call, or as a reminder to perform another task, etc.

## 2018-08-08 NOTE — Therapy (Addendum)
[facility] [address] [place], [place], [address] Phone: [phone]   Fax:  [phone]  Speech Language Pathology Treatment  Patient Details  Name: Bridget Anderson MRN: [id number] Date of Birth: November 15, 1970 Referring Provider (SLP): Alger Simons, MD   Encounter Date: 08/08/2018  End of Session - 08/08/18 1149    Visit Number  12    Number of Visits  25    Date for SLP Re-Evaluation  10/17/18    Authorization Type  workmen's comp    Authorization - Number of Visits  25    SLP Start Time  1005    SLP Stop Time   1045    SLP Time Calculation (min)  40 min    Activity Tolerance  Patient tolerated treatment well       Past Medical History:  Diagnosis Date  . Chlamydia   . Diabetes ([place])   . GERD (gastroesophageal reflux disease)   . Hyperlipidemia   . Hypertension   . Migraine   . Obstructive sleep apnea on CPAP    pt has not worn CPAP in years  . Seasonal allergies   . Wears glasses     Past Surgical History:  Procedure Laterality Date  . ABDOMINAL HYSTERECTOMY N/A 07/16/2013   Procedure: HYSTERECTOMY ABDOMINAL;  Surgeon: Allyn Kenner, DO;  Location: [facility];  Service: Gynecology;  Laterality: N/A;  . BILATERAL SALPINGECTOMY Bilateral 07/16/2013   Procedure: BILATERAL SALPINGECTOMY;  Surgeon: Allyn Kenner, DO;  Location: [facility];  Service: Gynecology;  Laterality: Bilateral;  . CARPAL TUNNEL RELEASE  01/16/2012   Procedure: CARPAL TUNNEL RELEASE;  Surgeon: Nita Sells, MD;  Location: [facility];  Service: Orthopedics;  Laterality: Left;  . CARPAL TUNNEL RELEASE  02/20/2012   Procedure: CARPAL TUNNEL RELEASE;  Surgeon: Nita Sells, MD;  Location: [facility];  Service: Orthopedics;  Laterality: Right;  . COLONOSCOPY    . CYSTO WITH HYDRODISTENSION N/A 07/16/2013   Procedure: CYSTOSCOPY/HYDRODISTENSION;  Surgeon: Reece Packer, MD;  Location: [facility];  Service: Urology;  Laterality: N/A;  . DILATION AND CURETTAGE OF UTERUS    . FOOT SURGERY    . HYSTEROSCOPY W/D&C  10/31/2011   Procedure: DILATATION AND CURETTAGE /HYSTEROSCOPY;  Surgeon: Allyn Kenner, DO;  Location: [facility];  Service: Gynecology;  Laterality: N/A;  . LAPAROSCOPY N/A 07/16/2013   Procedure: LAPAROSCOPY DIAGNOSTIC;  Surgeon: Allyn Kenner, DO;  Location: [facility];  Service: Gynecology;  Laterality: N/A;  . PLANTAR FASCIA SURGERY    . PUBOVAGINAL SLING N/A 07/16/2013   Procedure: Gaynelle Arabian;  Surgeon: Reece Packer, MD;  Location: [facility];  Service: Urology;  Laterality: N/A;  . TUBAL LIGATION    . [place],  2003    There were no vitals filed for this visit.  Subjective Assessment - 08/08/18 1132    Subjective  "You look familiar to me. You know me. Tell (Dr. Naaman Plummer) I can go back to work."    Patient is accompained by:  Family member   mother, and sister Bridget Anderson)           ADULT SLP TREATMENT - 08/08/18 1133      General Information   Behavior/Cognition  Confused;Alert;Cooperative      Treatment Provided   Treatment provided  Cognitive-Linquistic      Cognitive-Linquistic Treatment   Treatment focused on  Cognition    Skilled Treatment  SLP stated purpose of today's session was  to review/educate caregivers of Bridget Anderson on compensations for memory and the need for routine. SLP provided handout and highlighted memory notebook and need for dividers. SLP suggested dividers of "daily planning sheets", "daily journal", "to-do-list", "MD questions/questions to ask" to start.  SLP suggested pt and family sit down each night and pt writes down 1-2 things she wants to complete for the following day, and develop a schedule for each day with items from her to-do list and items/things she wants to do, then puts that schedule on the refrigerator for the following day, to follow. SLP suggested that pt take some time each day for brain tasks - apps,  or daily activities to stretch her cognition. 30 minutes into the session pt stated she wanted to go back to work and asked SLP to "write me a note to go back to work." SLP referred to Dr. Naaman Plummer visit last week and stated Dr. Naaman Plummer would have to write her a note. Sister said Dr. Naaman Plummer stated that pt would likely not return to work. Pt became upset asking why shouldn't or couldn't she return to work - SLP told pt rationale (significant memory deficits). Pt refused the severity of deficits, saying "I could do my job." Remainder of sesison was spent telling pt reasons why, from her chart, returning to work would be difficult for her (Dr. Sima Matas evaluation results, ST 10 times and pt doesn't recognize SLP, pt prognosis given time post-TBI, etc.). SLP talked about other options including vocational rehab assisting pt and pt stated, "You can't make me do anything i don't want to do." AT that time, SLP returned session focus to talking about pt's planner/notebook as well as apps that pt could do during the day. Pt/family agreed to return in approx 2 weeks to discuss how the compensations are going and problem solve how to modify/tweak compensations to benefit pt.       Assessment / Recommendations / Plan   Plan  Continue with current plan of care      Progression Toward Goals   Progression toward goals  Progressing toward goals       SLP Education - 08/08/18 1149    Education Details  memory notebook and how to structure and use to benefit pt, compensations for memory    Person(s) Educated  Patient;Parent(s);Caregiver(s)    Methods  Explanation    Comprehension  Verbalized understanding;Need further instruction       SLP Short Term Goals - 05/08/18 1311      SLP SHORT TERM GOAL #1   Title  pt will demo use of memory enhancing system outside of ST session (i.e., writing down items/entries in a memory journal) between 4 sessions to enhance or provide a means of functional memory for pt     Baseline  04-10-18, 04-25-18    Status  Partially Met   from 8 sessions to 4     SLP [place] #2   Title  pt will tell SLP 3 practical ramifications of her cognitive deficits with modified independence (i.e., written notes) in 4 sessions    Status  Not Met      SLP SHORT TERM GOAL #3   Title  pt will give adequate synopsis of salient written material she enjoys with modified independence over two sessions    Status  Deferred   to work on memory compensations for functional pt tasks      SLP Long Term Goals - 08/08/18 1152      SLP  LONG TERM GOAL #1   Title  pt will demo appropriate usage of a memory enhancement/compensation system between 3 ST sessions    Time  4    Period  --   sessions   Status  On-going   from " between 10 ST sessions"     SLP LONG TERM GOAL #2   Title  pt's family and/or caregivers will report benefit from education in order to improve pt's independence    Time  4    Period  --   sessions   Status  On-going       Plan - 08/08/18 1150    Clinical Impression Statement  Pt arrived with mother and sister, to to build some consistency in cueing and education how best to work with pt, given her profound memory deficit via Glass blower/designer.See "skilled intervention" for details.  Pt continues with profound memory encoding deficits. Pt did not bring planner today as she left it in her older daughter's car who lives in [place], [place]. In this SLP's opinion pt will not ever be able to return to work in her same capacity. See skilled intervention for further details about today's visit.    Speech Therapy Frequency  --   once next week or the following week, once in two more weeks, then once approx every four weeks for 1 or 2 more visits   Duration  --   total of no more than 4 more sessions   Treatment/Interventions  Compensatory strategies;Patient/family education;Functional tasks;Cueing hierarchy;Cognitive reorganization;Environmental  controls;Internal/external aids;SLP instruction and feedback   family/caregiver education   Potential to Achieve Goals  Fair    Potential Considerations  Cooperation/participation level;Severity of impairments;Ability to learn/carryover information       Patient will benefit from skilled therapeutic intervention in order to improve the following deficits and impairments:   1. Cognitive communication deficit       Problem List Patient Active Problem List   Diagnosis Date Noted  . Anterograde amnesia   . Postconcussive syndrome 01/01/2018  . Asthma   . Essential hypertension   . Diabetes mellitus type 2 in obese ([place])   . Dyslipidemia   . Vascular headache   . Chronic post-traumatic headache, not intractable 11/20/2017  . Shortness of breath on exertion 05/25/2017  . Other fatigue 05/25/2017  . Type 2 diabetes mellitus without complication, without long-term current use of insulin ([place]) 05/25/2017  . Uncontrolled hypertension 05/25/2017  . Chronic migraine without aura, with intractable migraine, so stated, with status migrainosus 03/20/2017  . S/P hysterectomy 07/16/2013    [facility] ,MS, CCC-SLP  08/08/2018, 11:53 AM  [facility] [address] [place], [place], [address] Phone: [phone]   Fax:  [phone]   Name: Bridget Anderson MRN: [id number] Date of Birth: May 08, 1970

## 2018-08-09 ENCOUNTER — Encounter: Payer: 59 | Admitting: Occupational Therapy

## 2018-08-09 ENCOUNTER — Encounter: Payer: 59 | Admitting: Physical Therapy

## 2018-08-16 ENCOUNTER — Encounter: Payer: 59 | Admitting: Physical Therapy

## 2018-08-16 ENCOUNTER — Encounter: Payer: 59 | Admitting: Occupational Therapy

## 2018-08-27 ENCOUNTER — Ambulatory Visit: Payer: 59

## 2018-09-11 ENCOUNTER — Telehealth: Payer: Self-pay | Admitting: Physical Medicine & Rehabilitation

## 2018-09-11 NOTE — Telephone Encounter (Signed)
Nurse case manager Bridget Anderson with [employer], would like for Dr. Naaman Anderson to consider patient having a new MRI since her previous one to see how she is doing.  Bridget Anderson is aware that only patients are allowed in clinic. If there are any questions please call her at [phone].

## 2018-09-11 NOTE — Telephone Encounter (Signed)
I'm willing to order but I would forewarn her that it's not likely to show very much as there was not much on the scan dated 12/19/17

## 2018-09-19 ENCOUNTER — Other Ambulatory Visit: Payer: Self-pay

## 2018-09-19 ENCOUNTER — Ambulatory Visit: Payer: No Typology Code available for payment source | Attending: Physical Medicine & Rehabilitation

## 2018-09-19 DIAGNOSIS — R41841 Cognitive communication deficit: Secondary | ICD-10-CM | POA: Insufficient documentation

## 2018-09-19 NOTE — Patient Instructions (Signed)
   Planner sections: Blood pressure/ blood sugar tracking Meals-  For meal planning and for shopping Journal -   New day, new page Doctors- for questions for Page To Do- Daily/weekly/monthly

## 2018-09-19 NOTE — Therapy (Signed)
[facility] [address] [place], [place], [address] Phone: [phone]   Fax:  [phone]  Speech Language Pathology Treatment  Patient Details  Name: Bridget Anderson MRN: [id number] Date of Birth: April 20, 1970 Referring Provider (SLP): Alger Simons, MD   Encounter Date: 09/19/2018  End of Session - 09/19/18 1633    Visit Number  7    Number of Visits  25    Date for SLP Re-Evaluation  10/17/18    Authorization - Visit Number  11    Authorization - Number of Visits  25    SLP Start Time  1410   5 mintues late   SLP Stop Time   5697    SLP Time Calculation (min)  35 min    Activity Tolerance  Patient tolerated treatment well       Past Medical History:  Diagnosis Date  . Chlamydia   . Diabetes ([place])   . GERD (gastroesophageal reflux disease)   . Hyperlipidemia   . Hypertension   . Migraine   . Obstructive sleep apnea on CPAP    pt has not worn CPAP in years  . Seasonal allergies   . Wears glasses     Past Surgical History:  Procedure Laterality Date  . ABDOMINAL HYSTERECTOMY N/A 07/16/2013   Procedure: HYSTERECTOMY ABDOMINAL;  Surgeon: Allyn Kenner, DO;  Location: [facility];  Service: Gynecology;  Laterality: N/A;  . BILATERAL SALPINGECTOMY Bilateral 07/16/2013   Procedure: BILATERAL SALPINGECTOMY;  Surgeon: Allyn Kenner, DO;  Location: [facility];  Service: Gynecology;  Laterality: Bilateral;  . CARPAL TUNNEL RELEASE  01/16/2012   Procedure: CARPAL TUNNEL RELEASE;  Surgeon: Nita Sells, MD;  Location: [facility];  Service: Orthopedics;  Laterality: Left;  . CARPAL TUNNEL RELEASE  02/20/2012   Procedure: CARPAL TUNNEL RELEASE;  Surgeon: Nita Sells, MD;  Location: [facility];  Service: Orthopedics;  Laterality: Right;  . COLONOSCOPY    . CYSTO WITH HYDRODISTENSION N/A 07/16/2013   Procedure: CYSTOSCOPY/HYDRODISTENSION;  Surgeon: Reece Packer, MD;   Location: [facility];  Service: Urology;  Laterality: N/A;  . DILATION AND CURETTAGE OF UTERUS    . FOOT SURGERY    . HYSTEROSCOPY W/D&C  10/31/2011   Procedure: DILATATION AND CURETTAGE /HYSTEROSCOPY;  Surgeon: Allyn Kenner, DO;  Location: [facility];  Service: Gynecology;  Laterality: N/A;  . LAPAROSCOPY N/A 07/16/2013   Procedure: LAPAROSCOPY DIAGNOSTIC;  Surgeon: Allyn Kenner, DO;  Location: [facility];  Service: Gynecology;  Laterality: N/A;  . PLANTAR FASCIA SURGERY    . PUBOVAGINAL SLING N/A 07/16/2013   Procedure: Gaynelle Arabian;  Surgeon: Reece Packer, MD;  Location: [facility];  Service: Urology;  Laterality: N/A;  . TUBAL LIGATION    . [place],  2003    There were no vitals filed for this visit.  Subjective Assessment - 09/19/18 1415    Subjective  "Maybe next week I'll get things straight and be able to go back to work."    Patient is accompained by:  Family member   Bridget Anderson   Currently in Pain?  Yes    Pain Score  5     Pain Location  Head    Pain Orientation  Right;Posterior;Upper    Pain Descriptors / Indicators  Headache    Pain Type  Chronic pain    Pain Onset  More than a month ago    Pain Frequency  Constant    Pain  Relieving Factors  medicine    Effect of Pain on Daily Activities  personality changes            ADULT SLP TREATMENT - 09/19/18 1625      General Information   Behavior/Cognition  Confused;Alert;Cooperative;Pleasant mood;Distractible      Treatment Provided   Treatment provided  Cognitive-Linquistic      Cognitive-Linquistic Treatment   Treatment focused on  Cognition    Skilled Treatment Pt did not bring her memory device. SLP re-stated today's purpose - to introduce sections of pt's memory notebook/binder/planner due to pt's memory deficit. Pt told SLP 4 minutes later that she "(has) memory probelms so something like this would help." SLP, pt, pt sister talked about each section in the planner that would be ehlpful for pt.  SLP explained that appt in two weeks is to modify and problem solve areas or situations that were challenging. Pt cont to make comments throughout session that indicate limited awareness and insight into extent of her injury/deficits. SLP suggested how to structure the memory binder so that it would be as easy for pt to navigate as possilbe. SLP also shared that pt will remember best by procedural memory so [employer]. SLP told pt/sister next session we would assess current usage success and modify pt's memory binder as necessary. SLP made it clear to pt/sister to bring her memory binder next session.     Assessment / Recommendations / Plan   Plan  Continue with current plan of care      Progression Toward Goals   Progression toward goals  Progressing toward goals       SLP Education - 09/19/18 1632    Education Details  memory binder/notebook suggestions for use and sections of binder, procedural memory is best way for pt to recall at this time, consistency in binder use is essential    Person(s) Educated  Patient;Caregiver(s)    Methods  Explanation;Demonstration    Comprehension  Verbalized understanding;Need further instruction       SLP Short Term Goals - 05/08/18 1311      SLP SHORT TERM GOAL #1   Title  pt will demo use of memory enhancing system outside of ST session (i.e., writing down items/entries in a memory journal) between 4 sessions to enhance or provide a means of functional memory for pt    Baseline  04-10-18, 04-25-18    Status  Partially Met   from 8 sessions to 4     SLP [place] #2   Title  pt will tell SLP 3 practical ramifications of her cognitive deficits with modified independence (i.e., written notes) in 4 sessions    Status  Not Met      SLP SHORT TERM GOAL #3   Title  pt will give adequate synopsis of salient written material she enjoys with modified independence over two sessions    Status  Deferred   to work on memory compensations for  functional pt tasks      SLP Long Term Goals - 09/19/18 [place] #1   Title  pt will demo appropriate usage of a memory enhancement/compensation system between 3 ST sessions    Time  3    Period  --   sessions   Status  On-going   from " between 10 ST sessions"     SLP LONG TERM GOAL #2   Title  pt's family and/or caregivers will  report benefit from education in order to improve pt's independence    Time  3    Period  --   sessions   Status  On-going       Plan - 09/19/18 1635    Clinical Impression Statement  Pt arrived with sister, to continue education how best to work with pt via memory compensations/notebook/planner.See "skilled intervention" for details. Pt continues with profound memory encoding deficits.  In this SLP's opinion pt will not ever be able to return to work in her same capacity. She and family would benefit from 1-2 more sessions, at most, to educate and problem solve how they can work together at using memory compensations to help pt be as independent as possible.    Speech Therapy Frequency  --   once next week or the following week, once in two more weeks, then once approx every four weeks for 1 or 2 more visits   Duration  --   total of no more than 4 more sessions   Treatment/Interventions  Compensatory strategies;Patient/family education;Functional tasks;Cueing hierarchy;Cognitive reorganization;Environmental controls;Internal/external aids;SLP instruction and feedback   family/caregiver education   Potential to Achieve Goals  Fair    Potential Considerations  Cooperation/participation level;Severity of impairments;Ability to learn/carryover information       Patient will benefit from skilled therapeutic intervention in order to improve the following deficits and impairments:   1. Cognitive communication deficit       Problem List Patient Active Problem List   Diagnosis Date Noted  . Anterograde amnesia   . Postconcussive  syndrome 01/01/2018  . Asthma   . Essential hypertension   . Diabetes mellitus type 2 in obese ([place])   . Dyslipidemia   . Vascular headache   . Chronic post-traumatic headache, not intractable 11/20/2017  . Shortness of breath on exertion 05/25/2017  . Other fatigue 05/25/2017  . Type 2 diabetes mellitus without complication, without long-term current use of insulin ([place]) 05/25/2017  . Uncontrolled hypertension 05/25/2017  . Chronic migraine without aura, with intractable migraine, so stated, with status migrainosus 03/20/2017  . S/P hysterectomy 07/16/2013    [facility] ,[place], CCC-SLP  09/19/2018, 4:39 PM  [facility] [address] [place], [place], [address] Phone: [phone]   Fax:  [phone]   Name: Bridget Anderson MRN: [id number] Date of Birth: 1970/06/30

## 2018-10-03 ENCOUNTER — Encounter
Payer: No Typology Code available for payment source | Attending: Physical Medicine & Rehabilitation | Admitting: Physical Medicine & Rehabilitation

## 2018-10-03 ENCOUNTER — Ambulatory Visit: Payer: No Typology Code available for payment source

## 2018-10-03 ENCOUNTER — Encounter: Payer: Self-pay | Admitting: Physical Medicine & Rehabilitation

## 2018-10-03 ENCOUNTER — Other Ambulatory Visit: Payer: Self-pay

## 2018-10-03 ENCOUNTER — Other Ambulatory Visit: Payer: Self-pay | Admitting: Physical Medicine & Rehabilitation

## 2018-10-03 VITALS — BP 169/119 | HR 79 | Temp 98.8°F | Ht 66.0 in | Wt 214.0 lb

## 2018-10-03 DIAGNOSIS — Z79891 Long term (current) use of opiate analgesic: Secondary | ICD-10-CM | POA: Insufficient documentation

## 2018-10-03 DIAGNOSIS — I10 Essential (primary) hypertension: Secondary | ICD-10-CM

## 2018-10-03 DIAGNOSIS — Z5181 Encounter for therapeutic drug level monitoring: Secondary | ICD-10-CM

## 2018-10-03 DIAGNOSIS — G441 Vascular headache, not elsewhere classified: Secondary | ICD-10-CM | POA: Insufficient documentation

## 2018-10-03 DIAGNOSIS — G43711 Chronic migraine without aura, intractable, with status migrainosus: Secondary | ICD-10-CM | POA: Insufficient documentation

## 2018-10-03 DIAGNOSIS — F0781 Postconcussional syndrome: Secondary | ICD-10-CM

## 2018-10-03 DIAGNOSIS — Z79899 Other long term (current) drug therapy: Secondary | ICD-10-CM | POA: Insufficient documentation

## 2018-10-03 MED ORDER — AIMOVIG 70 MG/ML ~~LOC~~ SOAJ: 70.0000 mg | SUBCUTANEOUS | 2 refills | Status: DC

## 2018-10-03 MED ORDER — NORTRIPTYLINE HCL 50 MG PO CAPS: 100.0000 mg | ORAL_CAPSULE | Freq: Every day | ORAL | 3 refills | Status: DC

## 2018-10-03 MED ORDER — METHYLPHENIDATE HCL ER (LA) 20 MG PO CP24: 20.0000 mg | ORAL_CAPSULE | ORAL | 0 refills | Status: DC

## 2018-10-03 NOTE — Telephone Encounter (Signed)
Need to go to PCP

## 2018-10-03 NOTE — Addendum Note (Signed)
Addended by: Jasmine December T on: 10/03/2018 01:09 PM   Modules accepted: Orders

## 2018-10-03 NOTE — Progress Notes (Signed)
Subjective:    Patient ID: Bridget Anderson, female    DOB: 1970-07-08, 48 y.o.   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  I last saw her 2 months ago.  She is accompanied by her sister once again.  Headaches remain an issue.  Sister states that she is using a calendar at home yet she did not have it with her today.  She is not sleeping at night as she is often on Facebook or some sort of media.  She only took 25 mg of the Topamax.  Today Bridget Anderson recalled having been on Topamax previously without much benefit and actually had some more pain in her arms and shoulders due to the medication apparently.  She is taking her blood pressure as instructed except for Lotensin which she is only using 1-1/2 tablets of daily.  She claims to have taken her medications this morning.  Headaches remain a 7 out of 10 are often burning and stabbing in nature.  Again Bridget Anderson asked about going back to work.  I was direct with her and asked her why she thought she could go back and she recorder applied that she had been working there for 12 years and knows the job.  Pain Inventory Average Pain 7 Pain Right Now 5 My pain is sharp, burning, stabbing and tingling  In the last 24 hours, has pain interfered with the following? General activity 3 Relation with others 4 Enjoyment of life 4 What TIME of day is your pain at its worst? all Sleep (in general) Poor  Pain is worse with: unsure Pain improves with: meds Relief from Meds: 3  Mobility walk without assistance  Function employed # of hrs/week 48hrs  Neuro/Psych No problems in this area  Prior Studies Any changes since last visit?  no  Physicians involved in your care Any changes since last visit?  no   Family History  Problem Relation Age of Onset   Hypertension Mother    Hyperlipidemia Mother    Hypertension Father    Hyperlipidemia Father    Thyroid disease Father    Hypertension Brother    Social  History   Socioeconomic History   Marital status: Single    Spouse name: Not on file   Number of children: 2   Years of education: Not on file   Highest education level: Associate degree: academic program  Occupational History   Occupation: Haematologist strain: Not on file   Food insecurity    Worry: Not on file    Inability: Not on file   Transportation needs    Medical: Not on file    Non-medical: Not on file  Tobacco Use   Smoking status: Never Smoker   Smokeless tobacco: Never Used  Substance and Sexual Activity   Alcohol use: Yes    Comment: Rarely   Drug use: No   Sexual activity: Yes    Birth control/protection: Surgical  Lifestyle   Physical activity    Days per week: Not on file    Minutes per session: Not on file   Stress: Not on file  Relationships   Social connections    Talks on phone: Not on file    Gets together: Not on file    Attends religious service: Not on file    Active member of club or organization: Not on file    Attends meetings of  clubs or organizations: Not on file    Relationship status: Not on file  Other Topics Concern   Not on file  Social History Narrative   Lives at home with her daughter   Right handed   Drinks 3 cups of caffeine daily   Past Surgical History:  Procedure Laterality Date   ABDOMINAL HYSTERECTOMY N/A 07/16/2013   Procedure: HYSTERECTOMY ABDOMINAL;  Surgeon: Allyn Kenner, DO;  Location: [facility];  Service: Gynecology;  Laterality: N/A;   BILATERAL SALPINGECTOMY Bilateral 07/16/2013   Procedure: BILATERAL SALPINGECTOMY;  Surgeon: Allyn Kenner, DO;  Location: [facility];  Service: Gynecology;  Laterality: Bilateral;   CARPAL TUNNEL RELEASE  01/16/2012   Procedure: CARPAL TUNNEL RELEASE;  Surgeon: Nita Sells, MD;  Location: [facility];  Service: Orthopedics;  Laterality: Left;   CARPAL TUNNEL RELEASE  02/20/2012   Procedure: CARPAL  TUNNEL RELEASE;  Surgeon: Nita Sells, MD;  Location: [facility];  Service: Orthopedics;  Laterality: Right;   COLONOSCOPY     CYSTO WITH HYDRODISTENSION N/A 07/16/2013   Procedure: CYSTOSCOPY/HYDRODISTENSION;  Surgeon: Reece Packer, MD;  Location: [facility];  Service: Urology;  Laterality: N/A;   DILATION AND CURETTAGE OF UTERUS     FOOT SURGERY     HYSTEROSCOPY W/D&C  10/31/2011   Procedure: DILATATION AND CURETTAGE /HYSTEROSCOPY;  Surgeon: Allyn Kenner, DO;  Location: [facility];  Service: Gynecology;  Laterality: N/A;   LAPAROSCOPY N/A 07/16/2013   Procedure: LAPAROSCOPY DIAGNOSTIC;  Surgeon: Allyn Kenner, DO;  Location: [facility];  Service: Gynecology;  Laterality: N/A;   PLANTAR FASCIA SURGERY     PUBOVAGINAL SLING N/A 07/16/2013   Procedure: Gaynelle Arabian;  Surgeon: Reece Packer, MD;  Location: [facility];  Service: Urology;  Laterality: N/A;   TUBAL LIGATION     VAGINAL DELIVERY  1995,  2003   Past Medical History:  Diagnosis Date   Chlamydia    Diabetes ([place])    GERD (gastroesophageal reflux disease)    Hyperlipidemia    Hypertension    Migraine    Obstructive sleep apnea on CPAP    pt has not worn CPAP in years   Seasonal allergies    Wears glasses    BP (!) 154/99    Pulse 79    Temp 98.8 F (37.1 C)    Ht 5\' 6"  (1.676 m)    Wt 214 lb (97.1 kg)    LMP 06/27/2013    SpO2 97%    BMI 34.54 kg/m   Opioid Risk Score:   Fall Risk Score:  `1  Depression screen PHQ 2/9  Depression screen [facility] 2/9 05/23/2018 03/07/2018 11/20/2017 05/25/2017  Decreased Interest 0 0 2 3  Down, Depressed, Hopeless 0 0 2 1  PHQ - 2 Score 0 0 4 4  Altered sleeping - - 3 2  Tired, decreased energy - - 2 3  Change in appetite - - 2 1  Feeling bad or failure about yourself  - - 3 0  Trouble concentrating - - 3 3  Moving slowly or fidgety/restless - - 2 0  Suicidal thoughts - - 1 0  PHQ-9 Score - - 20 13  Difficult doing work/chores - - Extremely  dIfficult Not difficult at all     Review of Systems  Constitutional: Negative.   HENT:       Pain on right TMJ area  Eyes: Negative.   Respiratory: Negative.   Cardiovascular: Negative.   Gastrointestinal: Negative.  Endocrine: Negative.   Genitourinary: Negative.   Musculoskeletal: Negative.   Skin: Negative.   Allergic/Immunologic: Negative.   Neurological: Negative.   Hematological: Negative.   Psychiatric/Behavioral: Negative.   All other systems reviewed and are negative.      Objective:   Physical Exam General: Alert and oriented x 3, No apparent distress. Does not appear to be in pain HEENT: Head is normocephalic, atraumatic, PERRLA, EOMI, sclera anicteric, oral mucosa pink and moist, dentition intact, ext ear canals clear,  Neck: Supple without JVD or lymphadenopathy Heart: Reg rate and rhythm. No murmurs rubs or gallops Chest: CTA bilaterally without wheezes, rales, or rhonchi; no distress Abdomen: Soft, non-tender, non-distended, bowel sounds positive. Extremities: No clubbing, cyanosis, or edema. Pulses are 2+ Skin: Clean and intact without signs of breakdown Neuro: still doesn't recognize me, know what I do or know where she is. 37 old information.  Attention and focus appear better. Seems to be able to stay on task. Cranial nerves 2-12 are intact. Sensory exam is normal. Reflexes are 2+ in all 4's. Fine motor coordination is intact. No tremors. Motor function is grossly 5/5.  Musculoskeletal: Full ROM, No pain with AROM or PROM in the neck, trunk, or extremities. Posture appropriate Psych:pleasant, less anxious. Engages more.          Assessment & Plan:  1. Post concussionsyndrome with ongoing headaches, memory loss, tinnitus, vestibular symptoms, insomnia and emotional lability 2.History of migraine headaches since her middle school years. Patient had been seen twice by neurology here in [place]. Apparently she has been on numerous  medications in the past for prophylaxis and treatment 3. Uncontrolled hypertension 4. DM II    Plan: 1. Attention and focus remain improved. Memory and awareness are very lacking. Tried to be very straight forward with her today in this regard.   -WC case manager asked about repeat MRI of the brain. I don't see how this would be indicated at this point. She hasn't improved from a memory stand point but certainly hasn't worsened. The last MRI was also not done acutely after the injury. By the time it was performed it should have demonstrated any radiographical evidence of chronic changes d/t her injury 2. Continue with SLP to help develop ongoing HEP. 3. discussed the need for regular use of calendar, schedule, organizer at ALL times. This is not happening regularly.  -Also discussed at length the importance of better sleep hygiene.  She needs to be in bed before midnight and setting up an environment to help her sleep.  This means staying on Facebook and Camera operator. 4.Continue Aricept for memory 10mg  5.Continue to improve BP.continue HCTZ to 25mg  daily as well as metoprolol and lotensin which needs to be taken bid.  6.BEGIN  trial of Aimovig for headache prophylaxis.70 mg monthly -For sleep and headache increase nortriptyline to 100mg  qhs       -fioricet for severe h/a 7.consider cervical imaging as part of migraine work up. 8. RF ritalin 20mg  LA #30.  .We will continue the controlled substance monitoring program, this consists of regular clinic visits, examinations, routine drug screening, pill counts as well as use of [place] Controlled Substance Reporting System. [place] today.      Follow-up with me in about 2 mos. Fifteen minutes of face to face patient care time were spent during this visit. All questions were encouraged and answered. discussion included WC case manager as well.

## 2018-10-03 NOTE — Patient Instructions (Addendum)
YOU NEED TO GO TO SLEEP AT A REGULAR HOUR EACH NIGHT. TRY TO BE ASLEEP BY MIDNIGHT. WORK ON GOOD 'HYGIENE'  STAY OFF FACE BOOK AND THINGS WHICH WILL KEEP YOU AWAKE.  I WANT YOU TO UTILIZE ORGANIZATIONAL STRATEGIES SUCH AS A DAILY CALENDAR OR ORGANIZER TO HELP WITH YOUR DAY TO DAY MEMORY.  STOP TOPAMAX  TALK TO DR. VYAS ABOUT YOUR BLOOD PRESSURE. FOR NOW TAKE LOTENSIN TWICE DAILY AS PRESCRIBED.

## 2018-10-06 LAB — DRUG TOX MONITOR 1 W/CONF, ORAL FLD
Amobarbital: NEGATIVE ng/mL (ref <10)
Amphetamines: NEGATIVE ng/mL (ref <10)
Barbiturates: POSITIVE ng/mL — AB (ref <10)
Benzodiazepines: NEGATIVE ng/mL (ref <0.50)
Buprenorphine: NEGATIVE ng/mL (ref <0.10)
Butalbital: 14 ng/mL — ABNORMAL HIGH (ref <10)
Cocaine: NEGATIVE ng/mL (ref <5.0)
Fentanyl: NEGATIVE ng/mL (ref <0.10)
Heroin Metabolite: NEGATIVE ng/mL (ref <1.0)
MARIJUANA: NEGATIVE ng/mL (ref <2.5)
MDMA: NEGATIVE ng/mL (ref <10)
Meprobamate: NEGATIVE ng/mL (ref <2.5)
Methadone: NEGATIVE ng/mL (ref <5.0)
Nicotine Metabolite: NEGATIVE ng/mL (ref <5.0)
Opiates: NEGATIVE ng/mL (ref <2.5)
Pentobarbital: NEGATIVE ng/mL (ref <10)
Phencyclidine: NEGATIVE ng/mL (ref <10)
Phenobarbital: NEGATIVE ng/mL (ref <10)
Secobarbital: NEGATIVE ng/mL (ref <10)
Tapentadol: NEGATIVE ng/mL (ref <5.0)
Tramadol: NEGATIVE ng/mL (ref <5.0)
Zolpidem: NEGATIVE ng/mL (ref <5.0)

## 2018-10-06 LAB — DRUG TOX METHYLPHEN W/CONF,ORAL FLD
Methylphenidate: 7.4 ng/mL — ABNORMAL HIGH (ref <1.0)
Methylphenidate: POSITIVE ng/mL — AB (ref <1.0)

## 2018-10-06 LAB — DRUG TOX ALC METAB W/CON, ORAL FLD: Alcohol Metabolite: NEGATIVE ng/mL (ref <25)

## 2018-10-11 ENCOUNTER — Telehealth: Payer: Self-pay | Admitting: *Deleted

## 2018-10-11 NOTE — Telephone Encounter (Signed)
Oral swab drug screen was consistent for prescribed medications methylphenidate and butalbital.

## 2018-12-12 ENCOUNTER — Encounter: Payer: No Typology Code available for payment source | Admitting: Physical Medicine & Rehabilitation

## 2018-12-26 ENCOUNTER — Encounter: Payer: No Typology Code available for payment source | Admitting: Physical Medicine & Rehabilitation

## 2019-01-30 ENCOUNTER — Other Ambulatory Visit: Payer: Self-pay

## 2019-01-30 ENCOUNTER — Encounter
Payer: No Typology Code available for payment source | Attending: Physical Medicine & Rehabilitation | Admitting: Physical Medicine & Rehabilitation

## 2019-01-30 ENCOUNTER — Encounter: Payer: Self-pay | Admitting: Physical Medicine & Rehabilitation

## 2019-01-30 VITALS — BP 136/88 | HR 78 | Temp 97.7°F | Ht 66.0 in | Wt 210.0 lb

## 2019-01-30 DIAGNOSIS — F0781 Postconcussional syndrome: Secondary | ICD-10-CM | POA: Diagnosis present

## 2019-01-30 DIAGNOSIS — G43711 Chronic migraine without aura, intractable, with status migrainosus: Secondary | ICD-10-CM | POA: Insufficient documentation

## 2019-01-30 DIAGNOSIS — G44329 Chronic post-traumatic headache, not intractable: Secondary | ICD-10-CM | POA: Diagnosis present

## 2019-01-30 MED ORDER — DICLOFENAC SODIUM 50 MG PO TBEC: 50.0000 mg | DELAYED_RELEASE_TABLET | Freq: Two times a day (BID) | ORAL | 3 refills | Status: DC

## 2019-01-30 MED ORDER — BUTALBITAL-APAP-CAFFEINE 50-325-40 MG PO TABS: 1.0000 | ORAL_TABLET | Freq: Four times a day (QID) | ORAL | 1 refills | Status: AC | PRN

## 2019-01-30 NOTE — Patient Instructions (Signed)
PLEASE FEEL FREE TO CALL OUR OFFICE WITH ANY PROBLEMS OR QUESTIONS ([phone])      

## 2019-01-30 NOTE — Progress Notes (Signed)
Subjective:    Patient ID: Bridget Anderson, female    DOB: 1970-11-01, 48 y.o.   MRN: [id number]  HPI   Bridget Anderson is here in follow up of her PCS and associated deficits. Since I last saw her she has been placed in a RLE walking boot and has received injections for problems related to prior tarsal tunnel syndrome/ bone spurs. She has been in the walking boot for about 2.5 weeks per sister.   Sister reports that not much has changed from a cognitive and behavioral standpoint. Her blood pressure has been better for the most part although the DBP can still be elevated at times. She is not receiving medication consistently still d/t concerns over feeling "dizzy" or that bp is too low. Sister and niece remain to be her primary caregivers and supervision  Bridget Anderson states that she is having chronic headache still.  Headaches are diffuse in location and are more severe.  She has been out of Fioricet for a while.  She uses ibuprofen for pain.  Aimovig was not covered and so she is not using this.  Bridget Anderson asked about getting Botox injections as a means of relief.  Sister states that she still using Ritalin for attention, even though this was last prescribed in September.  It appears that she is not taking it every day as intended.  Bridget Anderson continues to ask about returning to work.  She asked me today "when can I return to work?  "She feels that she is still can do her job.     Pain Inventory Average Pain 7 Pain Right Now 5 My pain is constant  In the last 24 hours, has pain interfered with the following? General activity n/a Relation with others n/a Enjoyment of life n/a What TIME of day is your pain at its worst? all Sleep (in general) Poor  Pain is worse with: walking and standing Pain improves with: rest Relief from Meds: n/a  Mobility walk without assistance Do you have any goals in this area?  no  Function employed # of hrs/week 36-48  Neuro/Psych No problems in this area  Prior Studies  Any changes since last visit?  no  Physicians involved in your care Any changes since last visit?  no   Family History  Problem Relation Age of Onset  . Hypertension Mother   . Hyperlipidemia Mother   . Hypertension Father   . Hyperlipidemia Father   . Thyroid disease Father   . Hypertension Brother    Social History   Socioeconomic History  . Marital status: Single    Spouse name: Not on file  . Number of children: 2  . Years of education: Not on file  . Highest education level: Associate degree: academic program  Occupational History  . Occupation: Administrator, arts  Social Needs  . Financial resource strain: Not on file  . Food insecurity    Worry: Not on file    Inability: Not on file  . Transportation needs    Medical: Not on file    Non-medical: Not on file  Tobacco Use  . Smoking status: Never Smoker  . Smokeless tobacco: Never Used  Substance and Sexual Activity  . Alcohol use: Yes    Comment: Rarely  . Drug use: No  . Sexual activity: Yes    Birth control/protection: Surgical  Lifestyle  . Physical activity    Days per week: Not on file    Minutes per session: Not on file  .  Stress: Not on file  Relationships  . Social Herbalist on phone: Not on file    Gets together: Not on file    Attends religious service: Not on file    Active member of club or organization: Not on file    Attends meetings of clubs or organizations: Not on file    Relationship status: Not on file  Other Topics Concern  . Not on file  Social History Narrative   Lives at home with her daughter   Right handed   Drinks 3 cups of caffeine daily   Past Surgical History:  Procedure Laterality Date  . ABDOMINAL HYSTERECTOMY N/A 07/16/2013   Procedure: HYSTERECTOMY ABDOMINAL;  Surgeon: Allyn Kenner, DO;  Location: [facility];  Service: Gynecology;  Laterality: N/A;  . BILATERAL SALPINGECTOMY Bilateral 07/16/2013   Procedure: BILATERAL SALPINGECTOMY;  Surgeon: Allyn Kenner,  DO;  Location: [facility];  Service: Gynecology;  Laterality: Bilateral;  . CARPAL TUNNEL RELEASE  01/16/2012   Procedure: CARPAL TUNNEL RELEASE;  Surgeon: Nita Sells, MD;  Location: [facility];  Service: Orthopedics;  Laterality: Left;  . CARPAL TUNNEL RELEASE  02/20/2012   Procedure: CARPAL TUNNEL RELEASE;  Surgeon: Nita Sells, MD;  Location: [facility];  Service: Orthopedics;  Laterality: Right;  . COLONOSCOPY    . CYSTO WITH HYDRODISTENSION N/A 07/16/2013   Procedure: CYSTOSCOPY/HYDRODISTENSION;  Surgeon: Reece Packer, MD;  Location: [facility];  Service: Urology;  Laterality: N/A;  . DILATION AND CURETTAGE OF UTERUS    . FOOT SURGERY    . HYSTEROSCOPY W/D&C  10/31/2011   Procedure: DILATATION AND CURETTAGE /HYSTEROSCOPY;  Surgeon: Allyn Kenner, DO;  Location: [facility];  Service: Gynecology;  Laterality: N/A;  . LAPAROSCOPY N/A 07/16/2013   Procedure: LAPAROSCOPY DIAGNOSTIC;  Surgeon: Allyn Kenner, DO;  Location: [facility];  Service: Gynecology;  Laterality: N/A;  . PLANTAR FASCIA SURGERY    . PUBOVAGINAL SLING N/A 07/16/2013   Procedure: Gaynelle Arabian;  Surgeon: Reece Packer, MD;  Location: [facility];  Service: Urology;  Laterality: N/A;  . TUBAL LIGATION    . VAGINAL DELIVERY  1995,  2003   Past Medical History:  Diagnosis Date  . Chlamydia   . Diabetes ([place])   . GERD (gastroesophageal reflux disease)   . Hyperlipidemia   . Hypertension   . Migraine   . Obstructive sleep apnea on CPAP    pt has not worn CPAP in years  . Seasonal allergies   . Wears glasses    BP 136/88   Pulse 78   Temp 97.7 F (36.5 C)   Ht 5\' 6"  (1.676 m)   Wt 210 lb (95.3 kg) Comment: patient reported  LMP 06/27/2013   SpO2 98%   BMI 33.89 kg/m   Opioid Risk Score:   Fall Risk Score:  `1  Depression screen PHQ 2/9  Depression screen [facility] 2/9 05/23/2018 03/07/2018 11/20/2017 05/25/2017  Decreased Interest 0 0 2 3  Down, Depressed, Hopeless 0  0 2 1  PHQ - 2 Score 0 0 4 4  Altered sleeping - - 3 2  Tired, decreased energy - - 2 3  Change in appetite - - 2 1  Feeling bad or failure about yourself  - - 3 0  Trouble concentrating - - 3 3  Moving slowly or fidgety/restless - - 2 0  Suicidal thoughts - - 1 0  PHQ-9 Score - - 20 13  Difficult doing work/chores - -  Extremely dIfficult Not difficult at all    Review of Systems  Constitutional: Negative.   HENT: Negative.   Eyes: Negative.   Respiratory: Negative.   Cardiovascular: Negative.   Gastrointestinal: Negative.   Endocrine: Negative.   Genitourinary: Negative.   Musculoskeletal: Negative.   Skin: Negative.   Allergic/Immunologic: Negative.   Neurological: Negative.   Hematological: Negative.   Psychiatric/Behavioral: Negative.   All other systems reviewed and are negative.      Objective:   Physical Exam  General: Alert and oriented x 3, No apparent distress. Does not appear to be in pain HEENT: Head is normocephalic, atraumatic, PERRLA, EOMI, sclera anicteric, oral mucosa pink and moist, dentition intact, ext ear canals clear,  Neck: Supple without JVD or lymphadenopathy Heart: Reg rate and rhythm. No murmurs rubs or gallops Chest: CTA bilaterally without wheezes, rales, or rhonchi; no distress Abdomen: Soft, non-tender, non-distended, bowel sounds positive. Extremities: No clubbing, cyanosis, or edema. Pulses are 2+ Skin: Clean and intact without signs of breakdown Neuro:  Patient with fair attention.  She does state that I am a doctor but cannot remember my name.  She does not know why she is here.  However she can discuss her job and alcoholic about her boss and family members.  Has very poor insight and awareness still.  As noted above, frequently asked when she can go back to work.  When I spoke to her sister, the patient stated please do not forget that I am in the room.  Cranial nerves 2-12 are intact. Sensory exam is normal. Reflexes are 2+ in all 4's.  Fine motor coordination is intact. No tremors. Motor function is grossly 5/5.  Musculoskeletal:  May have some mild occipital and cervical discomfort.  Posture is fair. Psych: pleasant and cooperative, seems to be in good spirits.          Assessment & Plan:  1. Post concussionsyndrome with ongoing headaches, memory loss, tinnitus, vestibular symptoms, insomnia and emotional lability.  -Treatments have not responded to traditional methods including an inpatient rehab admission.  -From a cognitive standpoint I do not feel that were any farther along than we were at this point last year.  -Her ongoing cognitive deficits are not explained by the severity of her injury. 2.History of migraine headaches since her middle school years. Patient had been seen twice by neurology here in [place]. Apparently she has been on numerous medications in the past for prophylaxis and treatment.  Patient mention Botox for treatment which I am not overly anxious to pursue. 3. Uncontrolled hypertension 4. DM II    Plan:  1.   Short-term memory and attention at times are still issues.  However she is not using the methylphenidate consistently.  Therefore, I will not refill that today..  2. Continue with SLP to help develop ongoing HEP. 3. Maintain nortriptyline at 100 mg at bedtime for sleep and headache prophylaxis.Marland Kitchen 4.May continue Aricept for memory 10mg  5.Continue to address BP control. Encouraged patient to stay with regularly scheduled medications unless blood pressure indicated otherwise. 6. For headaches we will add diclofenac 50 mg twice daily as well as refill as needed Fioricet, #30 7.I do not feel that she is a good candidate for Botox at this time given the inconsistency of her complaints and symptoms. 8.  Given the inconsistencies of her appearance and neurological exam, I recommended that she might be evaluated by neuropsychiatry for other nonorganic sources of her  clinical appearance.  Follow-up with me in  about 3 mos. Fifteen minutes of face to face patient care time were spent during this visit. All questions were encouraged and answered.    Also spoke with Worker's Comp. case manager about the plan

## 2019-01-31 ENCOUNTER — Other Ambulatory Visit: Payer: Self-pay | Admitting: Physical Medicine & Rehabilitation

## 2019-01-31 DIAGNOSIS — R413 Other amnesia: Secondary | ICD-10-CM

## 2019-02-20 ENCOUNTER — Telehealth: Payer: Self-pay | Admitting: *Deleted

## 2019-02-20 NOTE — Telephone Encounter (Signed)
Melissa CM from [facility] is calling about IME report faxed to DR Naaman Plummer on 01/31/19.  She is asking for a cll back from DR Naaman Plummer to discuss his thoughts on the report.  Her # is [phone]

## 2019-02-20 NOTE — Telephone Encounter (Signed)
I last reviewed it over 2 weeks ago. It is no longer on hand. It is not scanned into Epic. I need a copy to reference when I speak with her thanks

## 2019-03-28 ENCOUNTER — Telehealth: Payer: Self-pay

## 2019-03-28 NOTE — Telephone Encounter (Signed)
I have reviewed material, but it has been a few weeks. If the second opinion/report is not already on my desk, could someone place a copy there?  thx

## 2019-03-28 NOTE — Telephone Encounter (Signed)
Bridget Anderson, Case Manager called requesting update. She is asking if second opinion has been reviewed and if you agree with patient seeing the other Physiatrist?
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
## 2019-04-24 ENCOUNTER — Other Ambulatory Visit: Payer: Self-pay

## 2019-04-24 ENCOUNTER — Encounter: Payer: Self-pay | Admitting: Physical Medicine & Rehabilitation

## 2019-04-24 ENCOUNTER — Encounter
Payer: No Typology Code available for payment source | Attending: Physical Medicine & Rehabilitation | Admitting: Physical Medicine & Rehabilitation

## 2019-04-24 DIAGNOSIS — G44329 Chronic post-traumatic headache, not intractable: Secondary | ICD-10-CM | POA: Diagnosis present

## 2019-04-24 DIAGNOSIS — R411 Anterograde amnesia: Secondary | ICD-10-CM

## 2019-04-24 DIAGNOSIS — G43711 Chronic migraine without aura, intractable, with status migrainosus: Secondary | ICD-10-CM | POA: Diagnosis present

## 2019-04-24 NOTE — Progress Notes (Signed)
Subjective:    Patient ID: Bridget Anderson, female    DOB: 13-Dec-1970, 49 y.o.   MRN: [id number]  HPI   Mrs. Veres is here in follow-up of her postconcussion syndrome and associated deficits.  Since I last saw her Michel's sister reports that she has been doing better with her blood pressure.  Her family doctor made some further medication adjustments which seemed to help.  Not so  surprisingly her headaches have been improved as well.  She does report an intermittent headache at the crown of her head.  She typically will rub the area and that seems to help with the pain levels.  The pain will subside often on its own.  She continues on diclofenac as well as nortriptyline and Fioricet for pain control.  She is also on the nortriptyline to help with sleep.  Sleep does remain an ongoing problem.  She did work a swing shift before and was frequently up at night.  The patient states that she has not so much a problem falling asleep as she does staying asleep.  She may wake up at 3-5 o'clock in the mornings some days.  When she is up she has difficulty falling back to sleep.  Sister reports that she is been a little more responsible at home with activities.  She is keeping the house clean.  She tries to keep up with her medications and check her blood pressure.  She also started a new injection for her diabetes which she is giving to herself.  Pain Inventory Average Pain 4 Pain Right Now 4 My pain is sharp and burning  In the last 24 hours, has pain interfered with the following? General activity 3 Relation with others 3 Enjoyment of life 3 What TIME of day is your pain at its worst? all Sleep (in general) Poor  Pain is worse with: na Pain improves with: medication Relief from Meds: 6  Mobility walk without assistance  Function Do you have any goals in this area?  no  Neuro/Psych dizziness  Prior Studies Any changes since last visit?  no  Physicians involved in your care Any changes  since last visit?  no   Family History  Problem Relation Age of Onset  . Hypertension Mother   . Hyperlipidemia Mother   . Hypertension Father   . Hyperlipidemia Father   . Thyroid disease Father   . Hypertension Brother    Social History   Socioeconomic History  . Marital status: Single    Spouse name: Not on file  . Number of children: 2  . Years of education: Not on file  . Highest education level: Associate degree: academic program  Occupational History  . Occupation: Administrator, arts  Tobacco Use  . Smoking status: Never Smoker  . Smokeless tobacco: Never Used  Substance and Sexual Activity  . Alcohol use: Yes    Comment: Rarely  . Drug use: No  . Sexual activity: Yes    Birth control/protection: Surgical  Other Topics Concern  . Not on file  Social History Narrative   Lives at home with her daughter   Right handed   Drinks 3 cups of caffeine daily   Social Determinants of Health   Financial Resource Strain:   . Difficulty of Paying Living Expenses: Not on file  Food Insecurity:   . Worried About Charity fundraiser in the Last Year: Not on file  . Ran Out of Food in the Last Year: Not on  file  Transportation Needs:   . Film/video editor (Medical): Not on file  . Lack of Transportation (Non-Medical): Not on file  Physical Activity:   . Days of Exercise per Week: Not on file  . Minutes of Exercise per Session: Not on file  Stress:   . Feeling of Stress : Not on file  Social Connections:   . Frequency of Communication with Friends and Family: Not on file  . Frequency of Social Gatherings with Friends and Family: Not on file  . Attends Religious Services: Not on file  . Active Member of Clubs or Organizations: Not on file  . Attends Archivist Meetings: Not on file  . Marital Status: Not on file   Past Surgical History:  Procedure Laterality Date  . ABDOMINAL HYSTERECTOMY N/A 07/16/2013   Procedure: HYSTERECTOMY ABDOMINAL;  Surgeon: Allyn Kenner, DO;  Location: [facility];  Service: Gynecology;  Laterality: N/A;  . BILATERAL SALPINGECTOMY Bilateral 07/16/2013   Procedure: BILATERAL SALPINGECTOMY;  Surgeon: Allyn Kenner, DO;  Location: [facility];  Service: Gynecology;  Laterality: Bilateral;  . CARPAL TUNNEL RELEASE  01/16/2012   Procedure: CARPAL TUNNEL RELEASE;  Surgeon: Nita Sells, MD;  Location: [facility];  Service: Orthopedics;  Laterality: Left;  . CARPAL TUNNEL RELEASE  02/20/2012   Procedure: CARPAL TUNNEL RELEASE;  Surgeon: Nita Sells, MD;  Location: [facility];  Service: Orthopedics;  Laterality: Right;  . COLONOSCOPY    . CYSTO WITH HYDRODISTENSION N/A 07/16/2013   Procedure: CYSTOSCOPY/HYDRODISTENSION;  Surgeon: Reece Packer, MD;  Location: [facility];  Service: Urology;  Laterality: N/A;  . DILATION AND CURETTAGE OF UTERUS    . FOOT SURGERY    . HYSTEROSCOPY WITH D & C  10/31/2011   Procedure: DILATATION AND CURETTAGE /HYSTEROSCOPY;  Surgeon: Allyn Kenner, DO;  Location: [facility];  Service: Gynecology;  Laterality: N/A;  . LAPAROSCOPY N/A 07/16/2013   Procedure: LAPAROSCOPY DIAGNOSTIC;  Surgeon: Allyn Kenner, DO;  Location: [facility];  Service: Gynecology;  Laterality: N/A;  . PLANTAR FASCIA SURGERY    . PUBOVAGINAL SLING N/A 07/16/2013   Procedure: Gaynelle Arabian;  Surgeon: Reece Packer, MD;  Location: [facility];  Service: Urology;  Laterality: N/A;  . TUBAL LIGATION    . VAGINAL DELIVERY  1995,  2003   Past Medical History:  Diagnosis Date  . Chlamydia   . Diabetes ([place])   . GERD (gastroesophageal reflux disease)   . Hyperlipidemia   . Hypertension   . Migraine   . Obstructive sleep apnea on CPAP    pt has not worn CPAP in years  . Seasonal allergies   . Wears glasses    LMP 06/27/2013   Opioid Risk Score:   Fall Risk Score:  `1  Depression screen PHQ 2/9  Depression screen [facility] 2/9 05/23/2018 03/07/2018 11/20/2017 05/25/2017  Decreased  Interest 0 0 2 3  Down, Depressed, Hopeless 0 0 2 1  PHQ - 2 Score 0 0 4 4  Altered sleeping - - 3 2  Tired, decreased energy - - 2 3  Change in appetite - - 2 1  Feeling bad or failure about yourself  - - 3 0  Trouble concentrating - - 3 3  Moving slowly or fidgety/restless - - 2 0  Suicidal thoughts - - 1 0  PHQ-9 Score - - 20 13  Difficult doing work/chores - - Extremely dIfficult Not difficult at all     Review of  Systems  Constitutional: Negative.   HENT: Negative.   Eyes: Negative.   Respiratory: Negative.   Cardiovascular: Negative.   Gastrointestinal: Negative.   Endocrine: Negative.   Genitourinary: Negative.   Musculoskeletal: Negative.   Skin: Negative.   Allergic/Immunologic: Negative.   Neurological: Positive for dizziness and headaches.  Hematological: Negative.   Psychiatric/Behavioral: Negative.   All other systems reviewed and are negative.      Objective:   Physical Exam  General: No acute distress HEENT: EOMI, oral membranes moist Cards: reg rate  Chest: normal effort Abdomen: Soft, NT, ND Skin: dry, intact Extremities: no edema Neuro:Patient with improved attention and awareness.  She has some recollection of me but again cannot remember my name.  She also did not remember the case manager who came to her visit today.  She did recall some of her blood pressure medications and that she was taking them and checking her blood pressure and blood sugars at home.  We talked about work and the reasons that she would not be safe to go back to work and she seemed to have some awareness as to why her memory might be a problem.  Strength is 5 out of 5.  Cranial nerve exam is unremarkable.  Sensory exam intact .  Musculoskeletal: May have some mild occipital and cervical discomfort.  Posture is fair. Psych:pleasant and cooperative, seems to be in good spirits.       Assessment & Plan:  1. Post concussionsyndrome with ongoing headaches, memory  loss, tinnitus, vestibular symptoms, insomnia and emotional lability.      -Treatments have not responded to traditional methods including an inpatient rehab admission.      -Day to day memory and awareness seem to be somewhat improved due to better pain and blood pressure control      -Regardless, her ongoing cognitive deficits are not explained by the severity of her injury based on history of the injury and radiographical imaging. Certainly a small injury to the hippocampus could be missed on an open MRI but would not be likely to occur given mechanism and details of her injury. Given the above, I suspect some sort of psychological overlay to her clinical picture.  2.History of migraine headaches since her middle school years. Patient had been seen twice by neurology here in [place]. Apparently she has been on numerous medications in the past for prophylaxis and treatment.  These are better controlled with improved BP control 3. Uncontrolled hypertension--improved control 4. DM II    Plan:  1.  Short-term memory and attention at times are still issues.  Some improvement as noted above with better pain and blood pressure management.  2. Discussed sleep hygiene at length today and discussed ways that she can help improve falling asleep as well as returning to sleep if she wakes up including decreased distractions in the room, quiet music or sounds which can help lower her back to sleep, magazines or books to help settle her mind down as well..... All of these options were discussed.  I also recommended that she has a regular sleep time and avoid napping during the day. 3. Maintainnortriptyline at100 mg at bedtime for sleep and headache prophylaxis.Marland Kitchen Headaches have been better for the most part.  She did she still is having some intermittent headache but they appear more mild, and I am not anxious to add more medication to her list. 4.Continue Aricept for memory 10mg  as it seems to be  giving her some benefit with day-to-day  memory..  5.  BP control per primary.   Seems to be doing a better job with her monitoring of her blood pressure as well as being more consistent with taking her medications.   Her primary can follow this more closely than I given proximity to the patient.  6. In addition to the above, for headaches continue diclofenac 50 mg twice daily as well as prn Fioricet, #30 7.I still do not feel that she is a good candidate for Botox at this time given the inconsistency of her complaints and symptoms. 8.  Given the inconsistencies of her neurological and clinical appearance, I recommend that she  be evaluated by neuropsychiatry for other non-organic contributing factors.       Follow-up with me in about72mos.25 min of face to face patient care time were spent during this visit. All questions were encouraged and answered.  Also spoke with Worker's Comp. case manager about the plan

## 2019-04-24 NOTE — Patient Instructions (Addendum)
SLEEP HYGIENE  1. GO TO BED AT A CONSISTENT TIME 2. TURN OFF LIGHTS 3. USE RELAXING MUSIC OR SOUNDS 4. READ A BOOK OR MAGAZINE 5. AVOID CAFFEINE OR EATING AT BEDTIME.
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
## 2019-06-10 ENCOUNTER — Ambulatory Visit: Payer: 59 | Admitting: Neurology

## 2019-06-10 ENCOUNTER — Encounter: Payer: Self-pay | Admitting: Neurology

## 2019-06-10 VITALS — BP 162/102 | HR 77 | Temp 97.8°F | Ht 66.0 in | Wt 212.0 lb

## 2019-06-10 DIAGNOSIS — G43711 Chronic migraine without aura, intractable, with status migrainosus: Secondary | ICD-10-CM

## 2019-06-10 MED ORDER — NURTEC 75 MG PO TBDP: 75.0000 mg | ORAL_TABLET | Freq: Every day | ORAL | 6 refills | Status: DC | PRN

## 2019-06-10 MED ORDER — AJOVY 225 MG/1.5ML ~~LOC~~ SOAJ: 225.0000 mg | SUBCUTANEOUS | 11 refills | Status: AC

## 2019-06-10 NOTE — Patient Instructions (Signed)
Ajovy monthly Nurtec as needed for migraine or headache  Rimegepant oral dissolving tablet What is this medicine? RIMEGEPANT (ri ME je pant) is used to treat migraine headaches with or without aura. An aura is a strange feeling or visual disturbance that warns you of an attack. It is not used to prevent migraines. This medicine may be used for other purposes; ask your health care provider or pharmacist if you have questions. COMMON BRAND NAME(S): NURTEC ODT What should I tell my health care provider before I take this medicine? They need to know if you have any of these conditions:  kidney disease  liver disease  an unusual or allergic reaction to rimegepant, other medicines, foods, dyes, or preservatives  pregnant or trying to get pregnant  breast-feeding How should I use this medicine? Take the medicine by mouth. Follow the directions on the prescription label. Leave the tablet in the sealed blister pack until you are ready to take it. With dry hands, open the blister and gently remove the tablet. If the tablet breaks or crumbles, throw it away and take a new tablet out of the blister pack. Place the tablet in the mouth and allow it to dissolve, and then swallow. Do not cut, crush, or chew this medicine. You do not need water to take this medicine. Talk to your pediatrician about the use of this medicine in children. Special care may be needed. Overdosage: If you think you have taken too much of this medicine contact a poison control center or emergency room at once. NOTE: This medicine is only for you. Do not share this medicine with others. What if I miss a dose? This does not apply. This medicine is not for regular use. What may interact with this medicine? This medicine may interact with the following medications:  certain medicines for fungal infections like fluconazole, itraconazole  rifampin This list may not describe all possible interactions. Give your health care provider  a list of all the medicines, herbs, non-prescription drugs, or dietary supplements you use. Also tell them if you smoke, drink alcohol, or use illegal drugs. Some items may interact with your medicine. What should I watch for while using this medicine? Visit your health care professional for regular checks on your progress. Tell your health care professional if your symptoms do not start to get better or if they get worse. What side effects may I notice from receiving this medicine? Side effects that you should report to your doctor or health care professional as soon as possible:  allergic reactions like skin rash, itching or hives; swelling of the face, lips, or tongue Side effects that usually do not require medical attention (report these to your doctor or health care professional if they continue or are bothersome):  nausea This list may not describe all possible side effects. Call your doctor for medical advice about side effects. You may report side effects to FDA at 1-800-FDA-1088. Where should I keep my medicine? Keep out of the reach of children. Store at room temperature between 15 and 30 degrees C (59 and 86 degrees F). Throw away any unused medicine after the expiration date. NOTE: This sheet is a summary. It may not cover all possible information. If you have questions about this medicine, talk to your doctor, pharmacist, or health care provider.  2020 Elsevier/Gold Standard (2018-04-23 00:21:31) Rolanda Lundborg injection What is this medicine? FREMANEZUMAB (fre ma NEZ ue mab) is used to prevent migraine headaches. This medicine may be used for other  purposes; ask your health care provider or pharmacist if you have questions. COMMON BRAND NAME(S): AJOVY What should I tell my health care provider before I take this medicine? They need to know if you have any of these conditions:  an unusual or allergic reaction to fremanezumab, other medicines, foods, dyes, or  preservatives  pregnant or trying to get pregnant  breast-feeding How should I use this medicine? This medicine is for injection under the skin. You will be taught how to prepare and give this medicine. Use exactly as directed. Take your medicine at regular intervals. Do not take your medicine more often than directed. It is important that you put your used needles and syringes in a special sharps container. Do not put them in a trash can. If you do not have a sharps container, call your pharmacist or healthcare provider to get one. Talk to your pediatrician regarding the use of this medicine in children. Special care may be needed. Overdosage: If you think you have taken too much of this medicine contact a poison control center or emergency room at once. NOTE: This medicine is only for you. Do not share this medicine with others. What if I miss a dose? If you miss a dose, take it as soon as you can. If it is almost time for your next dose, take only that dose. Do not take double or extra doses. What may interact with this medicine? Interactions are not expected. This list may not describe all possible interactions. Give your health care provider a list of all the medicines, herbs, non-prescription drugs, or dietary supplements you use. Also tell them if you smoke, drink alcohol, or use illegal drugs. Some items may interact with your medicine. What should I watch for while using this medicine? Tell your doctor or healthcare professional if your symptoms do not start to get better or if they get worse. What side effects may I notice from receiving this medicine? Side effects that you should report to your doctor or health care professional as soon as possible:  allergic reactions like skin rash, itching or hives, swelling of the face, lips, or tongue Side effects that usually do not require medical attention (report these to your doctor or health care professional if they continue or are  bothersome):  pain, redness, or irritation at site where injected This list may not describe all possible side effects. Call your doctor for medical advice about side effects. You may report side effects to FDA at 1-800-FDA-1088. Where should I keep my medicine? Keep out of the reach of children. You will be instructed on how to store this medicine. Throw away any unused medicine after the expiration date on the label. NOTE: This sheet is a summary. It may not cover all possible information. If you have questions about this medicine, talk to your doctor, pharmacist, or health care provider.  2020 Elsevier/Gold Standard (2016-11-07 17:22:56)

## 2019-06-12 ENCOUNTER — Telehealth: Payer: Self-pay | Admitting: *Deleted

## 2019-06-12 NOTE — Telephone Encounter (Signed)
Ajovy PA completed on Cover My Meds. Key: [id number]. Awaiting determination from [facility].

## 2019-06-20 NOTE — Telephone Encounter (Signed)
Received fax from [facility] stating Bridget Anderson is not a covered benefit and is therefore excluded from coverage. We can appeal if desired. Fax to [phone]. However pt should be able to use the savings card to get Ajovy for $5 per month regardless of insurance denial. Reference # [id number].

## 2019-08-07 ENCOUNTER — Other Ambulatory Visit: Payer: Self-pay | Admitting: Gastroenterology

## 2019-08-07 ENCOUNTER — Other Ambulatory Visit (HOSPITAL_COMMUNITY): Payer: Self-pay | Admitting: Gastroenterology

## 2019-08-07 DIAGNOSIS — R1011 Right upper quadrant pain: Secondary | ICD-10-CM

## 2019-08-20 ENCOUNTER — Ambulatory Visit (HOSPITAL_COMMUNITY)
Admission: RE | Admit: 2019-08-20 | Discharge: 2019-08-20 | Disposition: A | Discharge status: Home / Self Care | Payer: 59 | Source: Ambulatory Visit | Attending: Gastroenterology | Admitting: Gastroenterology

## 2019-08-20 ENCOUNTER — Encounter (HOSPITAL_COMMUNITY)
Admission: RE | Admit: 2019-08-20 | Discharge: 2019-08-20 | Disposition: A | Discharge status: Home / Self Care | Payer: 59 | Source: Ambulatory Visit | Attending: Gastroenterology | Admitting: Gastroenterology

## 2019-08-20 DIAGNOSIS — R1011 Right upper quadrant pain: Secondary | ICD-10-CM | POA: Diagnosis present

## 2019-09-04 ENCOUNTER — Encounter: Payer: No Typology Code available for payment source | Admitting: Physical Medicine & Rehabilitation

## 2019-09-30 ENCOUNTER — Other Ambulatory Visit: Payer: Self-pay | Admitting: Surgery

## 2019-10-09 ENCOUNTER — Ambulatory Visit: Payer: 59 | Admitting: Neurology

## 2019-10-23 ENCOUNTER — Other Ambulatory Visit: Payer: Self-pay

## 2019-10-23 ENCOUNTER — Encounter: Payer: Self-pay | Admitting: Physical Medicine & Rehabilitation

## 2019-10-23 ENCOUNTER — Encounter
Payer: No Typology Code available for payment source | Attending: Physical Medicine & Rehabilitation | Admitting: Physical Medicine & Rehabilitation

## 2019-10-23 VITALS — BP 151/93 | HR 83 | Temp 98.1°F | Ht 66.0 in | Wt 202.0 lb

## 2019-10-23 DIAGNOSIS — G43711 Chronic migraine without aura, intractable, with status migrainosus: Secondary | ICD-10-CM | POA: Diagnosis present

## 2019-10-23 DIAGNOSIS — R411 Anterograde amnesia: Secondary | ICD-10-CM | POA: Insufficient documentation

## 2019-10-23 DIAGNOSIS — F0781 Postconcussional syndrome: Secondary | ICD-10-CM | POA: Diagnosis present

## 2019-10-23 MED ORDER — TOPIRAMATE 50 MG PO TABS: 50.0000 mg | ORAL_TABLET | Freq: Two times a day (BID) | ORAL | 4 refills | Status: DC

## 2019-10-23 NOTE — Progress Notes (Signed)
Subjective:    Patient ID: Bridget Anderson, female    DOB: 08/31/1970, 49 y.o.   MRN: [id number]  HPI   Bridget Anderson is here in follow up of her post concussive syndrome. She tells me she now has a boyfriend who is an old friend of hers. He's been seeing quite a bit of her and even went to [place] with her this summer. Her sister and mother still provide support. She tries to use her phone to remember her meds. She also watches movies and plays games on her phone. Sleep is not perfect but more consistent.   Her headaches are still present but better than they were. She has a bad headache typically once per week. The headaches are typically in the back of her head and along the right parietal.  Neurology rx'ed nurtec, and ajovy but WC wouldn't pay for these.   Her bp has been better.  Sister tells me it has been completely under control at home. It was a little elevated today at office.   hgb A1c was 6.4. sugars have been much better controlled per her sister.     Pain Inventory Average Pain 5 Pain Right Now 4 My pain is constant, burning and stabbing  LOCATION OF PAIN headache  BOWEL Number of stools per week: unknown Oral laxative use Yes  Type of laxative Miralax  Enema or suppository use No  History of colostomy No  Incontinent No   BLADDER Normal In and out cath, frequency  Able to self cath N/A Bladder incontinence No  Frequent urination No  Leakage with coughing No  Difficulty starting stream No  Incomplete bladder emptying No    Mobility walk without assistance ability to climb steps?  yes do you drive?  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  no  Physicians involved in your care Any changes since last visit?  no   Family History  Problem Relation Age of Onset   Hypertension Mother    Hyperlipidemia Mother    Hypertension Father    Hyperlipidemia Father     Thyroid disease Father    Hypertension Brother    Migraines Maternal Aunt    Social History   Socioeconomic History   Marital status: Single    Spouse name: Not on file   Number of children: 2   Years of education: Not on file   Highest education level: Associate degree: academic program  Occupational History   Occupation: Administrator, arts  Tobacco Use   Smoking status: Never Smoker   Smokeless tobacco: Never Used  Scientific laboratory technician Use: Never used  Substance and Sexual Activity   Alcohol use: Yes    Comment: Rarely   Drug use: No   Sexual activity: Yes    Birth control/protection: Surgical  Other Topics Concern   Not on file  Social History Narrative   Lives at home with her youngest daughter    Right handed   Drinks < 3 cups of caffeine daily   Social Determinants of Health   Financial Resource Strain:    Difficulty of Paying Living Expenses: Not on file  Food Insecurity:    Worried About Charity fundraiser in the Last Year: Not on file   [employer] of Food in the Last Year: Not on file  Transportation Needs:    Lack of Transportation (Medical): Not on file  Lack of Transportation (Non-Medical): Not on file  Physical Activity:    Days of Exercise per Week: Not on file   Minutes of Exercise per Session: Not on file  Stress:    Feeling of Stress : Not on file  Social Connections:    Frequency of Communication with Friends and Family: Not on file   Frequency of Social Gatherings with Friends and Family: Not on file   Attends Religious Services: Not on file   Active Member of Clubs or Organizations: Not on file   Attends Archivist Meetings: Not on file   Marital Status: Not on file   Past Surgical History:  Procedure Laterality Date   ABDOMINAL HYSTERECTOMY N/A 07/16/2013   Procedure: HYSTERECTOMY ABDOMINAL;  Surgeon: Allyn Kenner, DO;  Location: [facility];  Service: Gynecology;  Laterality: N/A;   BILATERAL SALPINGECTOMY  Bilateral 07/16/2013   Procedure: BILATERAL SALPINGECTOMY;  Surgeon: Allyn Kenner, DO;  Location: [facility];  Service: Gynecology;  Laterality: Bilateral;   CARPAL TUNNEL RELEASE  01/16/2012   Procedure: CARPAL TUNNEL RELEASE;  Surgeon: Nita Sells, MD;  Location: [facility];  Service: Orthopedics;  Laterality: Left;   CARPAL TUNNEL RELEASE  02/20/2012   Procedure: CARPAL TUNNEL RELEASE;  Surgeon: Nita Sells, MD;  Location: [facility];  Service: Orthopedics;  Laterality: Right;   COLONOSCOPY     CYSTO WITH HYDRODISTENSION N/A 07/16/2013   Procedure: CYSTOSCOPY/HYDRODISTENSION;  Surgeon: Reece Packer, MD;  Location: [facility];  Service: Urology;  Laterality: N/A;   DILATION AND CURETTAGE OF UTERUS     FOOT SURGERY     HYSTEROSCOPY WITH D & C  10/31/2011   Procedure: DILATATION AND CURETTAGE /HYSTEROSCOPY;  Surgeon: Allyn Kenner, DO;  Location: [facility];  Service: Gynecology;  Laterality: N/A;   LAPAROSCOPY N/A 07/16/2013   Procedure: LAPAROSCOPY DIAGNOSTIC;  Surgeon: Allyn Kenner, DO;  Location: [facility];  Service: Gynecology;  Laterality: N/A;   PLANTAR FASCIA SURGERY     PUBOVAGINAL SLING N/A 07/16/2013   Procedure: Gaynelle Arabian;  Surgeon: Reece Packer, MD;  Location: [facility];  Service: Urology;  Laterality: N/A;   TUBAL LIGATION     VAGINAL DELIVERY  1995,  2003   Past Medical History:  Diagnosis Date   Chlamydia    Diabetes ([place])    GERD (gastroesophageal reflux disease)    Hyperlipidemia    Hypertension    Migraine    Obstructive sleep apnea on CPAP    pt has not worn CPAP in years   Seasonal allergies    Wears glasses    BP (!) 151/93    Pulse 83    Temp 98.1 F (36.7 C)    Ht 5\' 6"  (1.676 m)    Wt 202 lb (91.6 kg)    LMP 06/27/2013    SpO2 98%    BMI 32.60 kg/m   Opioid Risk Score:   Fall Risk Score:  `1  Depression screen PHQ 2/9  Depression screen [facility] 2/9 05/23/2018 03/07/2018  11/20/2017 05/25/2017  Decreased Interest 0 0 2 3  Down, Depressed, Hopeless 0 0 2 1  PHQ - 2 Score 0 0 4 4  Altered sleeping - - 3 2  Tired, decreased energy - - 2 3  Change in appetite - - 2 1  Feeling bad or failure about yourself  - - 3 0  Trouble concentrating - - 3 3  Moving slowly or fidgety/restless - - 2 0  Suicidal  thoughts - - 1 0  PHQ-9 Score - - 20 13  Difficult doing work/chores - - Extremely dIfficult Not difficult at all   Review of Systems  Constitutional: Negative.   HENT: Negative.   Eyes: Negative.   Respiratory: Negative.   Cardiovascular: Negative.   Gastrointestinal: Negative.   Endocrine: Negative.   Genitourinary: Negative.   Musculoskeletal: Negative.   Skin: Negative.   Allergic/Immunologic: Negative.   Neurological: Positive for headaches.  Hematological: Negative.   Psychiatric/Behavioral: Negative.        Objective:   Physical Exam  General: No acute distress HEENT: EOMI, oral membranes moist Cards: reg rate  Chest: normal effort Abdomen: Soft, NT, ND Skin: dry, intact Extremities: no edema  Neuro: improved attention,, still reduced awareness of her deficits. Decreased STM.  Could not recall my name or recent events. needed cues for month but could remember events such as a trip when she was cued.  Strength is 5 out of 5.  Cranial nerve exam is unremarkable.  Sensory exam intact good balance .  Musculoskeletal:  mild occipital discomfort.  Psych: pleasant              Assessment & Plan:  1. Post concussion syndrome with ongoing headaches, memory loss, tinnitus, vestibular symptoms, insomnia and emotional lability. I see generalized improvements since our last visit.  2.  History of migraine headaches since her middle school years.  Patient had been seen twice by neurology here in [place].  Apparently she has been on numerous medications in the past for prophylaxis and treatment.  These are [facility] better controlled with improved BP  control 3. Uncontrolled hypertension--much improved control 4. DM II---hgb A1C 6.1 ===much better!    .     Plan:   1.   Short-term memory and attention at times are still issues.       -Day to day memory and awareness seem better but memory is still her major barrier.      -we discussed at length the need for a daily routine and schedule. She needs to regularly use a calendar, organizer, etc and make the use of this automatic. 2.  improved sleep hygiene. Still needs work on consistency 3.  Maintain nortriptyline at 100 mg at bedtime for sleep and headache prophylaxis..   -begin trial of topamax 50mg  at bedtime for migraine prophylaxis then BID  -she can't afford nurtec/ajovy 4. Continue Aricept for memory 10mg  as it seems to be giving her some benefit with day-to-day memory..  5.    BP control per primary.     6.    prn Fioricet, #30 7.  I still do not feel that she is a good candidate for Botox   8.  Given the inconsistencies of her neurological and clinical appearance, I recommend that she  be evaluated by neuropsychiatry for other non-organic contributing factors to her presentation. I don't see the utility in another inpatient rehab admission to assess her presentation.             Follow-up with me in about  75mos. 15 min of face to face patient care time were spent during this visit. I reviewed case with CM also. All questions were encouraged and answered.

## 2019-10-23 NOTE — Patient Instructions (Addendum)
PLEASE FEEL FREE TO CALL OUR OFFICE WITH ANY PROBLEMS OR QUESTIONS ([phone])  TOPAMAX 50MG  AT BEDTIME FOR A WEEK THEN TWICE DAILY

## 2019-11-04 ENCOUNTER — Other Ambulatory Visit: Payer: Self-pay | Admitting: Physical Medicine & Rehabilitation

## 2019-11-04 DIAGNOSIS — I10 Essential (primary) hypertension: Secondary | ICD-10-CM

## 2019-11-15 ENCOUNTER — Other Ambulatory Visit: Payer: Self-pay

## 2019-11-15 ENCOUNTER — Encounter (HOSPITAL_BASED_OUTPATIENT_CLINIC_OR_DEPARTMENT_OTHER): Payer: Self-pay | Admitting: Surgery

## 2019-11-18 ENCOUNTER — Inpatient Hospital Stay (HOSPITAL_COMMUNITY): Admission: RE | Admit: 2019-11-18 | Payer: 59 | Source: Ambulatory Visit

## 2019-11-19 ENCOUNTER — Encounter (HOSPITAL_BASED_OUTPATIENT_CLINIC_OR_DEPARTMENT_OTHER)
Admission: RE | Admit: 2019-11-19 | Discharge: 2019-11-19 | Disposition: A | Discharge status: Home / Self Care | Payer: 59 | Source: Ambulatory Visit | Attending: Surgery | Admitting: Surgery

## 2019-11-19 ENCOUNTER — Other Ambulatory Visit (HOSPITAL_COMMUNITY)
Admission: RE | Admit: 2019-11-19 | Discharge: 2019-11-19 | Disposition: A | Discharge status: Home / Self Care | Payer: 59 | Source: Ambulatory Visit | Attending: Surgery | Admitting: Surgery

## 2019-11-19 DIAGNOSIS — Z01812 Encounter for preprocedural laboratory examination: Secondary | ICD-10-CM | POA: Insufficient documentation

## 2019-11-19 DIAGNOSIS — J45909 Unspecified asthma, uncomplicated: Secondary | ICD-10-CM | POA: Diagnosis not present

## 2019-11-19 DIAGNOSIS — G4730 Sleep apnea, unspecified: Secondary | ICD-10-CM | POA: Diagnosis not present

## 2019-11-19 DIAGNOSIS — Z20822 Contact with and (suspected) exposure to covid-19: Secondary | ICD-10-CM | POA: Insufficient documentation

## 2019-11-19 DIAGNOSIS — K8010 Calculus of gallbladder with chronic cholecystitis without obstruction: Secondary | ICD-10-CM | POA: Diagnosis not present

## 2019-11-19 DIAGNOSIS — K828 Other specified diseases of gallbladder: Secondary | ICD-10-CM | POA: Diagnosis not present

## 2019-11-19 LAB — BASIC METABOLIC PANEL
Anion gap: 8 (ref 5–15)
BUN: 10 mg/dL (ref 6–20)
CO2: 24 mmol/L (ref 22–32)
Calcium: 8.9 mg/dL (ref 8.9–10.3)
Chloride: 104 mmol/L (ref 98–111)
Creatinine, Ser: 0.71 mg/dL (ref 0.44–1.00)
GFR calc Af Amer: >60 mL/min (ref >60)
GFR calc non Af Amer: >60 mL/min (ref >60)
Glucose, Bld: 75 mg/dL (ref 70–99)
Potassium: 4.7 mmol/L (ref 3.5–5.1)
Sodium: 136 mmol/L (ref 135–145)

## 2019-11-19 LAB — SARS CORONAVIRUS 2 (TAT 6-24 HRS): SARS Coronavirus 2: NEGATIVE

## 2019-11-19 MED ORDER — ENSURE PRE-SURGERY PO LIQD: 296.0000 mL | Freq: Once | ORAL | Status: DC

## 2019-11-19 NOTE — Progress Notes (Signed)

## 2019-11-20 NOTE — H&P (Signed)
Bridget Anderson  Location: [facility] Patient #: [id number] DOB: [birth year] Single / Language: Cleophus Molt / Race: Black or African American Female   History of Present Illness (Voncile Schwarz A. Ninfa Linden MD;   The patient is a 49 year old female who presents for evaluation of gall stones.  Chief complaint: Right upper quadrant abdominal pain and symptomatic gallstones  This is a pleasant 49 year old female referred by Dr. Collene Mares and myself symptomatic gallstones. She has a complex history of migraine headaches and postconcussive syndrome and has had nausea for years. She is also now developed significant right upper quadrant and epigastric abdominal pain with bloating and occasional nausea and vomiting after fatty meals. She has had an unremarkable upper and lower endoscopy. She had an ultrasound showing multiple gallstones with the largest being 1.6 cm and a thickened gallbladder wall. The bile duct was normal. She is otherwise without complaints. Her pain is moderate in intensity and occurs about once a week. It occurs after fatty meals. She is otherwise without complaints.   Past Surgical History Malachi Bonds, CMA Foot Surgery  Right. Hysterectomy (not due to cancer) - Partial   Allergies Malachi Bonds, CMA;  No Known Drug Allergies   Medication History Malachi Bonds, CMA; Benazepril HCl (40MG  Tablet, Oral) Active. Donepezil HCl (10MG  Tablet, Oral) Active. Ajovy (225MG /1.5ML Soln Auto-inj, Subcutaneous) Active. glipiZIDE ER (10MG  Tablet ER 24HR, Oral) Active. hydroCHLOROthiazide (25MG  Tablet, Oral) Active. Metoprolol Succinate ER (100MG  Tablet ER 24HR, Oral) Active. Nortriptyline HCl (50MG  Capsule, Oral) Active. Lovastatin (10MG  Tablet, Oral) Active. Jardiance (25MG  Tablet, Oral) Active. Medications Reconciled  Social History Malachi Bonds, CMA Caffeine use  Carbonated beverages, Tea.  Family History (Chemira Jones, CMA; Diabetes Mellitus   Brother, Father, Mother. Hypertension  Brother, Father, Mother. Migraine Headache  Daughter.  Pregnancy / Birth History Malachi Bonds, CMA; Contraceptive History  Oral contraceptives.  Other Problems Malachi Bonds, CMA; Cholelithiasis  Gastroesophageal Reflux Disease  Hemorrhoids  High blood pressure  Migraine Headache     Review of Systems General Not Present- Appetite Loss, Chills, Fatigue, Fever, Night Sweats, Weight Gain and Weight Loss. Skin Not Present- Change in Wart/Mole, Dryness, Hives, Jaundice, New Lesions, Non-Healing Wounds, Rash and Ulcer. HEENT Present- Seasonal Allergies and Wears glasses/contact lenses. Not Present- Earache, Hearing Loss, Hoarseness, Nose Bleed, Oral Ulcers, Ringing in the Ears, Sinus Pain, Sore Throat, Visual Disturbances and Yellow Eyes. Respiratory Present- Snoring. Not Present- Bloody sputum, Chronic Cough, Difficulty Breathing and Wheezing. Cardiovascular Present- Shortness of Breath and Swelling of Extremities. Not Present- Chest Pain, Difficulty Breathing Lying Down, Leg Cramps, Palpitations and Rapid Heart Rate. Gastrointestinal Present- Bloating, Bloody Stool, Hemorrhoids, Indigestion, Nausea and Vomiting. Not Present- Abdominal Pain, Change in Bowel Habits, Chronic diarrhea, Constipation, Difficulty Swallowing, Excessive gas, Gets full quickly at meals and Rectal Pain. Female Genitourinary Not Present- Frequency, Nocturia, Painful Urination, Pelvic Pain and Urgency. Musculoskeletal Present- Joint Pain and Swelling of Extremities. Not Present- Back Pain, Joint Stiffness, Muscle Pain and Muscle Weakness. Neurological Present- Decreased Memory. Not Present- Fainting, Headaches, Numbness, Seizures, Tingling, Tremor, Trouble walking and Weakness. Psychiatric Not Present- Anxiety, Bipolar, Change in Sleep Pattern, Depression, Fearful and Frequent crying. Endocrine Not Present- Cold Intolerance, Excessive Hunger, Hair Changes, Heat  Intolerance, Hot flashes and New Diabetes.  Vitals   Weight: 204.4 lb Height: 65in Body Surface Area: 2 m Body Mass Index: 34.01 kg/m  Temp.: 40F  Pulse: 114 (Regular)  BP: 134/82(Sitting, Left Arm, Standard)       Physical Exam  The physical exam  findings are as follows: Note: Today she appears well and healthy  Her abdomen is soft There is no hepatomegaly There is no right upper quadrant tenderness or guarding today    Assessment & Plan   SYMPTOMATIC CHOLELITHIASIS (K80.20)  Impression: I reviewed all her medical history in the electronic medical records. I reviewed her ultrasound and laboratory data. 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 We discussed gallbladder disease in detail. I recommend proceeding with a laparoscopic cholecystectomy and she is very eager for surgery. I discussed the procedure in detail. The patient was given Neurosurgeon. We discussed the risks and benefits of a laparoscopic cholecystectomy and possible cholangiogram including, but not limited to, bleeding, infection, injury to surrounding structures such as the intestine or liver, bile leak, retained gallstones, need to convert to an open procedure, prolonged diarrhea, blood clots such as DVT, common bile duct injury, anesthesia risks, and possible need for additional procedures. The likelihood of improvement in symptoms and return to the patient's normal status is good. We discussed the typical post-operative recovery course. All questions were answered.

## 2019-11-21 ENCOUNTER — Encounter (HOSPITAL_BASED_OUTPATIENT_CLINIC_OR_DEPARTMENT_OTHER)
Admission: RE | Disposition: A | Discharge status: Home / Self Care | Payer: Self-pay | Source: Home / Self Care | Attending: Surgery

## 2019-11-21 ENCOUNTER — Encounter (HOSPITAL_BASED_OUTPATIENT_CLINIC_OR_DEPARTMENT_OTHER): Payer: Self-pay | Admitting: Surgery

## 2019-11-21 ENCOUNTER — Ambulatory Visit (HOSPITAL_BASED_OUTPATIENT_CLINIC_OR_DEPARTMENT_OTHER): Payer: 59 | Admitting: Anesthesiology

## 2019-11-21 ENCOUNTER — Ambulatory Visit (HOSPITAL_BASED_OUTPATIENT_CLINIC_OR_DEPARTMENT_OTHER)
Admission: RE | Admit: 2019-11-21 | Discharge: 2019-11-21 | Disposition: A | Discharge status: Home / Self Care | Payer: 59 | Attending: Surgery | Admitting: Surgery

## 2019-11-21 ENCOUNTER — Other Ambulatory Visit: Payer: Self-pay

## 2019-11-21 DIAGNOSIS — I10 Essential (primary) hypertension: Secondary | ICD-10-CM | POA: Insufficient documentation

## 2019-11-21 DIAGNOSIS — Z820 Family history of epilepsy and other diseases of the nervous system: Secondary | ICD-10-CM | POA: Diagnosis not present

## 2019-11-21 DIAGNOSIS — Z79899 Other long term (current) drug therapy: Secondary | ICD-10-CM | POA: Insufficient documentation

## 2019-11-21 DIAGNOSIS — Z90710 Acquired absence of both cervix and uterus: Secondary | ICD-10-CM | POA: Insufficient documentation

## 2019-11-21 DIAGNOSIS — J45909 Unspecified asthma, uncomplicated: Secondary | ICD-10-CM | POA: Diagnosis not present

## 2019-11-21 DIAGNOSIS — E119 Type 2 diabetes mellitus without complications: Secondary | ICD-10-CM | POA: Diagnosis not present

## 2019-11-21 DIAGNOSIS — G4730 Sleep apnea, unspecified: Secondary | ICD-10-CM | POA: Insufficient documentation

## 2019-11-21 DIAGNOSIS — Z7984 Long term (current) use of oral hypoglycemic drugs: Secondary | ICD-10-CM | POA: Insufficient documentation

## 2019-11-21 DIAGNOSIS — Z8249 Family history of ischemic heart disease and other diseases of the circulatory system: Secondary | ICD-10-CM | POA: Diagnosis not present

## 2019-11-21 DIAGNOSIS — K219 Gastro-esophageal reflux disease without esophagitis: Secondary | ICD-10-CM | POA: Insufficient documentation

## 2019-11-21 DIAGNOSIS — Z833 Family history of diabetes mellitus: Secondary | ICD-10-CM | POA: Insufficient documentation

## 2019-11-21 DIAGNOSIS — K828 Other specified diseases of gallbladder: Secondary | ICD-10-CM | POA: Insufficient documentation

## 2019-11-21 DIAGNOSIS — G43909 Migraine, unspecified, not intractable, without status migrainosus: Secondary | ICD-10-CM | POA: Insufficient documentation

## 2019-11-21 DIAGNOSIS — K8010 Calculus of gallbladder with chronic cholecystitis without obstruction: Secondary | ICD-10-CM | POA: Insufficient documentation

## 2019-11-21 DIAGNOSIS — Z6832 Body mass index (BMI) 32.0-32.9, adult: Secondary | ICD-10-CM | POA: Diagnosis not present

## 2019-11-21 HISTORY — PX: CHOLECYSTECTOMY: SHX55

## 2019-11-21 LAB — GLUCOSE, CAPILLARY
Glucose-Capillary: 110 mg/dL — ABNORMAL HIGH (ref 70–99)
Glucose-Capillary: 148 mg/dL — ABNORMAL HIGH (ref 70–99)

## 2019-11-21 SURGERY — LAPAROSCOPIC CHOLECYSTECTOMY
Anesthesia: General | Site: Abdomen

## 2019-11-21 MED ORDER — SUGAMMADEX SODIUM 200 MG/2ML IV SOLN
INTRAVENOUS | Status: DC | PRN
  Administered 2019-11-21: 200 mg via INTRAVENOUS

## 2019-11-21 MED ORDER — BUPIVACAINE HCL (PF) 0.5 % IJ SOLN
INTRAMUSCULAR | Status: AC
  Filled 2019-11-21: qty 150

## 2019-11-21 MED ORDER — FENTANYL CITRATE (PF) 100 MCG/2ML IJ SOLN
INTRAMUSCULAR | Status: AC
  Filled 2019-11-21: qty 2

## 2019-11-21 MED ORDER — BUPIVACAINE HCL (PF) 0.5 % IJ SOLN
INTRAMUSCULAR | Status: DC | PRN
  Administered 2019-11-21: 20 mL

## 2019-11-21 MED ORDER — LIDOCAINE HCL (CARDIAC) PF 100 MG/5ML IV SOSY
PREFILLED_SYRINGE | INTRAVENOUS | Status: DC | PRN
  Administered 2019-11-21: 100 mg via INTRAVENOUS

## 2019-11-21 MED ORDER — ACETAMINOPHEN 160 MG/5ML PO SOLN: 325.0000 mg | ORAL | Status: DC | PRN

## 2019-11-21 MED ORDER — LACTATED RINGERS IV SOLN: INTRAVENOUS | Status: DC | PRN

## 2019-11-21 MED ORDER — OXYCODONE HCL 5 MG PO TABS: 5.0000 mg | ORAL_TABLET | Freq: Four times a day (QID) | ORAL | 0 refills | Status: DC | PRN

## 2019-11-21 MED ORDER — CHLORHEXIDINE GLUCONATE CLOTH 2 % EX PADS: 6.0000 | MEDICATED_PAD | Freq: Once | CUTANEOUS | Status: DC

## 2019-11-21 MED ORDER — ONDANSETRON HCL 4 MG/2ML IJ SOLN
INTRAMUSCULAR | Status: AC
  Filled 2019-11-21: qty 2

## 2019-11-21 MED ORDER — MEPERIDINE HCL 25 MG/ML IJ SOLN: 6.2500 mg | INTRAMUSCULAR | Status: DC | PRN

## 2019-11-21 MED ORDER — GABAPENTIN 300 MG PO CAPS
300.0000 mg | ORAL_CAPSULE | ORAL | Status: AC
  Administered 2019-11-21: 300 mg via ORAL

## 2019-11-21 MED ORDER — MIDAZOLAM HCL 5 MG/5ML IJ SOLN
INTRAMUSCULAR | Status: DC | PRN
  Administered 2019-11-21: 2 mg via INTRAVENOUS

## 2019-11-21 MED ORDER — PROPOFOL 10 MG/ML IV BOLUS
INTRAVENOUS | Status: DC | PRN
  Administered 2019-11-21: 200 mg via INTRAVENOUS

## 2019-11-21 MED ORDER — FENTANYL CITRATE (PF) 100 MCG/2ML IJ SOLN
25.0000 ug | INTRAMUSCULAR | Status: DC | PRN
  Administered 2019-11-21: 50 ug via INTRAVENOUS

## 2019-11-21 MED ORDER — CEFAZOLIN SODIUM-DEXTROSE 2-4 GM/100ML-% IV SOLN
INTRAVENOUS | Status: AC
  Filled 2019-11-21: qty 100

## 2019-11-21 MED ORDER — DEXAMETHASONE SODIUM PHOSPHATE 10 MG/ML IJ SOLN
INTRAMUSCULAR | Status: DC | PRN
  Administered 2019-11-21: 5 mg via INTRAVENOUS

## 2019-11-21 MED ORDER — CELECOXIB 200 MG PO CAPS
400.0000 mg | ORAL_CAPSULE | ORAL | Status: AC
  Administered 2019-11-21: 400 mg via ORAL

## 2019-11-21 MED ORDER — OXYCODONE HCL 5 MG PO TABS: 5.0000 mg | ORAL_TABLET | Freq: Once | ORAL | Status: DC | PRN

## 2019-11-21 MED ORDER — OXYCODONE HCL 5 MG/5ML PO SOLN: 5.0000 mg | Freq: Once | ORAL | Status: DC | PRN

## 2019-11-21 MED ORDER — LACTATED RINGERS IV SOLN: INTRAVENOUS | Status: DC

## 2019-11-21 MED ORDER — CELECOXIB 200 MG PO CAPS
ORAL_CAPSULE | ORAL | Status: AC
  Filled 2019-11-21: qty 2

## 2019-11-21 MED ORDER — FENTANYL CITRATE (PF) 100 MCG/2ML IJ SOLN
INTRAMUSCULAR | Status: DC | PRN
Start: 2019-11-21 — End: 2019-11-21
  Administered 2019-11-21: 100 ug via INTRAVENOUS

## 2019-11-21 MED ORDER — ROCURONIUM BROMIDE 10 MG/ML (PF) SYRINGE
PREFILLED_SYRINGE | INTRAVENOUS | Status: AC
  Filled 2019-11-21: qty 10

## 2019-11-21 MED ORDER — ROCURONIUM BROMIDE 100 MG/10ML IV SOLN
INTRAVENOUS | Status: DC | PRN
  Administered 2019-11-21: 60 mg via INTRAVENOUS

## 2019-11-21 MED ORDER — SODIUM CHLORIDE 0.9 % IR SOLN
Status: DC | PRN
  Administered 2019-11-21: 1000 mL

## 2019-11-21 MED ORDER — GABAPENTIN 300 MG PO CAPS
ORAL_CAPSULE | ORAL | Status: AC
  Filled 2019-11-21: qty 1

## 2019-11-21 MED ORDER — METOPROLOL TARTRATE 25 MG PO TABS
ORAL_TABLET | ORAL | Status: AC
  Filled 2019-11-21: qty 2

## 2019-11-21 MED ORDER — MIDAZOLAM HCL 2 MG/2ML IJ SOLN
INTRAMUSCULAR | Status: AC
  Filled 2019-11-21: qty 2

## 2019-11-21 MED ORDER — CEFAZOLIN SODIUM-DEXTROSE 2-4 GM/100ML-% IV SOLN
2.0000 g | INTRAVENOUS | Status: AC
  Administered 2019-11-21: 2 g via INTRAVENOUS

## 2019-11-21 MED ORDER — ONDANSETRON HCL 4 MG/2ML IJ SOLN
INTRAMUSCULAR | Status: DC | PRN
  Administered 2019-11-21: 4 mg via INTRAVENOUS

## 2019-11-21 MED ORDER — ACETAMINOPHEN 500 MG PO TABS
1000.0000 mg | ORAL_TABLET | ORAL | Status: AC
  Administered 2019-11-21: 1000 mg via ORAL

## 2019-11-21 MED ORDER — ACETAMINOPHEN 325 MG PO TABS: 325.0000 mg | ORAL_TABLET | ORAL | Status: DC | PRN

## 2019-11-21 MED ORDER — ONDANSETRON HCL 4 MG/2ML IJ SOLN: 4.0000 mg | Freq: Once | INTRAMUSCULAR | Status: DC | PRN

## 2019-11-21 MED ORDER — METOPROLOL TARTRATE 25 MG PO TABS
50.0000 mg | ORAL_TABLET | Freq: Once | ORAL | Status: AC
  Administered 2019-11-21: 50 mg via ORAL

## 2019-11-21 MED ORDER — ACETAMINOPHEN 500 MG PO TABS
ORAL_TABLET | ORAL | Status: AC
  Filled 2019-11-21: qty 2

## 2019-11-21 SURGICAL SUPPLY — 42 items
ADH SKN CLS APL DERMABOND .7 (GAUZE/BANDAGES/DRESSINGS) ×1
APL PRP STRL LF DISP 70% ISPRP (MISCELLANEOUS) ×1
APPLIER CLIP 5 13 M/L LIGAMAX5 (MISCELLANEOUS) ×2
APR CLP MED LRG 5 ANG JAW (MISCELLANEOUS) ×1
BAG SPEC RTRVL LRG 6X4 10 (ENDOMECHANICALS) ×1
BLADE CLIPPER SURG (BLADE) IMPLANT
CHLORAPREP W/TINT 26 (MISCELLANEOUS) ×2 IMPLANT
CLIP APPLIE 5 13 M/L LIGAMAX5 (MISCELLANEOUS) ×1 IMPLANT
COVER MAYO STAND STRL (DRAPES) IMPLANT
COVER WAND RF STERILE (DRAPES) IMPLANT
DECANTER SPIKE VIAL GLASS SM (MISCELLANEOUS) ×2 IMPLANT
DERMABOND ADVANCED (GAUZE/BANDAGES/DRESSINGS) ×1
DERMABOND ADVANCED .7 DNX12 (GAUZE/BANDAGES/DRESSINGS) ×1 IMPLANT
DRAPE C-ARM 42X72 X-RAY (DRAPES) IMPLANT
DRAPE LAPAROSCOPIC ABDOMINAL (DRAPES) ×2 IMPLANT
ELECT REM PT RETURN 9FT ADLT (ELECTROSURGICAL) ×2
ELECTRODE REM PT RTRN 9FT ADLT (ELECTROSURGICAL) ×1 IMPLANT
GLOVE BIO SURGEON STRL SZ 6.5 (GLOVE) ×1 IMPLANT
GLOVE BIO SURGEON STRL SZ7 (GLOVE) ×1 IMPLANT
GLOVE BIOGEL PI IND STRL 7.0 (GLOVE) IMPLANT
GLOVE BIOGEL PI INDICATOR 7.0 (GLOVE) ×3
GLOVE SURG SIGNA 7.5 PF LTX (GLOVE) ×2 IMPLANT
GOWN STRL REUS W/ TWL LRG LVL3 (GOWN DISPOSABLE) ×2 IMPLANT
GOWN STRL REUS W/ TWL XL LVL3 (GOWN DISPOSABLE) ×1 IMPLANT
GOWN STRL REUS W/TWL LRG LVL3 (GOWN DISPOSABLE) ×4
GOWN STRL REUS W/TWL XL LVL3 (GOWN DISPOSABLE) ×2
NS IRRIG 1000ML POUR BTL (IV SOLUTION) ×2 IMPLANT
PACK BASIN DAY SURGERY FS (CUSTOM PROCEDURE TRAY) ×2 IMPLANT
POUCH SPECIMEN RETRIEVAL 10MM (ENDOMECHANICALS) ×2 IMPLANT
SCISSORS LAP 5X35 DISP (ENDOMECHANICALS) ×2 IMPLANT
SET CHOLANGIOGRAPH 5 50 .035 (SET/KITS/TRAYS/PACK) IMPLANT
SET IRRIG TUBING LAPAROSCOPIC (IRRIGATION / IRRIGATOR) ×2 IMPLANT
SET TUBE SMOKE EVAC HIGH FLOW (TUBING) ×2 IMPLANT
SLEEVE ENDOPATH XCEL 5M (ENDOMECHANICALS) ×4 IMPLANT
SLEEVE SCD COMPRESS KNEE MED (MISCELLANEOUS) ×2 IMPLANT
SPECIMEN JAR SMALL (MISCELLANEOUS) ×2 IMPLANT
SUT MON AB 4-0 PC3 18 (SUTURE) ×2 IMPLANT
TOWEL GREEN STERILE FF (TOWEL DISPOSABLE) ×2 IMPLANT
TRAY LAPAROSCOPIC (CUSTOM PROCEDURE TRAY) ×2 IMPLANT
TROCAR XCEL BLUNT TIP 100MML (ENDOMECHANICALS) ×2 IMPLANT
TROCAR XCEL NON-BLD 5MMX100MML (ENDOMECHANICALS) ×2 IMPLANT
TUBE CONNECTING 20X1/4 (TUBING) ×2 IMPLANT

## 2019-11-21 NOTE — Anesthesia Procedure Notes (Signed)
Procedure Name: Intubation Date/Time: 11/21/2019 8:38 AM Performed by: [place] Ngina Royer C, CRNA Pre-anesthesia Checklist: Patient identified, Emergency Drugs available, Suction available and Patient being monitored Patient Re-evaluated:Patient Re-evaluated prior to induction Oxygen Delivery Method: Circle system utilized Preoxygenation: Pre-oxygenation with 100% oxygen Induction Type: IV induction Ventilation: Mask ventilation without difficulty Laryngoscope Size: Mac and 3 Grade View: Grade II Tube type: Oral Tube size: 7.0 mm Number of attempts: 1 Airway Equipment and Method: Stylet and Oral airway Placement Confirmation: ETT inserted through vocal cords under direct vision,  positive ETCO2 and breath sounds checked- equal and bilateral Secured at: 21 cm Tube secured with: Tape Dental Injury: Teeth and Oropharynx as per pre-operative assessment

## 2019-11-21 NOTE — Op Note (Signed)

## 2019-11-21 NOTE — Anesthesia Postprocedure Evaluation (Signed)
Anesthesia Post Note  Patient: Bridget Anderson  Procedure(s) Performed: LAPAROSCOPIC CHOLECYSTECTOMY (N/A Abdomen)     Patient location during evaluation: PACU Anesthesia Type: General Level of consciousness: awake and alert Pain management: pain level controlled Vital Signs Assessment: post-procedure vital signs reviewed and stable Respiratory status: spontaneous breathing, nonlabored ventilation, respiratory function stable and patient connected to nasal cannula oxygen Cardiovascular status: blood pressure returned to baseline and stable Postop Assessment: no apparent nausea or vomiting Anesthetic complications: no   No complications documented.  Last Vitals:  Vitals:   11/21/19 0956 11/21/19 1039  BP: (!) 142/81 (!) 159/85  Pulse: 61 (!) 58  Resp: 14 14  Temp:  36.9 C  SpO2: 100% 97%    Last Pain:  Vitals:   11/21/19 1039  TempSrc:   PainSc: Asleep                 Denine Brotz

## 2019-11-21 NOTE — Interval H&P Note (Signed)
History and Physical Interval Note: no change in H and P  11/21/2019 7:07 AM  Bridget Anderson  has presented today for surgery, with the diagnosis of symptomatic gallstones.  The various methods of treatment have been discussed with the patient and family. After consideration of risks, benefits and other options for treatment, the patient has consented to  Procedure(s): LAPAROSCOPIC CHOLECYSTECTOMY (N/A) as a surgical intervention.  The patient's history has been reviewed, patient examined, no change in status, stable for surgery.  I have reviewed the patient's chart and labs.  Questions were answered to the patient's satisfaction.     Coralie Keens

## 2019-11-21 NOTE — Transfer of Care (Signed)
Immediate Anesthesia Transfer of Care Note  Patient: Bridget Anderson  Procedure(s) Performed: LAPAROSCOPIC CHOLECYSTECTOMY (N/A Abdomen)  Patient Location: PACU  Anesthesia Type:General  Level of Consciousness: awake and drowsy  Airway & Oxygen Therapy: Patient Spontanous Breathing and Patient connected to face mask oxygen  Post-op Assessment: Report given to RN and Post -op Vital signs reviewed and stable  Post vital signs: Reviewed and stable  Last Vitals:  Vitals Value Taken Time  BP 175/88 11/21/19 0922  Temp    Pulse 71 11/21/19 0923  Resp 13 11/21/19 0923  SpO2 100 % 11/21/19 0923  Vitals shown include unvalidated device data.  Last Pain:  Vitals:   11/21/19 0725  TempSrc: Oral  PainSc: 0-No pain         Complications: No complications documented.

## 2019-11-21 NOTE — Discharge Instructions (Signed)
[facility] ______[facility] LAPAROSCOPIC SURGERY: POST OP INSTRUCTIONS Always review your discharge instruction sheet given to you by the facility where your surgery was performed. IF YOU HAVE DISABILITY OR FAMILY LEAVE FORMS, YOU MUST BRING THEM TO THE OFFICE FOR PROCESSING.   DO NOT GIVE THEM TO YOUR DOCTOR.  1. A prescription for pain medication may be given to you upon discharge.  Take your pain medication as prescribed, if needed.  If narcotic pain medicine is not needed, then you may take acetaminophen (Tylenol) or ibuprofen (Advil) as needed. 2. Take your usually prescribed medications unless otherwise directed. 3. If you need a refill on your pain medication, please contact your pharmacy.  They will contact our office to request authorization. Prescriptions will not be filled after 5pm or on week-ends. 4. You should follow a light diet the first few days after arrival home, such as soup and crackers, etc.  Be sure to include lots of fluids daily. 5. Most patients will experience some swelling and bruising in the area of the incisions.  Ice packs will help.  Swelling and bruising can take several days to resolve.  6. It is common to experience some constipation if taking pain medication after surgery.  Increasing fluid intake and taking a stool softener (such as Colace) will usually help or prevent this problem from occurring.  A mild laxative (Milk of Magnesia or Miralax) should be taken according to package instructions if there are no bowel movements after 48 hours. 7. Unless discharge instructions indicate otherwise, you may remove your bandages 24-48 hours after surgery, and you may shower at that time.  You may have steri-strips (small skin tapes) in place directly over the incision.  These strips should be left on the skin for 7-10 days.  If your surgeon used skin glue on the incision, you may shower in 24 hours.  The glue will flake off over the next 2-3 weeks.  Any sutures or  staples will be removed at the office during your follow-up visit. 8. ACTIVITIES:  You may resume regular (light) daily activities beginning the next day--such as daily self-care, walking, climbing stairs--gradually increasing activities as tolerated.  You may have sexual intercourse when it is comfortable.  Refrain from any heavy lifting or straining until approved by your doctor. a. You may drive when you are no longer taking prescription pain medication, you can comfortably wear a seatbelt, and you can safely maneuver your car and apply brakes. b. RETURN TO WORK:  __________________________________________________________ 9. You should see your doctor in the office for a follow-up appointment approximately 2-3 weeks after your surgery.  Make sure that you call for this appointment within a day or two after you arrive home to insure a convenient appointment time. 10. OTHER INSTRUCTIONS:OK TO SHOWER STARTING TOMORROW 11. ICE PACK, TYLENOL, AND IBUPROFEN ALSO FOR PAIN 12. NO LIFTING MORE THAN 15 POUNDS FOR 2 WEEKS __________________________________________________________________________________________________________________________ __________________________________________________________________________________________________________________________ WHEN TO CALL YOUR DOCTOR: 1. Fever over 101.0 2. Inability to urinate 3. Continued bleeding from incision. 4. Increased pain, redness, or drainage from the incision. 5. Increasing abdominal pain  The clinic staff is available to answer your questions during regular business hours.  Please don't hesitate to call and ask to speak to one of the nurses for clinical concerns.  If you have a medical emergency, go to the nearest emergency room or call 911.  A surgeon from [facility] is always on call at the hospital. [address], [address],   [place], [place]  [address] ? [address] [address], [place], [place]   [address] [phone] ?  [phone] ? FAX [phone] Web site: [email or web address] 

## 2019-11-21 NOTE — Anesthesia Preprocedure Evaluation (Addendum)
Anesthesia Evaluation  Patient identified by MRN, date of birth, ID band Patient awake    Reviewed: Allergy & Precautions, H&P , NPO status , Patient's Chart, lab work & pertinent test results, reviewed documented beta blocker date and time   Airway Mallampati: II  TM Distance: >3 FB Neck ROM: full    Dental no notable dental hx.    Pulmonary asthma , sleep apnea ,    Pulmonary exam normal breath sounds clear to auscultation       Cardiovascular Exercise Tolerance: Good hypertension, Pt. on medications and Pt. on home beta blockers  Rhythm:regular Rate:Normal     Neuro/Psych  Headaches, PSYCHIATRIC DISORDERS    GI/Hepatic Neg liver ROS, GERD  Medicated,  Endo/Other  diabetes, Type 2Morbid obesity  Renal/GU negative Renal ROS  negative genitourinary   Musculoskeletal   Abdominal   Peds  Hematology negative hematology ROS (+)   Anesthesia Other Findings   Reproductive/Obstetrics negative OB ROS                            Anesthesia Physical Anesthesia Plan  ASA: III  Anesthesia Plan: General   Post-op Pain Management:    Induction: Intravenous  PONV Risk Score and Plan: 3  Airway Management Planned: Oral ETT and LMA  Additional Equipment: None  Intra-op Plan:   Post-operative Plan: Extubation in OR  Informed Consent: I have reviewed the patients History and Physical, chart, labs and discussed the procedure including the risks, benefits and alternatives for the proposed anesthesia with the patient or authorized representative who has indicated his/her understanding and acceptance.     Dental Advisory Given  Plan Discussed with: CRNA, Anesthesiologist and Surgeon  Anesthesia Plan Comments: (  )        Anesthesia Quick Evaluation

## 2019-11-22 LAB — SURGICAL PATHOLOGY

## 2019-11-25 ENCOUNTER — Encounter (HOSPITAL_BASED_OUTPATIENT_CLINIC_OR_DEPARTMENT_OTHER): Payer: Self-pay | Admitting: Surgery

## 2019-11-28 NOTE — Telephone Encounter (Signed)
Received another request for Ajovy PA. I completed this on Cover My Meds requesting initial authorization. Key: [id number]. Awaiting determination from [facility]. Pt should be able to keep using the savings card to get this even if insurance denies. Pt has a pending appt next month.

## 2019-11-28 NOTE — Telephone Encounter (Signed)
Per Cover My Meds, Ajovy denied. Pt should be able to keep using the savings card.   The requested medication and/or diagnosis are not a covered benefit and excluded from coverage in accordance with the terms and conditions of your plan benefit. Therefore, the request has been administratively denied.  To file an appeal, please send any written comments, documents or other relevant documentation with your appeal to the address listed below: [facility] [place] [address] [place] [address] Phone: Please call the toll-free member number listed on your health plan ID card. Fax: [phone] Expedited / Urgent Fax: [phone]  Case number: [id number]

## 2019-12-26 ENCOUNTER — Ambulatory Visit: Payer: 59 | Admitting: Neurology

## 2020-02-26 ENCOUNTER — Other Ambulatory Visit: Payer: Self-pay

## 2020-02-26 ENCOUNTER — Encounter: Payer: Self-pay | Admitting: Physical Medicine & Rehabilitation

## 2020-02-26 ENCOUNTER — Encounter
Payer: No Typology Code available for payment source | Attending: Physical Medicine & Rehabilitation | Admitting: Physical Medicine & Rehabilitation

## 2020-02-26 VITALS — BP 163/105 | HR 92 | Temp 98.4°F | Ht 66.0 in | Wt 213.0 lb

## 2020-02-26 DIAGNOSIS — F0781 Postconcussional syndrome: Secondary | ICD-10-CM | POA: Diagnosis not present

## 2020-02-26 DIAGNOSIS — R411 Anterograde amnesia: Secondary | ICD-10-CM | POA: Diagnosis not present

## 2020-02-26 DIAGNOSIS — G44329 Chronic post-traumatic headache, not intractable: Secondary | ICD-10-CM | POA: Diagnosis not present

## 2020-02-26 MED ORDER — TOPIRAMATE 25 MG PO TABS: 25.0000 mg | ORAL_TABLET | Freq: Two times a day (BID) | ORAL | 3 refills | Status: DC

## 2020-02-26 NOTE — Progress Notes (Signed)
Subjective:    Patient ID: Bridget Anderson, female    DOB: [birth year], 50 y.o.   MRN: [id number]  HPI   Bridget Anderson is here in follow up of her PCS and associated deficits. She had a cholecystectomy on 9/30 by Dr. Ninfa Linden. Otherwise her healthcare status has been stable.   Overall her blood pressure has been much better controlled and her most recent hemoglobin A1c was less than 6.  Her sister reports that she has not had a severe headache in quite a while although she is having a daily headache.  She does take Tylenol which helps somewhat with this.  Pain is diffuse and constant over the crown of her head sometimes in the occipital regions as well.  She uses her Fioricet for severe headaches.  She is not on any prophylactic medication at this point.  From a memory standpoint there have been some subtle improvements.  Bridget Anderson feels there has been some positive changes.  She does admit to actually having some memory difficulties which is a first for her.  Sister is at home with her currently as she has been out of work due to healthcare reasons.  Someone is in the house all the time with her currently.  Bridget Anderson states that she does do some simple meal prep.  Otherwise she watches TV.  She still has a boyfriend.  She recalled some recent family activities that she participated in.  She also reported to me some of her medications she uses for her blood pressure.     Pain Inventory Average Pain 7 Pain Right Now 3 My pain is ?  LOCATION OF PAIN  Head, knee, foot  BOWEL Number of stools per week: normal Oral laxative use No  Type of laxative n/a Enema or suppository use No  History of colostomy No  Incontinent No   BLADDER Normal In and out cath, frequency n/a Able to self cath No  Bladder incontinence No  Frequent urination No  Leakage with coughing No  Difficulty starting stream No  Incomplete bladder emptying No    Mobility walk without assistance ability to climb steps?  yes do you  drive?  yes  Function employed # of hrs/week .  Neuro/Psych No problems in this area  Prior Studies Any changes since last visit?  no  Physicians involved in your care Any changes since last visit?  no   Family History  Problem Relation Age of Onset  . Hypertension Mother   . Hyperlipidemia Mother   . Hypertension Father   . Hyperlipidemia Father   . Thyroid disease Father   . Hypertension Brother   . Migraines Maternal Aunt    Social History   Socioeconomic History  . Marital status: Single    Spouse name: Not on file  . Number of children: 2  . Years of education: Not on file  . Highest education level: Associate degree: academic program  Occupational History  . Occupation: Administrator, arts  Tobacco Use  . Smoking status: Never Smoker  . Smokeless tobacco: Never Used  Vaping Use  . Vaping Use: Never used  Substance and Sexual Activity  . Alcohol use: Yes    Comment: Rarely  . Drug use: No  . Sexual activity: Yes    Birth control/protection: Surgical  Other Topics Concern  . Not on file  Social History Narrative   Lives at home with her youngest daughter    Right handed   Drinks < 3 cups of caffeine  daily   Social Determinants of Health   Financial Resource Strain: Not on file  Food Insecurity: Not on file  Transportation Needs: Not on file  Physical Activity: Not on file  Stress: Not on file  Social Connections: Not on file   Past Surgical History:  Procedure Laterality Date  . ABDOMINAL HYSTERECTOMY N/A 07/16/2013   Procedure: HYSTERECTOMY ABDOMINAL;  Surgeon: Allyn Kenner, DO;  Location: [facility];  Service: Gynecology;  Laterality: N/A;  . BILATERAL SALPINGECTOMY Bilateral 07/16/2013   Procedure: BILATERAL SALPINGECTOMY;  Surgeon: Allyn Kenner, DO;  Location: [facility];  Service: Gynecology;  Laterality: Bilateral;  . CARPAL TUNNEL RELEASE  01/16/2012   Procedure: CARPAL TUNNEL RELEASE;  Surgeon: Nita Sells, MD;  Location: [facility];  Service: Orthopedics;  Laterality: Left;  . CARPAL TUNNEL RELEASE  02/20/2012   Procedure: CARPAL TUNNEL RELEASE;  Surgeon: Nita Sells, MD;  Location: [facility];  Service: Orthopedics;  Laterality: Right;  . CHOLECYSTECTOMY N/A 11/21/2019   Procedure: LAPAROSCOPIC CHOLECYSTECTOMY;  Surgeon: Coralie Keens, MD;  Location: [facility];  Service: General;  Laterality: N/A;  . COLONOSCOPY    . CYSTO WITH HYDRODISTENSION N/A 07/16/2013   Procedure: CYSTOSCOPY/HYDRODISTENSION;  Surgeon: Reece Packer, MD;  Location: [facility];  Service: Urology;  Laterality: N/A;  . DILATION AND CURETTAGE OF UTERUS    . FOOT SURGERY    . HYSTEROSCOPY WITH D & C  10/31/2011   Procedure: DILATATION AND CURETTAGE /HYSTEROSCOPY;  Surgeon: Allyn Kenner, DO;  Location: [facility];  Service: Gynecology;  Laterality: N/A;  . LAPAROSCOPY N/A 07/16/2013   Procedure: LAPAROSCOPY DIAGNOSTIC;  Surgeon: Allyn Kenner, DO;  Location: [facility];  Service: Gynecology;  Laterality: N/A;  . PLANTAR FASCIA SURGERY    . PUBOVAGINAL SLING N/A 07/16/2013   Procedure: Gaynelle Arabian;  Surgeon: Reece Packer, MD;  Location: [facility];  Service: Urology;  Laterality: N/A;  . TUBAL LIGATION    . VAGINAL DELIVERY  1995,  2003   Past Medical History:  Diagnosis Date  . Chlamydia   . Diabetes ([place])   . GERD (gastroesophageal reflux disease)   . Hyperlipidemia   . Hypertension   . Migraine   . Obstructive sleep apnea on CPAP    pt has not worn CPAP in years  . Seasonal allergies   . Wears glasses    BP (!) 163/105   Pulse 92   Temp 98.4 F (36.9 C)   Ht 5\' 6"  (1.676 m)   Wt 213 lb (96.6 kg)   LMP 06/27/2013   SpO2 97%   BMI 34.38 kg/m   Opioid Risk Score:   Fall Risk Score:  `1  Depression screen PHQ 2/9  Depression screen [facility] 2/9 10/23/2019 05/23/2018 03/07/2018 11/20/2017 05/25/2017  Decreased Interest 0 0 0 2 3  Down, Depressed, Hopeless 0 0 0 2 1  PHQ - 2  Score 0 0 0 4 4  Altered sleeping - - - 3 2  Tired, decreased energy - - - 2 3  Change in appetite - - - 2 1  Feeling bad or failure about yourself  - - - 3 0  Trouble concentrating - - - 3 3  Moving slowly or fidgety/restless - - - 2 0  Suicidal thoughts - - - 1 0  PHQ-9 Score - - - 20 13  Difficult doing work/chores - - - Extremely dIfficult Not difficult at all    Review of Systems  Constitutional: Negative.   HENT: Negative.   Eyes: Negative.   Respiratory: Negative.   Cardiovascular: Negative.   Gastrointestinal: Negative.   Endocrine: Negative.   Genitourinary: Negative.   Musculoskeletal: Positive for arthralgias.  Skin: Negative.   Allergic/Immunologic: Negative.   Neurological: Positive for headaches.  Hematological: Negative.   Psychiatric/Behavioral: Negative.   All other systems reviewed and are negative.      Objective:   Physical Exam General: No acute distress HEENT: EOMI, oral membranes moist Cards: reg rate  Chest: normal effort Abdomen: Soft, NT, ND Skin: dry, intact Extremities: no edema Neuro:  Improved attention.  Does seem to demonstrate better awareness of her deficits.  Decreased STM but recalled some of medications and pieces of recent events.  Strength is 5 out of 5.  Cranial nerve exam is unremarkable.  Sensory exam intact good balance .  Musculoskeletal:  mild occipital/crown discomfort.  Psych: pleasant  and seems to be in good spirits today             Assessment & Plan:  1. Post concussion syndrome with ongoing headaches, memory loss, tinnitus, vestibular symptoms, insomnia and emotional lability. I see generalized improvements since our last visit.   -would be worthwhile to perform a neuropsych assessment to examine where Shemia is at compared to when she was with [place] on inpatient rehab. Dr. Kieth Brightly saw her during that admission and could perform this examination.   -Do not think a vocational rehab assessment is appropriate until  we see the results of a neuropsychological evaluation.  -Continue Aricept for memory as I do see some improvement on this medication.  She has demonstrated subtle improvement over our last few visits in fact although there are significant deficits still.  -Spoke with her sister about giving Lakeisha some freedom at home and perhaps leaving the house for short periods of time to see how Talar does from a safety standpoint. 2.  History of migraine headaches since her middle school years.  Patient had been seen twice by neurology here in [place].  Apparently she has been on numerous medications in the past for prophylaxis and treatment.    -They are [facility] better controlled with improved BP control  -topamax 25mg  qhs and then bid for chronic daily headaches  -May use Fioricet or Tylenol for breakthrough headaches 3. Uncontrolled hypertension--much improved  4. DM II--improved control   Fifteen minutes of face to face patient care time were spent during this visit. All questions were encouraged and answered.  Follow up with me in 3 mos . Reviewed with [facility] Case manager also    .

## 2020-02-26 NOTE — Patient Instructions (Addendum)
PLEASE FEEL FREE TO CALL OUR OFFICE WITH ANY PROBLEMS OR QUESTIONS [phone])   TOPAMAX: TAKE ONE AT BEDTIME FOR ONE WEEK THEN ONCE TWICE DAILY THEREAFTER.

## 2020-05-13 ENCOUNTER — Telehealth: Payer: Self-pay | Admitting: Psychology

## 2020-05-13 NOTE — Telephone Encounter (Signed)
[facility] case manager Campbell Stall  would like a call back in reference to last visit with Dr. Naaman Plummer he put in his note that patient would benefit for another eval with Dr. Sima Matas.  Please let us know if patient may need more testing with you.  Please call Melissa back at [phone].

## 2020-05-25 ENCOUNTER — Telehealth: Payer: Self-pay | Admitting: *Deleted

## 2020-05-25 NOTE — Telephone Encounter (Signed)
[name] called to ask about appts. Bridget Anderson has appt Wednesday with Dr Naaman Plummer. She then has an appt Thursday with Dr Sima Matas and subsequent testing after that.  Bridget Anderson is wanting to know if DR Naaman Plummer wants to see her on Wednesday or wait until the visit with Dr Sima Matas and the testing is completed. Please advise.

## 2020-05-25 NOTE — Telephone Encounter (Signed)
Appt cancelled for 05/27/20  and they will reschedule after testing has been done.

## 2020-05-25 NOTE — Telephone Encounter (Signed)
I'm fine if she wants to be rescheduled to a later date where we could discuss Dr. Ferne Coe visit

## 2020-05-27 ENCOUNTER — Encounter: Payer: No Typology Code available for payment source | Admitting: Physical Medicine & Rehabilitation

## 2020-05-28 ENCOUNTER — Other Ambulatory Visit: Payer: Self-pay

## 2020-05-28 ENCOUNTER — Encounter: Payer: No Typology Code available for payment source | Attending: Psychology | Admitting: Psychology

## 2020-05-28 ENCOUNTER — Encounter: Payer: Self-pay | Admitting: Psychology

## 2020-05-28 DIAGNOSIS — G43711 Chronic migraine without aura, intractable, with status migrainosus: Secondary | ICD-10-CM | POA: Insufficient documentation

## 2020-05-28 DIAGNOSIS — F0781 Postconcussional syndrome: Secondary | ICD-10-CM | POA: Diagnosis not present

## 2020-05-28 DIAGNOSIS — S069X0S Unspecified intracranial injury without loss of consciousness, sequela: Secondary | ICD-10-CM | POA: Diagnosis present

## 2020-05-28 DIAGNOSIS — G44329 Chronic post-traumatic headache, not intractable: Secondary | ICD-10-CM | POA: Insufficient documentation

## 2020-05-28 DIAGNOSIS — R4689 Other symptoms and signs involving appearance and behavior: Secondary | ICD-10-CM | POA: Insufficient documentation

## 2020-05-28 DIAGNOSIS — R411 Anterograde amnesia: Secondary | ICD-10-CM | POA: Insufficient documentation
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
## 2020-06-09 ENCOUNTER — Encounter: Payer: Self-pay | Admitting: Psychology

## 2020-06-09 ENCOUNTER — Encounter: Payer: No Typology Code available for payment source | Attending: Psychology | Admitting: Psychology

## 2020-06-09 ENCOUNTER — Other Ambulatory Visit: Payer: Self-pay

## 2020-06-09 DIAGNOSIS — G44329 Chronic post-traumatic headache, not intractable: Secondary | ICD-10-CM

## 2020-06-09 DIAGNOSIS — G43711 Chronic migraine without aura, intractable, with status migrainosus: Secondary | ICD-10-CM | POA: Diagnosis not present

## 2020-06-09 DIAGNOSIS — F0781 Postconcussional syndrome: Secondary | ICD-10-CM | POA: Diagnosis not present

## 2020-06-09 DIAGNOSIS — R411 Anterograde amnesia: Secondary | ICD-10-CM

## 2020-06-09 DIAGNOSIS — R4689 Other symptoms and signs involving appearance and behavior: Secondary | ICD-10-CM

## 2020-06-09 DIAGNOSIS — S069X0S Unspecified intracranial injury without loss of consciousness, sequela: Secondary | ICD-10-CM | POA: Insufficient documentation

## 2020-06-09 NOTE — Progress Notes (Addendum)
Neuropsychology Note  Bridget Anderson completed 240 minutes of neuropsychological testing with this provider. She was appropriately dressed and well groomed. She was partially oriented but gave incorrect year, month, day of week and month. She correctly named current [employer] but identified "Obama" as his predecessor. Mood appeared anxious and depressed. The patient did appear overtly distressed at times, especially on measures of attention and learning and memory, per behavioral observation and via self-report. She required instructions and test items to be repeated often.   She attempted to leave early on a 5-minute break and brought back by sister to complete testing per my request. Completed all assigned tasks.   Clinical Decision Making: Repeat testing battery from 04/2018. Changes were made as deemed necessary based on patient performance on testing, behavioral observations and additional pertinent factors such as those listed above.  Tests Administered:  [employer], 3rd Edition (CVLT-3); Standard Form   Test of Malingering Memory (TOMM)   Wechsler Adult Intelligence Scale, 4th Edition (WAIS-IV)  Wechsler Memory Scale, 4th Edition (WMS-IV); Adult Battery, Select Subtests   Results:  Composite Score Summary  Scale Sum of Scaled Scores Composite Score Percentile Rank 95% Conf. Interval Qualitative Description  Verbal Comprehension 21 VCI 83 13 78-89 Low Average  Perceptual Reasoning 31 PRI 102 55 96-108 Average  Working Memory 11 WMI 74 4 69-82 Borderline  Processing Speed 19 PSI 97 42 89-106 Average  Full Scale 82 FSIQ 87 19 83-91 Low Average  General Ability 52 GAI 92 30 87-97 Average    Verbal Comprehension Subtests Summary  Subtest Raw Score Scaled Score Percentile Rank Reference Group Scaled Score SEM  Similarities 22 8 25 9  1.04  Vocabulary 26 7 16 8  0.73  Information 7 6 9 6  0.73   Perceptual Reasoning Subtests Summary  Subtest Raw Score  Scaled Score Percentile Rank Reference Group Scaled Score SEM  Block Design 32 8 25 7  0.95  Matrix Reasoning 18 11 63 9 0.95  Visual Puzzles 16 12 75 10 0.85   Working Doctor, general practice Raw Score Scaled Score Percentile Rank Reference Group Scaled Score SEM  Digit Span 17 5 5 4  0.73  Arithmetic 9 6 9 6  0.90    Working Memory Process Score Summary  Process Score Raw Score Scaled Score Percentile Rank Base Rate SEM  Digit Span Forward 6 5 5  -- 1.24  Digit Span Backward 7 8 25  -- 1.12  Digit Span Sequencing 4 5 5  -- 1.27  Longest Digit Span Forward 5 -- -- 96.0 --  Longest Digit Span Backward 4 -- -- 82.0 --  Longest Digit Span Sequence 3 -- -- 97.5 --   Processing Speed Subtests Summary  Subtest Raw Score Scaled Score Percentile Rank Reference Group Scaled Score SEM  Symbol Search 23 7 16 6  1.56  Coding 78 12 75 11 1.20    WMS-IV Brief Cognitive Status Exam Classification  Age Years of Education Raw Score Classification Level Base Rate  49 years 10 months 16 29 Very Low 0.0    Primary Subtest Scaled Score Summary  Subtest Domain Raw Score Scaled Score Percentile Rank  Logical Memory I AM 12 4 2   Logical Memory II AM 5 3 1   Visual Reproduction I VM 25 5 5   Visual Reproduction II VM 0 1 0.1  Symbol Span VWM 22 10 50   Auditory Memory Process Score Summary  Process Score Raw Score Scaled Score Percentile Rank Cumulative Percentage (Base Rate)  LM II Recognition 18 - - 3-9%   Visual Memory Process Score Summary  Process Score Raw Score Scaled Score Percentile Rank Cumulative Percentage (Base Rate)  VR II Recognition 2 - - <=2%    CVLT-3 (Standard Form) Index Sum of scaled scores Index score Percentile rank    Trials 1-5 Correct 21 64 1    Delayed Recall Correct 15 65 1    Total Recall Correct 44 63 1     Immediate Recall Score Raw score Scaled score Percentile rank    Trial 5 Semantic Clustering (Chance Adjusted) -0.2 8 25     Trials 1-5  Semantic Clustering (Chance Adjusted) -0.3 7 16     Trials 1-5 Serial Clustering (Chance Adjusted) 0 8 25    Trials 1-5 % Recall Primacy 26 9 37    Trials 1-5 % Recall Middle 39 8 25    Trials 1-5 % Recall Recency 35 13 84    Trials 1-5 Recall Consistency 63 5 5    Trials 1-5 Learning Slope Analysis 0.7 7 16     Trials 1-2 Learning Slope Analysis -1 4 2     Trials 2-5 Learning Slope Analysis 1.1 11 63    Trials 1-5 Recall Discriminability 0.8 2 0.4      Delayed Recall Score Raw score Scaled score Percentile rank    Short Delay Free Recall Semantic Clustering (Chance Adjusted) -0.2 7 16     Short Delay Free Recall Discriminability -0.8 1 0.1    Short Delay Cued Recall Discriminability -0.8 1 0.1    Long Delay Free Recall Discriminability 0 2 0.4    Long Delay Cued Recall Discriminability -1 2 0.4    Cued Recall Discriminability -0.9 1 0.1    Delayed Recall Discriminability -0.6 1 0.1    Total Recall Discriminability 0.1 1 0.1     Intrusion Errors Score Raw score Scaled score Percentile rank    Trials 1-5 7 4 2     Free Recall 14 3 1     Cued Recall 24 1 0.1    Delayed Recall 30 1 0.1     Repetition Errors Score Raw score Scaled score Percentile rank    Total Repetitions 13 7 16     Total Target Repetitions 10 8 25      Forced Choice Recognition Score Raw score Base rate    Number of Target Words Recalled on Immediate/Delay but Missed on Forced Choice Recognition 3 <=2.0    Number of Hits on Yes/No Recognition but Missed on Forced Choice Recognition 5 <=2.0          Raw  Description TOMM  Trial 1:  29/50  Below Expectation  Trial 2:  23/50  Below Expectation  Retention:  27/50  Below Expectation    Feedback to Patient:  Bridget Anderson will return within approximately 2 weeks for an interactive feedback session with Dr. Sima Matas on 07/02/20 at which time her test performances, clinical impressions and treatment recommendations will be reviewed in  detail. The patient understands she can contact our office should she require our assistance before this time.  Full report to follow.

## 2020-06-19 ENCOUNTER — Ambulatory Visit: Payer: 59 | Admitting: Psychology

## 2020-06-25 ENCOUNTER — Encounter: Payer: No Typology Code available for payment source | Attending: Psychology | Admitting: Psychology

## 2020-06-25 ENCOUNTER — Other Ambulatory Visit: Payer: Self-pay

## 2020-06-25 DIAGNOSIS — G44329 Chronic post-traumatic headache, not intractable: Secondary | ICD-10-CM | POA: Insufficient documentation

## 2020-06-25 DIAGNOSIS — F0781 Postconcussional syndrome: Secondary | ICD-10-CM | POA: Insufficient documentation

## 2020-06-29 NOTE — Progress Notes (Unsigned)
Neuropsychological Evaluation   Patient:  Bridget Anderson   DOB: 22-Jul-1970  MR Number: [id number]  Location: [facility] MEDICINE [facility] PHYSICAL MEDICINE AND REHABILITATION [place], [phone] [place] [address] Dept: [phone]  Start: *** End: ***  Provider/Observer:     Edgardo Roys PsyD  Chief Complaint:     No chief complaint on file.   Reason For Service:     ***  Testing Administered:  ***  Participation Level:   {BHH PARTICIPATION GEXBM:84132}  Participation Quality:  {BHH PARTICIPATION QUALITY:22265}      Behavioral Observation:  {Appearance:22683}, {BHH LEVEL OF CONSCIOUSNESS:22305}, and {BHH AFFECT:22307}.   Test Results:   ***  Neuropsychology Note  Bridget Anderson completed 240 minutes of neuropsychological testing with this provider.She was appropriately dressed and well groomed. She was partially oriented but gave incorrect year, month, day of week and month. She correctly named current [employer] but identified "Obama" as his predecessor. Mood appeared anxious and depressed. The patient did appear overtly distressed at times, especially on measures of attention and learning and memory, per behavioral observation and via self-report. She required instructions and test items to be repeated often.   She attempted to leave early on a 5-minute break and brought back by sister to complete testing per my request. Completed all assigned tasks.   Clinical Decision Making: Repeat testing battery from 04/2018. Changes were made as deemed necessary based on patient performance on testing, behavioral observations and additional pertinent factors such as those listed above.  Tests Administered:  [employer], 3rd Edition (CVLT-3); Standard Form   Test of Malingering Memory (TOMM)   Wechsler Adult Intelligence Scale, 4th Edition (WAIS-IV)  Wechsler Memory Scale, 4th  Edition (WMS-IV); Adult Battery, Select Subtests   Results:   2022:                                       Raw                 Description TOMM             Trial 1:             29/50               Below Expectation             Trial 2:             23/50               Below Expectation             Retention:        27/50               Below Expectation  2020:    Results of the TOMM are as follows:   Trial 1: 29/50, Invalid  Trail 2: 30/50, Invalid Retention: 28/50, Invalid     05/2020:   Composite Score Summary  Scale Sum of Scaled Scores Composite Score Percentile Rank 95% Conf. Interval Qualitative Description  Verbal Comprehension 21 VCI 83 13 78-89 Low Average  Perceptual Reasoning 31 PRI 102 55 96-108 Average  Working Memory 11 WMI 74 4 69-82 Borderline  Processing Speed 19 PSI 97 42 89-106 Average  Full Scale 82 FSIQ 87 19 83-91 Low Average  General Ability 52 GAI  92 30 87-97 Average      03/2018:  Composite Score Summary  Scale Sum of Scaled Scores Composite Score Percentile Rank 95% Conf. Interval Qualitative Description  Verbal Comprehension 25 VCI 91 27 86-97 Average  Perceptual Reasoning 33 PRI 105 63 99-111 Average  Working Memory 9 WMI 69 2 64-78 Extremely Low  Processing Speed 16 PSI 89 23 82-98 Low Average  Full Scale 83 FSIQ 88 21 84-92 Low Average  General Ability 58 GAI 98 45 93-103 Average       05/2020:   Verbal Comprehension Subtests Summary  Subtest Raw Score Scaled Score Percentile Rank Reference Group Scaled Score SEM  Similarities 22 8 25 9  1.04  Vocabulary 26 7 16 8  0.73  Information 7 6 9 6  0.73    03/2018:  Verbal Comprehension Subtests Summary  Subtest Raw Score Scaled Score Percentile Rank Reference Group Scaled Score SEM  Similarities 24 9 37 10 1.04  Vocabulary 32 8 25 9  0.73  Information 10 8 25 8  0.73  (Comprehension) 25 10 50 11 1.16      05/2020:  Perceptual Reasoning Subtests Summary  Subtest Raw  Score Scaled Score Percentile Rank Reference Group Scaled Score SEM  Block Design 32 8 25 7  0.95  Matrix Reasoning 18 11 63 9 0.95  Visual Puzzles 16 12 75 10 0.85     04/2018:  Perceptual Reasoning Subtests Summary  Subtest Raw Score Scaled Score Percentile Rank Reference Group Scaled Score SEM  Block Design 32 8 25 7  0.95  Matrix Reasoning 22 14 91 13 0.95  Visual Puzzles 15 11 63 10 0.85  (Figure Weights) 16 12 75 10 0.90  (Picture Completion) 11 9 37 8 1.24      05/2020:  Working Raw Score Scaled Score Percentile Rank Reference Group Scaled Score SEM  Digit Span 17 5 5 4  0.73  Arithmetic 9 6 9 6  0.90     03/2018:  Working 06/2020 Raw Score Scaled Score Percentile Rank Reference Group Scaled Score SEM  Digit Span 16 4 2 4  0.73  Arithmetic 8 5 5 5  0.90  (Letter-Number Seq.) 19 9 37 9 1.04      05/2020:  Processing Speed Subtests Summary  Subtest Raw Score Scaled Score Percentile Rank Reference Group Scaled Score SEM  Symbol Search 23 7 16 6  1.56  Coding 78 12 75 11 1.20    03/2018:  Processing Speed Subtests Summary  Subtest Raw Score Scaled Score Percentile Rank Reference Group Scaled Score SEM  Symbol Search 20 6 9 5  1.56  Coding 63 10 50 8 1.20  (Cancellation) 34 9 37 8 1.62      05/2020:         Primary Subtest Scaled Score Summary   Subtest Domain Raw Score Scaled Score Percentile Rank  Logical Memory I AM 12 4 2   Logical Memory II AM 5 3 1   Visual Reproduction I VM 25 5 5   Visual Reproduction II VM 0 1 0.1  Symbol Span VWM 22 10 50         Auditory Memory Process Score Summary   Process Score Raw Score Scaled Score Percentile Rank Cumulative Percentage (Base Rate)  LM II Recognition 18 - - 3-9%         Visual Memory Process Score Summary   Process Score Raw Score Scaled Score Percentile Rank Cumulative Percentage (Base Rate)  VR II Recognition 2 - - <=  2%    CVLT-3  (Standard Form) Index Sum of scaled scores Index score Percentile rank    Trials 1-5 Correct 21 64 1    Delayed Recall Correct 15 65 1    Total Recall Correct 44 63 1     Immediate Recall Score Raw score Scaled score Percentile rank    Trial 5 Semantic Clustering (Chance Adjusted) -0.2 8 25     Trials 1-5 Semantic Clustering (Chance Adjusted) -0.3 7 16     Trials 1-5 Serial Clustering (Chance Adjusted) 0 8 25    Trials 1-5 % Recall Primacy 26 9 37    Trials 1-5 % Recall Middle 39 8 25    Trials 1-5 % Recall Recency 35 13 84    Trials 1-5 Recall Consistency 63 5 5    Trials 1-5 Learning Slope Analysis 0.7 7 16     Trials 1-2 Learning Slope Analysis -1 4 2     Trials 2-5 Learning Slope Analysis 1.1 11 63    Trials 1-5 Recall Discriminability 0.8 2 0.4      Delayed Recall Score Raw score Scaled score Percentile rank    Short Delay Free Recall Semantic Clustering (Chance Adjusted) -0.2 7 16     Short Delay Free Recall Discriminability -0.8 1 0.1    Short Delay Cued Recall Discriminability -0.8 1 0.1    Long Delay Free Recall Discriminability 0 2 0.4    Long Delay Cued Recall Discriminability -1 2 0.4    Cued Recall Discriminability -0.9 1 0.1    Delayed Recall Discriminability -0.6 1 0.1    Total Recall Discriminability 0.1 1 0.1     Intrusion Errors Score Raw score Scaled score Percentile rank    Trials 1-5 7 4 2     Free Recall 14 3 1     Cued Recall 24 1 0.1    Delayed Recall 30 1 0.1     Repetition Errors Score Raw score Scaled score Percentile rank    Total Repetitions 13 7 16     Total Target Repetitions 10 8 25      Forced Choice Recognition Score Raw score Base rate    Number of Target Words Recalled on Immediate/Delay but Missed on Forced Choice Recognition 3 <=2.0    Number of Hits on Yes/No Recognition but Missed on Forced Choice Recognition 5 <=2.0        03/2018:  Index Score Summary  Index Sum of Scaled Scores Index Score  Percentile Rank 95% Confidence Interval Qualitative Descriptor  Auditory Memory (AMI) 19 69 2 64-77 Extremely Low  Visual Memory (VMI) 30 85 16 80-91 Low Average  Visual Working Memory (VWMI) 13 80 9 74-89 Low Average  Immediate Memory (IMI) 28 80 9 75-87 Low Average  Delayed Memory (DMI) 21 67 1 62-76 Extremely Low    Primary Subtest Scaled Score Summary  Subtest Domain Raw Score Scaled Score Percentile Rank  Logical Memory I AM 17 6 9   Logical Memory II AM 6 3 1   Verbal [employer] I AM 18 6 9   Verbal [employer] II AM 3 4 2   Designs I VM 72 11 63  Designs II VM 50 9 37  Visual Reproduction I VM 25 5 5   Visual Reproduction II VM 7 5 5   Spatial Addition VWM 7 6 9   Symbol Span VWM 15 7 16      Summary of Results:   ***  Impression/Diagnosis:   ***  Diagnosis:    No diagnosis found.   _____________________  Ilean Skill, Psy.D. Clinical Neuropsychologist

## 2020-07-02 ENCOUNTER — Encounter: Payer: No Typology Code available for payment source | Attending: Psychology | Admitting: Psychology

## 2020-07-02 ENCOUNTER — Other Ambulatory Visit: Payer: Self-pay

## 2020-07-02 DIAGNOSIS — G44329 Chronic post-traumatic headache, not intractable: Secondary | ICD-10-CM | POA: Insufficient documentation

## 2020-07-02 DIAGNOSIS — R411 Anterograde amnesia: Secondary | ICD-10-CM | POA: Insufficient documentation

## 2020-07-02 DIAGNOSIS — F0781 Postconcussional syndrome: Secondary | ICD-10-CM | POA: Insufficient documentation

## 2020-07-08 ENCOUNTER — Encounter (HOSPITAL_BASED_OUTPATIENT_CLINIC_OR_DEPARTMENT_OTHER): Payer: No Typology Code available for payment source | Admitting: Physical Medicine & Rehabilitation

## 2020-07-08 ENCOUNTER — Encounter: Payer: Self-pay | Admitting: Physical Medicine & Rehabilitation

## 2020-07-08 ENCOUNTER — Other Ambulatory Visit: Payer: Self-pay

## 2020-07-08 VITALS — BP 168/105 | HR 78 | Temp 98.7°F | Ht 66.0 in | Wt 215.6 lb

## 2020-07-08 DIAGNOSIS — G44329 Chronic post-traumatic headache, not intractable: Secondary | ICD-10-CM

## 2020-07-08 DIAGNOSIS — F0781 Postconcussional syndrome: Secondary | ICD-10-CM

## 2020-07-08 NOTE — Progress Notes (Signed)
Subjective:    Patient ID: Bridget Anderson, female    DOB: [birth year], 50 y.o.   MRN: [id number]  HPI   Ari is here in follow up of her PCS and associated symptoms. She has been doing household activities such as cleaning. Aashi says she cooks, but daughter says otherwise. She does some laundry with cues, takes out the trash.  Family has set up a schedule, calendar and cues for [facility] on a daily basis but she is not using them consistently.  When she is instructed to do something she may follow the instructions loosely.  Her headaches have been under better control although she does have one today. She drank a "yellow bull" which may have contributed to elevation here in the office today. She takes her BP's at home. Sisters tells me they run in the 140's/80's.  '  She is sleeping reasonably well. Sugars are under control.    Pain Inventory Average Pain 5 Pain Right Now 5 My pain is constant and dull  In the last 24 hours, has pain interfered with the following? General activity 0 Relation with others 5 Enjoyment of life 0 What TIME of day is your pain at its worst? varies Sleep (in general) Good  Pain is worse with: walking, bending, sitting, inactivity, standing and some activites Pain improves with: rest and medication Relief from Meds: 7  Family History  Problem Relation Age of Onset  . Hypertension Mother   . Hyperlipidemia Mother   . Hypertension Father   . Hyperlipidemia Father   . Thyroid disease Father   . Hypertension Brother   . Migraines Maternal Aunt    Social History   Socioeconomic History  . Marital status: Single    Spouse name: Not on file  . Number of children: 2  . Years of education: Not on file  . Highest education level: Associate degree: academic program  Occupational History  . Occupation: Administrator, arts  Tobacco Use  . Smoking status: Never Smoker  . Smokeless tobacco: Never Used  Vaping Use  . Vaping Use: Never used  Substance and Sexual  Activity  . Alcohol use: Yes    Comment: Rarely  . Drug use: No  . Sexual activity: Yes    Birth control/protection: Surgical  Other Topics Concern  . Not on file  Social History Narrative   Lives at home with her youngest daughter    Right handed   Drinks < 3 cups of caffeine daily   Social Determinants of Health   Financial Resource Strain: Not on file  Food Insecurity: Not on file  Transportation Needs: Not on file  Physical Activity: Not on file  Stress: Not on file  Social Connections: Not on file   Past Surgical History:  Procedure Laterality Date  . ABDOMINAL HYSTERECTOMY N/A 07/16/2013   Procedure: HYSTERECTOMY ABDOMINAL;  Surgeon: Allyn Kenner, DO;  Location: [facility];  Service: Gynecology;  Laterality: N/A;  . BILATERAL SALPINGECTOMY Bilateral 07/16/2013   Procedure: BILATERAL SALPINGECTOMY;  Surgeon: Allyn Kenner, DO;  Location: [facility];  Service: Gynecology;  Laterality: Bilateral;  . CARPAL TUNNEL RELEASE  01/16/2012   Procedure: CARPAL TUNNEL RELEASE;  Surgeon: Nita Sells, MD;  Location: [place];  Service: Orthopedics;  Laterality: Left;  . CARPAL TUNNEL RELEASE  02/20/2012   Procedure: CARPAL TUNNEL RELEASE;  Surgeon: Nita Sells, MD;  Location: [place];  Service: Orthopedics;  Laterality: Right;  . CHOLECYSTECTOMY N/A 11/21/2019  Procedure: LAPAROSCOPIC CHOLECYSTECTOMY;  Surgeon: Coralie Keens, MD;  Location: [place];  Service: General;  Laterality: N/A;  . COLONOSCOPY    . CYSTO WITH HYDRODISTENSION N/A 07/16/2013   Procedure: CYSTOSCOPY/HYDRODISTENSION;  Surgeon: Reece Packer, MD;  Location: [facility];  Service: Urology;  Laterality: N/A;  . DILATION AND CURETTAGE OF UTERUS    . FOOT SURGERY    . HYSTEROSCOPY WITH D & C  10/31/2011   Procedure: DILATATION AND CURETTAGE /HYSTEROSCOPY;  Surgeon: Allyn Kenner, DO;  Location: [facility];  Service: Gynecology;  Laterality: N/A;  .  LAPAROSCOPY N/A 07/16/2013   Procedure: LAPAROSCOPY DIAGNOSTIC;  Surgeon: Allyn Kenner, DO;  Location: [facility];  Service: Gynecology;  Laterality: N/A;  . PLANTAR FASCIA SURGERY    . PUBOVAGINAL SLING N/A 07/16/2013   Procedure: Gaynelle Arabian;  Surgeon: Reece Packer, MD;  Location: [facility];  Service: Urology;  Laterality: N/A;  . TUBAL LIGATION    . VAGINAL DELIVERY  1995,  2003   Past Surgical History:  Procedure Laterality Date  . ABDOMINAL HYSTERECTOMY N/A 07/16/2013   Procedure: HYSTERECTOMY ABDOMINAL;  Surgeon: Allyn Kenner, DO;  Location: [facility];  Service: Gynecology;  Laterality: N/A;  . BILATERAL SALPINGECTOMY Bilateral 07/16/2013   Procedure: BILATERAL SALPINGECTOMY;  Surgeon: Allyn Kenner, DO;  Location: [facility];  Service: Gynecology;  Laterality: Bilateral;  . CARPAL TUNNEL RELEASE  01/16/2012   Procedure: CARPAL TUNNEL RELEASE;  Surgeon: Nita Sells, MD;  Location: [place];  Service: Orthopedics;  Laterality: Left;  . CARPAL TUNNEL RELEASE  02/20/2012   Procedure: CARPAL TUNNEL RELEASE;  Surgeon: Nita Sells, MD;  Location: [place];  Service: Orthopedics;  Laterality: Right;  . CHOLECYSTECTOMY N/A 11/21/2019   Procedure: LAPAROSCOPIC CHOLECYSTECTOMY;  Surgeon: Coralie Keens, MD;  Location: [place];  Service: General;  Laterality: N/A;  . COLONOSCOPY    . CYSTO WITH HYDRODISTENSION N/A 07/16/2013   Procedure: CYSTOSCOPY/HYDRODISTENSION;  Surgeon: Reece Packer, MD;  Location: [facility];  Service: Urology;  Laterality: N/A;  . DILATION AND CURETTAGE OF UTERUS    . FOOT SURGERY    . HYSTEROSCOPY WITH D & C  10/31/2011   Procedure: DILATATION AND CURETTAGE /HYSTEROSCOPY;  Surgeon: Allyn Kenner, DO;  Location: [facility];  Service: Gynecology;  Laterality: N/A;  . LAPAROSCOPY N/A 07/16/2013   Procedure: LAPAROSCOPY DIAGNOSTIC;  Surgeon: Allyn Kenner, DO;  Location: [facility];  Service:  Gynecology;  Laterality: N/A;  . PLANTAR FASCIA SURGERY    . PUBOVAGINAL SLING N/A 07/16/2013   Procedure: Gaynelle Arabian;  Surgeon: Reece Packer, MD;  Location: [facility];  Service: Urology;  Laterality: N/A;  . TUBAL LIGATION    . VAGINAL DELIVERY  1995,  2003   Past Medical History:  Diagnosis Date  . Chlamydia   . Diabetes ([place])   . GERD (gastroesophageal reflux disease)   . Hyperlipidemia   . Hypertension   . Migraine   . Obstructive sleep apnea on CPAP    pt has not worn CPAP in years  . Seasonal allergies   . Wears glasses    Pulse 78   Temp 98.7 F (37.1 C)   Ht 5\' 6"  (1.676 m)   Wt 215 lb 9.6 oz (97.8 kg)   LMP 06/27/2013   SpO2 98%   BMI 34.80 kg/m   Opioid Risk Score:   Fall Risk Score:  `1  Depression screen PHQ 2/9  Depression screen [facility] 2/9 07/08/2020  10/23/2019 05/23/2018 03/07/2018 11/20/2017 05/25/2017  Decreased Interest 0 0 0 0 2 3  Down, Depressed, Hopeless 0 0 0 0 2 1  PHQ - 2 Score 0 0 0 0 4 4  Altered sleeping - - - - 3 2  Tired, decreased energy - - - - 2 3  Change in appetite - - - - 2 1  Feeling bad or failure about yourself  - - - - 3 0  Trouble concentrating - - - - 3 3  Moving slowly or fidgety/restless - - - - 2 0  Suicidal thoughts - - - - 1 0  PHQ-9 Score - - - - 20 13  Difficult doing work/chores - - - - Extremely dIfficult Not difficult at all   Review of Systems  Neurological: Positive for dizziness, light-headedness and headaches.  All other systems reviewed and are negative.      Objective:   Physical Exam  General: No acute distress HEENT: EOMI, oral membranes moist Cards: reg rate  Chest: normal effort Abdomen: Soft, NT, ND Skin: dry, intact Extremities: no edema Psych: pleasant and appropriate, in good spirits.  Neuro:  Improved attention and focus. Oriented to name, "doctor's office", not month, day, year.   recalls some recent events. Confabulates. Insight and awareness made poor.  Strength is 5 out of 5.   Cranial nerve exam is unremarkable. Balance intact .  Musculoskeletal:  normal ROM               Assessment & Plan:  1. Post concussion syndrome with ongoing headaches, memory loss, tinnitus, vestibular symptoms, insomnia and emotional lability. I see generalized improvements since our last visit.              -neuropsych testing completed.  -I reviewed some of the preliminary resutls             -not a VR candidate             -can continue aricept             -needs to take ownership of deficits.  Awareness  remains poor.  -HH SLP would be helpful to assist pt with family and compensatory techniques to boost her functional memory and cognitive skills.  2.  History of migraine headaches since her middle school years.  Patient had been seen twice by neurology here in [place].   They are better controlled with improved BP control             -topamax 25mg  qhs and then bid for chronic daily headaches             -May use Fioricet or Tylenol for breakthrough headaches 3. Uncontrolled hypertension--much improved  4. DM II--improved control  Fifteen minutes of face to face patient care time were spent during this visit. All questions were encouraged and answered.  I spoke with WC CM also today. Follow up with me in 6 mos .

## 2020-07-08 NOTE — Patient Instructions (Signed)
PLEASE FEEL FREE TO CALL OUR OFFICE WITH ANY PROBLEMS OR QUESTIONS ([phone])      

## 2020-09-21 ENCOUNTER — Encounter: Payer: Self-pay | Admitting: Psychology

## 2020-09-21 ENCOUNTER — Encounter: Payer: No Typology Code available for payment source | Attending: Psychology | Admitting: Psychology

## 2020-09-21 ENCOUNTER — Other Ambulatory Visit: Payer: Self-pay

## 2020-09-21 DIAGNOSIS — R411 Anterograde amnesia: Secondary | ICD-10-CM | POA: Diagnosis not present

## 2020-09-21 DIAGNOSIS — F0781 Postconcussional syndrome: Secondary | ICD-10-CM | POA: Diagnosis not present

## 2020-09-21 DIAGNOSIS — F0630 Mood disorder due to known physiological condition, unspecified: Secondary | ICD-10-CM | POA: Diagnosis not present

## 2020-09-21 DIAGNOSIS — G44329 Chronic post-traumatic headache, not intractable: Secondary | ICD-10-CM

## 2020-09-21 NOTE — Addendum Note (Signed)
Addended by: Edgardo Roys on: 09/21/2020 04:03 PM   Modules accepted: Level of Service
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
## 2020-09-21 NOTE — Progress Notes (Signed)
Today I provided feedback to the patient and her sister regarding the results of the current neuropsychological evaluation.  Because of timing, the formal report will likely not be in the patient's EMR for a few weeks because of my schedule being booked up.  However, today we went over the results of the neuropsychological evaluation which were extremely consistent with previous testing.  I do think that a combination of postconcussion type syndrome, potential migrainous/vascular event and emotional distress are all playing a role and will address recommendations in the formal report.

## 2020-10-07 ENCOUNTER — Other Ambulatory Visit: Payer: Self-pay | Admitting: Physical Medicine & Rehabilitation

## 2020-10-07 DIAGNOSIS — R413 Other amnesia: Secondary | ICD-10-CM

## 2020-10-12 ENCOUNTER — Telehealth: Payer: Self-pay

## 2020-10-12 NOTE — Telephone Encounter (Signed)
Erich Montane called asking for a fax for Home Health order. I do not see home health order. I do see that in patients last visit Dr. Naaman Plummer mentioned SLP but no order in.

## 2021-01-06 ENCOUNTER — Encounter
Payer: No Typology Code available for payment source | Attending: Physical Medicine & Rehabilitation | Admitting: Physical Medicine & Rehabilitation

## 2021-07-21 ENCOUNTER — Encounter: Payer: Self-pay | Admitting: Physical Medicine & Rehabilitation

## 2021-07-21 ENCOUNTER — Encounter
Payer: No Typology Code available for payment source | Attending: Physical Medicine & Rehabilitation | Admitting: Physical Medicine & Rehabilitation

## 2021-07-21 DIAGNOSIS — G43711 Chronic migraine without aura, intractable, with status migrainosus: Secondary | ICD-10-CM | POA: Diagnosis not present

## 2021-07-21 DIAGNOSIS — G44329 Chronic post-traumatic headache, not intractable: Secondary | ICD-10-CM | POA: Insufficient documentation

## 2021-07-21 DIAGNOSIS — R411 Anterograde amnesia: Secondary | ICD-10-CM | POA: Diagnosis not present
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
## 2021-07-21 NOTE — Patient Instructions (Signed)
PLEASE FEEL FREE TO CALL OUR OFFICE WITH ANY PROBLEMS OR QUESTIONS ([phone])      

## 2022-01-12 ENCOUNTER — Encounter
Payer: No Typology Code available for payment source | Attending: Physical Medicine & Rehabilitation | Admitting: Physical Medicine & Rehabilitation

## 2022-01-12 ENCOUNTER — Encounter: Payer: Self-pay | Admitting: Physical Medicine & Rehabilitation

## 2022-01-12 VITALS — BP 151/98 | HR 78 | Ht 66.0 in | Wt 216.0 lb

## 2022-01-12 DIAGNOSIS — G441 Vascular headache, not elsewhere classified: Secondary | ICD-10-CM | POA: Diagnosis present

## 2022-01-12 DIAGNOSIS — F0781 Postconcussional syndrome: Secondary | ICD-10-CM | POA: Insufficient documentation

## 2022-01-12 DIAGNOSIS — I10 Essential (primary) hypertension: Secondary | ICD-10-CM | POA: Insufficient documentation

## 2022-01-12 NOTE — Patient Instructions (Addendum)
ALWAYS FEEL FREE TO CALL OUR OFFICE WITH ANY PROBLEMS OR QUESTIONS ([phone])  **PLEASE NOTE** ALL MEDICATION REFILL REQUESTS (INCLUDING CONTROLLED SUBSTANCES) NEED TO BE MADE AT LEAST 7 DAYS PRIOR TO REFILL BEING DUE. ANY REFILL REQUESTS INSIDE THAT TIME FRAME MAY RESULT IN DELAYS IN RECEIVING YOUR PRESCRIPTION.    FOR HEADACHE CONTROL:  YOU NEED TIGHTER BLOOD PRESSURE CONTROL. THE LOWER NUMBER NEEDS TO BE LESS THAN 90   TYLENOL '500MG'$  EVERY 6 HOURS AS NEEDED IBUPROFEN '600MG'$  EVERY 8-12 HOURS AS NEEDED   STOP TOPAMAX

## 2022-01-12 NOTE — Progress Notes (Signed)
Subjective:    Patient ID: Bridget Anderson, female    DOB: [birth year], 51 y.o.   MRN: [id number]  HPI  Bridget Anderson is here in follow up of her TBI. Sister reports that things have been pretty steady at home. She does some work around the house including sweeping and mopping. She likes to re-arrange rooms to keep them "fresh". There is an aide there at home 5-6 days a week. Otherwise she has family with her providing 24 hour supervision.   She doesn't feel that she needs someone at home. She likes her "space" She has come to terms with the fact that she can't work and is somewhat more aware of her deficits.   Her headaches are still present. She states that her bp is similar to what it is today, with DBP 98. She stopped taking topamax at the beginning of this year. She will use an occasional fioricet. However, her severe headaches are now rare.      Pain Inventory Average Pain 3 Pain Right Now 3 My pain is aching  LOCATION OF PAIN  head  BOWEL Number of stools per week: 7   BLADDER Normal    Mobility ability to climb steps?  yes do you drive?  yes Do you have any goals in this area?  yes  Function employed # of hrs/week .  Neuro/Psych No problems in this area  Prior Studies Any changes since last visit?  no  Physicians involved in your care Any changes since last visit?  no   Family History  Problem Relation Age of Onset   Hypertension Mother    Hyperlipidemia Mother    Hypertension Father    Hyperlipidemia Father    Thyroid disease Father    Hypertension Brother    Migraines Maternal Aunt    Social History   Socioeconomic History   Marital status: Single    Spouse name: Not on file   Number of children: 2   Years of education: Not on file   Highest education level: Associate degree: academic program  Occupational History   Occupation: Administrator, arts  Tobacco Use   Smoking status: Never   Smokeless tobacco: Never  Vaping Use   Vaping Use: Never used   Substance and Sexual Activity   Alcohol use: Yes    Comment: Rarely   Drug use: No   Sexual activity: Yes    Birth control/protection: Surgical  Other Topics Concern   Not on file  Social History Narrative   Lives at home with her youngest daughter    Right handed   Drinks < 3 cups of caffeine daily   Social Determinants of Health   Financial Resource Strain: Not on file  Food Insecurity: Not on file  Transportation Needs: Not on file  Physical Activity: Not on file  Stress: Not on file  Social Connections: Not on file   Past Surgical History:  Procedure Laterality Date   ABDOMINAL HYSTERECTOMY N/A 07/16/2013   Procedure: HYSTERECTOMY ABDOMINAL;  Surgeon: Allyn Kenner, DO;  Location: [facility];  Service: Gynecology;  Laterality: N/A;   BILATERAL SALPINGECTOMY Bilateral 07/16/2013   Procedure: BILATERAL SALPINGECTOMY;  Surgeon: Allyn Kenner, DO;  Location: [facility];  Service: Gynecology;  Laterality: Bilateral;   CARPAL TUNNEL RELEASE  01/16/2012   Procedure: CARPAL TUNNEL RELEASE;  Surgeon: Nita Sells, MD;  Location: [facility];  Service: Orthopedics;  Laterality: Left;   CARPAL TUNNEL RELEASE  02/20/2012   Procedure: CARPAL TUNNEL RELEASE;  Surgeon: Nita Sells, MD;  Location: [facility];  Service: Orthopedics;  Laterality: Right;   CHOLECYSTECTOMY N/A 11/21/2019   Procedure: LAPAROSCOPIC CHOLECYSTECTOMY;  Surgeon: Coralie Keens, MD;  Location: [facility];  Service: General;  Laterality: N/A;   COLONOSCOPY     CYSTO WITH HYDRODISTENSION N/A 07/16/2013   Procedure: CYSTOSCOPY/HYDRODISTENSION;  Surgeon: Reece Packer, MD;  Location: [facility];  Service: Urology;  Laterality: N/A;   DILATION AND CURETTAGE OF UTERUS     FOOT SURGERY     HYSTEROSCOPY WITH D & C  10/31/2011   Procedure: DILATATION AND CURETTAGE /HYSTEROSCOPY;  Surgeon: Allyn Kenner, DO;  Location: [facility];  Service: Gynecology;  Laterality:  N/A;   LAPAROSCOPY N/A 07/16/2013   Procedure: LAPAROSCOPY DIAGNOSTIC;  Surgeon: Allyn Kenner, DO;  Location: [facility];  Service: Gynecology;  Laterality: N/A;   PLANTAR FASCIA SURGERY     PUBOVAGINAL SLING N/A 07/16/2013   Procedure: Gaynelle Arabian;  Surgeon: Reece Packer, MD;  Location: [facility];  Service: Urology;  Laterality: N/A;   TUBAL LIGATION     VAGINAL DELIVERY  1995,  2003   Past Medical History:  Diagnosis Date   Chlamydia    Diabetes ([place])    GERD (gastroesophageal reflux disease)    Hyperlipidemia    Hypertension    Migraine    Obstructive sleep apnea on CPAP    pt has not worn CPAP in years   Seasonal allergies    Wears glasses    BP (!) 151/98   Pulse 78   Ht '5\' 6"'$  (1.676 m)   Wt 216 lb (98 kg)   LMP 06/27/2013   SpO2 97%   BMI 34.86 kg/m   Opioid Risk Score:   Fall Risk Score:  `1  Depression screen [facility] 2/9     01/12/2022   11:07 AM 07/21/2021    3:43 PM 07/08/2020    2:43 PM 10/23/2019   10:32 AM 05/23/2018    3:03 PM 03/07/2018    1:20 PM 11/20/2017    2:26 PM  Depression screen PHQ 2/9  Decreased Interest 0 0 0 0 0 0 2  Down, Depressed, Hopeless 0 0 0 0 0 0 2  PHQ - 2 Score 0 0 0 0 0 0 4  Altered sleeping       3  Tired, decreased energy       2  Change in appetite       2  Feeling bad or failure about yourself        3  Trouble concentrating       3  Moving slowly or fidgety/restless       2  Suicidal thoughts       1  PHQ-9 Score       20  Difficult doing work/chores       Extremely dIfficult      Review of Systems  All other systems reviewed and are negative.     Objective:   Physical Exam  General: No acute distress.  Appears comfortable HEENT: EOMI, oral membranes moist Cards: reg rate  Chest: normal effort Abdomen: Soft, NT, ND Skin: dry, intact Extremities: no edema Psych: pleasant and appropriate, in good spirits.  Neuro: Patient with improved attention and focus today.  Still f She tends to confabulate at times  as well..  Strength is 5 out of 5.  Cranial nerve exam is unremarkable. Balance intact with standing and with gait.  Marland Kitchen  Musculoskeletal:  normal ROM. No pain               Assessment & Plan:  1. Post concussion syndrome with ongoing headaches, memory loss, tinnitus, vestibular symptoms, insomnia and emotional lability.  -Patient demonstrates ongoing issues with short-term, day-to-day memory.  She requires supervision with her daily activities given her ongoing deficits.  -really no changes today. 2.  History of migraine headaches since her middle school years.   -seem better controlled overall.    -will not resume topamax as I she will not likely take it consistently.   -see headache discussion below 3. Uncontrolled hypertension--needs improved control.  -improved control will also help her h/a.  -needs to f/u with PCP 4. DM II--per primary   20 minutes of face to face patient care time were spent during this visit. All questions were encouraged and answered.  REVIEWED WITH cm TODAY Follow up with me in 1 YEAR.

## 2022-07-13 ENCOUNTER — Encounter
Payer: No Typology Code available for payment source | Attending: Physical Medicine & Rehabilitation | Admitting: Physical Medicine & Rehabilitation

## 2022-07-13 ENCOUNTER — Encounter: Payer: Self-pay | Admitting: Physical Medicine & Rehabilitation

## 2022-07-13 VITALS — BP 143/95 | HR 66 | Ht 66.0 in

## 2022-07-13 DIAGNOSIS — F0781 Postconcussional syndrome: Secondary | ICD-10-CM

## 2022-07-13 DIAGNOSIS — I10 Essential (primary) hypertension: Secondary | ICD-10-CM

## 2022-07-13 NOTE — Patient Instructions (Addendum)
ALWAYS FEEL FREE TO CALL OUR OFFICE WITH ANY PROBLEMS OR QUESTIONS [phone])  **PLEASE NOTE** ALL MEDICATION REFILL REQUESTS (INCLUDING CONTROLLED SUBSTANCES) NEED TO BE MADE AT LEAST 7 DAYS PRIOR TO REFILL BEING DUE. ANY REFILL REQUESTS INSIDE THAT TIME FRAME MAY RESULT IN DELAYS IN RECEIVING YOUR PRESCRIPTION.    I WOULD BE WILLING TO WRITE FOR AJOVY (MONTHLY HEADACHE INJECTION) IF WORKER'S COMP IS WILLING TO APPROVE IT.

## 2022-07-13 NOTE — Progress Notes (Signed)
Subjective:    Patient ID: Bridget Anderson, female    DOB: 08-27-1970, 52 y.o.   MRN: [id number]  HPI  Bridget Anderson is here in follow up of her TBI. She says she's doing fairly well. She keeps busy at home. Someone is always with her at home. She would like to get into refurbishing furniture (she has said that to me before0. She feels that family who are home can "get on her nerves" because they're always around. I asked her what she does to stay busy and she consistently could not come up with activities. I asked her if she does house cleaning and upkeep and she told me she did..   As far as headaches are concerned she reports a moving pain along the right side of her head. It occurs daily and can wax and wane in intensity. When she rubs it, it tends to improve. Ibuprofen can help. She is taking her BP meds more consistently but we're not sure if she is taking meds every time  as she's supposed to be. Sister says a new medication was recently started by her PCP. She never received ajovy and nurtec from neurology when I reviewed her chart today. .     Pain Inventory Average Pain 4 Pain Right Now 3 My pain is sharp  LOCATION OF PAIN  head  BOWEL Number of stools per week: 7   BLADDER Normal    Mobility walk without assistance ability to climb steps?  yes do you drive?  yes Do you have any goals in this area?  no  Function Do you have any goals in this area?  no  Neuro/Psych No problems in this area  Prior Studies Any changes since last visit?  no  Physicians involved in your care Any changes since last visit?  no   Family History  Problem Relation Age of Onset   Hypertension Mother    Hyperlipidemia Mother    Hypertension Father    Hyperlipidemia Father    Thyroid disease Father    Hypertension Brother    Migraines Maternal Aunt    Social History   Socioeconomic History   Marital status: Single    Spouse name: Not on file   Number of children: 2   Years of  education: Not on file   Highest education level: Associate degree: academic program  Occupational History   Occupation: Administrator, [employer]  Tobacco Use   Smoking status: Never   Smokeless tobacco: Never  Vaping Use   Vaping Use: Never used  Substance and Sexual Activity   Alcohol use: Yes    Comment: Rarely   Drug use: No   Sexual activity: Yes    Birth control/protection: Surgical  Other Topics Concern   Not on file  Social History Narrative   Lives at home with her youngest daughter    Right handed   Drinks < 3 cups of caffeine daily   Social Determinants of Health   Financial Resource Strain: Not on file  Food Insecurity: Not on file  Transportation Needs: Not on file  Physical Activity: Not on file  Stress: Not on file  Social Connections: Not on file   Past Surgical History:  Procedure Laterality Date   ABDOMINAL HYSTERECTOMY N/A 07/16/2013   Procedure: HYSTERECTOMY ABDOMINAL;  Surgeon: Philip Aspen, DO;  Location: [facility];  Service: Gynecology;  Laterality: N/A;   BILATERAL SALPINGECTOMY Bilateral 07/16/2013   Procedure: BILATERAL SALPINGECTOMY;  Surgeon: Philip Aspen, DO;  Location: [facility];  Service: Gynecology;  Laterality: Bilateral;   CARPAL TUNNEL RELEASE  01/16/2012   Procedure: CARPAL TUNNEL RELEASE;  Surgeon: Mable Paris, MD;  Location: [facility];  Service: Orthopedics;  Laterality: Left;   CARPAL TUNNEL RELEASE  02/20/2012   Procedure: CARPAL TUNNEL RELEASE;  Surgeon: Mable Paris, MD;  Location: [facility];  Service: Orthopedics;  Laterality: Right;   CHOLECYSTECTOMY N/A 11/21/2019   Procedure: LAPAROSCOPIC CHOLECYSTECTOMY;  Surgeon: Abigail Miyamoto, MD;  Location: [facility];  Service: General;  Laterality: N/A;   COLONOSCOPY     CYSTO WITH HYDRODISTENSION N/A 07/16/2013   Procedure: CYSTOSCOPY/HYDRODISTENSION;  Surgeon: Martina Sinner, MD;  Location: [facility];  Service: Urology;   Laterality: N/A;   DILATION AND CURETTAGE OF UTERUS     FOOT SURGERY     HYSTEROSCOPY WITH D & C  10/31/2011   Procedure: DILATATION AND CURETTAGE /HYSTEROSCOPY;  Surgeon: Philip Aspen, DO;  Location: [facility];  Service: Gynecology;  Laterality: N/A;   LAPAROSCOPY N/A 07/16/2013   Procedure: LAPAROSCOPY DIAGNOSTIC;  Surgeon: Philip Aspen, DO;  Location: [facility];  Service: Gynecology;  Laterality: N/A;   PLANTAR FASCIA SURGERY     PUBOVAGINAL SLING N/A 07/16/2013   Procedure: Leonides Grills;  Surgeon: Martina Sinner, MD;  Location: [facility];  Service: Urology;  Laterality: N/A;   TUBAL LIGATION     VAGINAL DELIVERY  1995,  2003   Past Medical History:  Diagnosis Date   Chlamydia    Diabetes (HCC)    GERD (gastroesophageal reflux disease)    Hyperlipidemia    Hypertension    Migraine    Obstructive sleep apnea on CPAP    pt has not worn CPAP in years   Seasonal allergies    Wears glasses    Ht 5\' 6"  (1.676 m)   LMP 06/27/2013   BMI 34.86 kg/m   Opioid Risk Score:   Fall Risk Score:  `1  Depression screen [facility] 2/9     01/12/2022   11:07 AM 07/21/2021    3:43 PM 07/08/2020    2:43 PM 10/23/2019   10:32 AM 05/23/2018    3:03 PM 03/07/2018    1:20 PM 11/20/2017    2:26 PM  Depression screen PHQ 2/9  Decreased Interest 0 0 0 0 0 0 2  Down, Depressed, Hopeless 0 0 0 0 0 0 2  PHQ - 2 Score 0 0 0 0 0 0 4  Altered sleeping       3  Tired, decreased energy       2  Change in appetite       2  Feeling bad or failure about yourself        3  Trouble concentrating       3  Moving slowly or fidgety/restless       2  Suicidal thoughts       1  PHQ-9 Score       20  Difficult doing work/chores       Extremely dIfficult      Review of Systems  All other systems reviewed and are negative.     Objective:   Physical Exam  General: No acute distress HEENT: NCAT, EOMI, oral membranes moist Cards: reg rate  Chest: normal effort Abdomen: Soft, NT, ND Skin: dry,  intact Extremities: no edema Psych: pleasant and appropriate. Was not irritable Neuro: Patient with improved attention and focus today.  Still cannot remember  my name and struggles with recall of events from yesterday or in recent memory. Doesn't tend to confabulate as much. Strength is 5 out of 5.  Cranial nerve exam is unremarkable. normal balance and gait.  .  Musculoskeletal:  normal ROM. No pain               Assessment & Plan:  1. Post concussion syndrome with ongoing headaches, memory loss, tinnitus, vestibular symptoms, insomnia and emotional lability.  -Continued iissues with short-term, day-to-day memory.  She requires supervision with her daily activities given her ongoing deficits.  -clinically unchanged today. 2.  History of migraine headaches since her middle school years.   -still having dull daily headaches which can wax and wane.               -would benefit from a trial of ajovy. I would be willing to write for this medication.             -needs to control bp and cbg's.  3. Uncontrolled hypertension--it's improving but not there yet  -again, improved control will also help her h/a.  -needs to f/u with PCP 4. DM II--per primary   20 minutes of face to face patient care time were spent during this visit. All questions were encouraged and answered.   Follow up with me in 1 YEAR.

## 2023-06-12 LAB — COLOGUARD: COLOGUARD: NEGATIVE

## 2023-06-12 LAB — EXTERNAL GENERIC LAB PROCEDURE: COLOGUARD: NEGATIVE

## 2023-07-04 NOTE — Progress Notes (Deleted)
 Subjective:    Patient ID: Bridget Anderson, female    DOB: 24-Nov-1970, 53 y.o.   MRN: [id number]  HPI   Pain Inventory Average Pain {NUMBERS; 0-10:5044} Pain Right Now {NUMBERS; 0-10:5044} My pain is {PAIN DESCRIPTION:21022940}  LOCATION OF PAIN  ***  BOWEL Number of stools per week: *** Oral laxative use {YES/NO:21197} Type of laxative *** Enema or suppository use {YES/NO:21197} History of colostomy {YES/NO:21197} Incontinent {YES/NO:21197}  BLADDER {bladder options:24190} In and out cath, frequency *** Able to self cath {YES/NO:21197} Bladder incontinence {YES/NO:21197} Frequent urination {YES/NO:21197} Leakage with coughing {YES/NO:21197} Difficulty starting stream {YES/NO:21197} Incomplete bladder emptying {YES/NO:21197}   Mobility {MOBILITY KGM:01027253}  Function {FUNCTION:21022946}  Neuro/Psych {NEURO/PSYCH:21022948}  Prior Studies {CPRM PRIOR STUDIES:21022953}  Physicians involved in your care {CPRM PHYSICIANS INVOLVED IN YOUR CARE:21022954}   Family History  Problem Relation Age of Onset   Hypertension Mother    Hyperlipidemia Mother    Hypertension Father    Hyperlipidemia Father    Thyroid disease Father    Hypertension Brother    Migraines Maternal Aunt    Social History   Socioeconomic History   Marital status: Single    Spouse name: Not on file   Number of children: 2   Years of education: Not on file   Highest education level: Associate degree: academic program  Occupational History   Occupation: Administrator, [employer]  Tobacco Use   Smoking status: Never   Smokeless tobacco: Never  Vaping Use   Vaping status: Never Used  Substance and Sexual Activity   Alcohol use: Yes    Comment: Rarely   Drug use: No   Sexual activity: Yes    Birth control/protection: Surgical  Other Topics Concern   Not on file  Social History Narrative   Lives at home with her youngest daughter    Right handed   Drinks < 3 cups of caffeine  daily   Social  Drivers of Health   Financial Resource Strain: Not on file  Food Insecurity: Not on file  Transportation Needs: Not on file  Physical Activity: Not on file  Stress: Not on file  Social Connections: Not on file   Past Surgical History:  Procedure Laterality Date   ABDOMINAL HYSTERECTOMY N/A 07/16/2013   Procedure: HYSTERECTOMY ABDOMINAL;  Surgeon: Atlas Blank, DO;  Location: [facility];  Service: Gynecology;  Laterality: N/A;   BILATERAL SALPINGECTOMY Bilateral 07/16/2013   Procedure: BILATERAL SALPINGECTOMY;  Surgeon: Atlas Blank, DO;  Location: [facility];  Service: Gynecology;  Laterality: Bilateral;   CARPAL TUNNEL RELEASE  01/16/2012   Procedure: CARPAL TUNNEL RELEASE;  Surgeon: Derald Flattery, MD;  Location: [facility];  Service: Orthopedics;  Laterality: Left;   CARPAL TUNNEL RELEASE  02/20/2012   Procedure: CARPAL TUNNEL RELEASE;  Surgeon: Derald Flattery, MD;  Location: [facility];  Service: Orthopedics;  Laterality: Right;   CHOLECYSTECTOMY N/A 11/21/2019   Procedure: LAPAROSCOPIC CHOLECYSTECTOMY;  Surgeon: Oza Blumenthal, MD;  Location: [facility];  Service: General;  Laterality: N/A;   COLONOSCOPY     CYSTO WITH HYDRODISTENSION N/A 07/16/2013   Procedure: CYSTOSCOPY/HYDRODISTENSION;  Surgeon: Devorah Fonder, MD;  Location: [facility];  Service: Urology;  Laterality: N/A;   DILATION AND CURETTAGE OF UTERUS     FOOT SURGERY     HYSTEROSCOPY WITH D & C  10/31/2011   Procedure: DILATATION AND CURETTAGE /HYSTEROSCOPY;  Surgeon: Atlas Blank, DO;  Location: [facility];  Service: Gynecology;  Laterality: N/A;   LAPAROSCOPY  N/A 07/16/2013   Procedure: LAPAROSCOPY DIAGNOSTIC;  Surgeon: Atlas Blank, DO;  Location: [facility];  Service: Gynecology;  Laterality: N/A;   PLANTAR FASCIA SURGERY     PUBOVAGINAL SLING N/A 07/16/2013   Procedure: Gino Lais;  Surgeon: Devorah Fonder, MD;  Location: [facility];  Service: Urology;   Laterality: N/A;   TUBAL LIGATION     VAGINAL DELIVERY  1995,  2003   Past Medical History:  Diagnosis Date   Chlamydia    Diabetes (HCC)    GERD (gastroesophageal reflux disease)    Hyperlipidemia    Hypertension    Migraine    Obstructive sleep apnea on CPAP    pt has not worn CPAP in years   Seasonal allergies    Wears glasses    LMP 06/27/2013   Opioid Risk Score:   Fall Risk Score:  `1  Depression screen [facility] 2/9     07/13/2022   11:28 AM 01/12/2022   11:07 AM 07/21/2021    3:43 PM 07/08/2020    2:43 PM 10/23/2019   10:32 AM 05/23/2018    3:03 PM 03/07/2018    1:20 PM  Depression screen [facility] 2/9  Decreased Interest 0 0 0 0 0 0 0  Down, Depressed, Hopeless 0 0 0 0 0 0 0  PHQ - 2 Score 0 0 0 0 0 0 0    Review of Systems     Objective:   Physical Exam        Assessment & Plan:

## 2023-07-05 ENCOUNTER — Encounter: Payer: 59 | Admitting: Physical Medicine & Rehabilitation

## 2023-07-19 ENCOUNTER — Encounter: Payer: Self-pay | Admitting: Physical Medicine & Rehabilitation

## 2023-07-19 ENCOUNTER — Encounter: Attending: Physical Medicine & Rehabilitation | Admitting: Physical Medicine & Rehabilitation

## 2023-07-19 VITALS — BP 177/103 | HR 76 | Ht 66.0 in | Wt 218.4 lb

## 2023-07-19 DIAGNOSIS — F0781 Postconcussional syndrome: Secondary | ICD-10-CM | POA: Diagnosis not present

## 2023-07-19 DIAGNOSIS — G43711 Chronic migraine without aura, intractable, with status migrainosus: Secondary | ICD-10-CM | POA: Diagnosis not present

## 2023-07-19 NOTE — Progress Notes (Signed)
 Subjective:    Patient ID: Bridget Anderson, female    DOB: [birth year], 53 y.o.   MRN: [id number]  HPI  Bridget Anderson is here in follow-up of her postconcussion syndrome.  I last saw her a year ago.  She no longer has an aide at the house but sister says that family is with her all the time.  She has been doing fairly well at home with less safety issues as a whole.  She still struggles with her short-term memory.  She really has a hard time with names.  Does recall some information from prior days but also can confabulate as well.  She says her headaches have been under better control.  She is using Tylenol  ibuprofen  or cool compresses for pain.  She is no longer using anything prescription.  Her family doctor is following her for her blood pressure and she has been told that her blood pressure is hard to control.  Blood pressure was elevated today again in the office.  Mood has been generally positive.  Her niece is at the house with them and she seems to be a positive influence on everyone.  Pain Inventory Average Pain 6 Pain Right Now 2 My pain is dull  In the last 24 hours, has pain interfered with the following? General activity 0 Relation with others 0 Enjoyment of life 0 What TIME of day is your pain at its worst? morning , daytime, evening, and night Sleep (in general) Fair  Pain is worse with: walking, sitting, standing, and some activites Pain improves with: rest, medication, and injections Relief from Meds: 8  Family History  Problem Relation Age of Onset   Hypertension Mother    Hyperlipidemia Mother    Hypertension Father    Hyperlipidemia Father    Thyroid disease Father    Hypertension Brother    Migraines Maternal Aunt    Social History   Socioeconomic History   Marital status: Single    Spouse name: Not on file   Number of children: 2   Years of education: Not on file   Highest education level: Associate degree: academic program  Occupational History    Occupation: Administrator, [employer]  Tobacco Use   Smoking status: Never   Smokeless tobacco: Never  Vaping Use   Vaping status: Never Used  Substance and Sexual Activity   Alcohol use: Yes    Comment: Rarely   Drug use: No   Sexual activity: Yes    Birth control/protection: Surgical  Other Topics Concern   Not on file  Social History Narrative   Lives at home with her youngest daughter    Right handed   Drinks < 3 cups of caffeine  daily   Social Drivers of Health   Financial Resource Strain: Not on file  Food Insecurity: Not on file  Transportation Needs: Not on file  Physical Activity: Not on file  Stress: Not on file  Social Connections: Not on file   Past Surgical History:  Procedure Laterality Date   ABDOMINAL HYSTERECTOMY N/A 07/16/2013   Procedure: HYSTERECTOMY ABDOMINAL;  Surgeon: Atlas Blank, DO;  Location: [facility];  Service: Gynecology;  Laterality: N/A;   BILATERAL SALPINGECTOMY Bilateral 07/16/2013   Procedure: BILATERAL SALPINGECTOMY;  Surgeon: Atlas Blank, DO;  Location: [facility];  Service: Gynecology;  Laterality: Bilateral;   CARPAL TUNNEL RELEASE  01/16/2012   Procedure: CARPAL TUNNEL RELEASE;  Surgeon: Derald Flattery, MD;  [facility];  Service: Orthopedics;  Laterality: Left;  CARPAL TUNNEL RELEASE  02/20/2012   Procedure: CARPAL TUNNEL RELEASE;  Surgeon: Derald Flattery, MD;  Location: [facility];  Service: Orthopedics;  Laterality: Right;   CHOLECYSTECTOMY N/A 11/21/2019   Procedure: LAPAROSCOPIC CHOLECYSTECTOMY;  Surgeon: Oza Blumenthal, MD;  Location: [facility];  Service: General;  Laterality: N/A;   COLONOSCOPY     CYSTO WITH HYDRODISTENSION N/A 07/16/2013   Procedure: CYSTOSCOPY/HYDRODISTENSION;  Surgeon: Devorah Fonder, MD;  [facility]: [facility];  Service: Urology;  Laterality: N/A;   DILATION AND CURETTAGE OF UTERUS     FOOT SURGERY     HYSTEROSCOPY WITH D & C  10/31/2011    Procedure: DILATATION AND CURETTAGE /HYSTEROSCOPY;  Surgeon: Atlas Blank, DO;  [facility];  Service: Gynecology;  Laterality: N/A;   LAPAROSCOPY N/A 07/16/2013   Procedure: LAPAROSCOPY DIAGNOSTIC;  Surgeon: Atlas Blank, DO;  [facility];  Service: Gynecology;  Laterality: N/A;   PLANTAR FASCIA SURGERY     PUBOVAGINAL SLING N/A 07/16/2013   Procedure: Gino Lais;  Surgeon: Devorah Fonder, MD;  [facility];  Service: Urology;  Laterality: N/A;   TUBAL LIGATION     VAGINAL DELIVERY  1995,  2003   Past Surgical History:  Procedure Laterality Date   ABDOMINAL HYSTERECTOMY N/A 07/16/2013   Procedure: HYSTERECTOMY ABDOMINAL;  Surgeon: Atlas Blank, DO;  [facility];  Service: Gynecology;  Laterality: N/A;   BILATERAL SALPINGECTOMY Bilateral 07/16/2013   Procedure: BILATERAL SALPINGECTOMY;  Surgeon: Atlas Blank, DO;  [facility];  Service: Gynecology;  Laterality: Bilateral;   CARPAL TUNNEL RELEASE  01/16/2012   Procedure: CARPAL TUNNEL RELEASE;  Surgeon: Derald Flattery, MD;  Location: [facility];  Service: Orthopedics;  Laterality: Left;   CARPAL TUNNEL RELEASE  02/20/2012   Procedure: CARPAL TUNNEL RELEASE;  Surgeon: Derald Flattery, MD;  Location: [facility];  Service: Orthopedics;  Laterality: Right;   CHOLECYSTECTOMY N/A 11/21/2019   Procedure: LAPAROSCOPIC CHOLECYSTECTOMY;  Surgeon: Oza Blumenthal, MD;  Location: [facility];  Service: General;  Laterality: N/A;   COLONOSCOPY     CYSTO WITH HYDRODISTENSION N/A 07/16/2013   Procedure: CYSTOSCOPY/HYDRODISTENSION;  Surgeon: Devorah Fonder, MD;  [facility];  Service: Urology;  Laterality: N/A;   DILATION AND CURETTAGE OF UTERUS     FOOT SURGERY     HYSTEROSCOPY WITH D & C  10/31/2011   Procedure: DILATATION AND CURETTAGE /HYSTEROSCOPY;  Surgeon: Atlas Blank, DO;  [facility];  Service: Gynecology;  Laterality: N/A;    LAPAROSCOPY N/A 07/16/2013   Procedure: LAPAROSCOPY DIAGNOSTIC;  Surgeon: Atlas Blank, DO;  [facility];  Service: Gynecology;  Laterality: N/A;   PLANTAR FASCIA SURGERY     PUBOVAGINAL SLING N/A 07/16/2013   Procedure: Gino Lais;  Surgeon: Devorah Fonder, MD;  [facility];  Service: Urology;  Laterality: N/A;   TUBAL LIGATION     VAGINAL DELIVERY  1995,  2003   Past Medical History:  Diagnosis Date   Chlamydia    Diabetes (HCC)    GERD (gastroesophageal reflux disease)    Hyperlipidemia    Hypertension    Migraine    Obstructive sleep apnea on CPAP    pt has not worn CPAP in years   Seasonal allergies    Wears glasses    BP (!) 156/103 (BP Location: Left Arm, Patient Position: Sitting, Cuff Size: Large)   Pulse 76   Ht 5\' 6"  (1.676 m)  Wt 218 lb 6.4 oz (99.1 kg)   LMP 06/27/2013   SpO2 96%   BMI 35.25 kg/m   Opioid Risk Score:   Fall Risk Score:  `1  Depression screen [facility] 2/9     07/19/2023    2:57 PM 07/13/2022   11:28 AM 01/12/2022   11:07 AM 07/21/2021    3:43 PM 07/08/2020    2:43 PM 10/23/2019   10:32 AM 05/23/2018    3:03 PM  Depression screen PHQ 2/9  Decreased Interest 2 0 0 0 0 0 0  Down, Depressed, Hopeless 2 0 0 0 0 0 0  PHQ - 2 Score 4 0 0 0 0 0 0  Altered sleeping 1        Tired, decreased energy 2        Change in appetite 0        Feeling bad or failure about yourself  0        Trouble concentrating 0        Moving slowly or fidgety/restless 0        Suicidal thoughts 0        PHQ-9 Score 7        Difficult doing work/chores Somewhat difficult           Review of Systems  HENT:         Headaches  All other systems reviewed and are negative.      Objective:   Physical Exam General: No acute distress HEENT: NCAT, EOMI, oral membranes moist Cards: reg rate  Chest: normal effort Abdomen: Soft, NT, ND Skin: dry, intact Extremities: no edema Psych: pleasant and appropriate, in good spirits. Neuro: Patient with  improved attention and focus today.  Doesn't recall my name.  Has problems with day to day memory.  Confabulates still to some extent.  Strength is 5 out of 5.  Cranial nerve exam is unremarkable. normal balance and gait.  .  Musculoskeletal:  normal ROM. No pain               Assessment & Plan:  1. Post concussion syndrome with ongoing headaches, memory loss, tinnitus, vestibular symptoms, insomnia and emotional lability.  -Has ongoing short-term, day-to-day memory.  She needs supervision with her daily activities given her ongoing deficits.  -stable. 2.  History of migraine headaches since her middle school years.   -  having dull daily headaches which are generally improved             -over counter remedies are controlling sx, tylenol , ibuprofen , cool compresses              - Would avoid using ibuprofen  regularly due to effect on blood pressure 3. Uncontrolled hypertension--it's elevated today -PCP is working with it - Better blood pressure control is the key for controlling her headaches 4. DM II--per primary   20 minutes of face to face patient care time were spent during this visit. All questions were encouraged and answered.   Follow up with me prn

## 2023-07-19 NOTE — Patient Instructions (Addendum)
 ALWAYS FEEL FREE TO CALL OUR OFFICE WITH ANY PROBLEMS OR QUESTIONS [phone])  **PLEASE NOTE** ALL MEDICATION REFILL REQUESTS (INCLUDING CONTROLLED SUBSTANCES) NEED TO BE MADE AT LEAST 7 DAYS PRIOR TO REFILL BEING DUE. ANY REFILL REQUESTS INSIDE THAT TIME FRAME MAY RESULT IN DELAYS IN RECEIVING YOUR PRESCRIPTION.     AVOID IBUPROFEN  ON A REGULAR BASIS DUE TO BLOOD PRESSURE ELEVATION.    USE TYLENOL  FREELY  USE COOL COMPRESSES AS MUCH AS YOU WANT   CONTROLLING YOUR BP IS THE KEY TO MANAGING YOUR HEADACHES!!!
